# Patient Record
Sex: Female | Born: 1990 | Race: Black or African American | Hispanic: No | Marital: Single | Smoking: Current every day smoker
Health system: Southern US, Community
[De-identification: ages and names within clinical notes are randomized; demographics above are authoritative.]

## PROBLEM LIST (undated history)

## (undated) ENCOUNTER — Inpatient Hospital Stay (HOSPITAL_COMMUNITY): Payer: Self-pay

## (undated) DIAGNOSIS — Z8619 Personal history of other infectious and parasitic diseases: Secondary | ICD-10-CM

## (undated) DIAGNOSIS — A549 Gonococcal infection, unspecified: Secondary | ICD-10-CM

## (undated) DIAGNOSIS — D369 Benign neoplasm, unspecified site: Secondary | ICD-10-CM

## (undated) DIAGNOSIS — M51369 Other intervertebral disc degeneration, lumbar region without mention of lumbar back pain or lower extremity pain: Secondary | ICD-10-CM

## (undated) DIAGNOSIS — F329 Major depressive disorder, single episode, unspecified: Secondary | ICD-10-CM

## (undated) DIAGNOSIS — F419 Anxiety disorder, unspecified: Secondary | ICD-10-CM

## (undated) DIAGNOSIS — F3131 Bipolar disorder, current episode depressed, mild: Secondary | ICD-10-CM

## (undated) DIAGNOSIS — A749 Chlamydial infection, unspecified: Secondary | ICD-10-CM

## (undated) DIAGNOSIS — M542 Cervicalgia: Secondary | ICD-10-CM

## (undated) DIAGNOSIS — K429 Umbilical hernia without obstruction or gangrene: Secondary | ICD-10-CM

## (undated) DIAGNOSIS — E282 Polycystic ovarian syndrome: Secondary | ICD-10-CM

## (undated) DIAGNOSIS — M5136 Other intervertebral disc degeneration, lumbar region: Secondary | ICD-10-CM

## (undated) DIAGNOSIS — F39 Unspecified mood [affective] disorder: Secondary | ICD-10-CM

## (undated) DIAGNOSIS — F32A Depression, unspecified: Secondary | ICD-10-CM

## (undated) DIAGNOSIS — M5432 Sciatica, left side: Secondary | ICD-10-CM

## (undated) DIAGNOSIS — D279 Benign neoplasm of unspecified ovary: Secondary | ICD-10-CM

## (undated) HISTORY — DX: Polycystic ovarian syndrome: E28.2

---
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
## 2009-03-14 ENCOUNTER — Emergency Department (HOSPITAL_COMMUNITY): Admission: EM | Admit: 2009-03-14 | Discharge: 2009-03-14 | Payer: Self-pay | Admitting: Emergency Medicine

## 2009-05-22 ENCOUNTER — Emergency Department (HOSPITAL_BASED_OUTPATIENT_CLINIC_OR_DEPARTMENT_OTHER): Admission: EM | Admit: 2009-05-22 | Discharge: 2009-05-22 | Payer: Self-pay | Admitting: Emergency Medicine

## 2009-06-25 ENCOUNTER — Emergency Department (HOSPITAL_COMMUNITY): Admission: EM | Admit: 2009-06-25 | Discharge: 2009-06-25 | Payer: Self-pay | Admitting: Emergency Medicine

## 2009-08-25 ENCOUNTER — Emergency Department (HOSPITAL_COMMUNITY): Admission: EM | Admit: 2009-08-25 | Discharge: 2009-08-25 | Payer: Self-pay | Admitting: Emergency Medicine

## 2009-10-16 ENCOUNTER — Inpatient Hospital Stay (HOSPITAL_COMMUNITY): Admission: AD | Admit: 2009-10-16 | Discharge: 2009-10-16 | Payer: Self-pay | Admitting: Obstetrics and Gynecology

## 2010-01-03 ENCOUNTER — Emergency Department (HOSPITAL_COMMUNITY): Admission: EM | Admit: 2010-01-03 | Discharge: 2010-01-04 | Payer: Self-pay | Admitting: Emergency Medicine

## 2010-01-09 ENCOUNTER — Emergency Department (HOSPITAL_BASED_OUTPATIENT_CLINIC_OR_DEPARTMENT_OTHER): Admission: EM | Admit: 2010-01-09 | Discharge: 2010-01-10 | Payer: Self-pay | Admitting: Emergency Medicine

## 2010-01-12 ENCOUNTER — Emergency Department (HOSPITAL_COMMUNITY): Admission: EM | Admit: 2010-01-12 | Discharge: 2010-01-12 | Payer: Self-pay | Admitting: Emergency Medicine

## 2010-02-06 ENCOUNTER — Emergency Department (HOSPITAL_COMMUNITY): Admission: EM | Admit: 2010-02-06 | Discharge: 2010-02-06 | Payer: Self-pay | Admitting: Emergency Medicine

## 2010-03-26 ENCOUNTER — Emergency Department (HOSPITAL_COMMUNITY): Admission: EM | Admit: 2010-03-26 | Discharge: 2010-03-27 | Payer: Self-pay | Admitting: Emergency Medicine

## 2010-03-26 ENCOUNTER — Ambulatory Visit: Payer: Self-pay | Admitting: Psychiatry

## 2010-03-27 ENCOUNTER — Inpatient Hospital Stay (HOSPITAL_COMMUNITY): Admission: EM | Admit: 2010-03-27 | Discharge: 2010-04-03 | Payer: Self-pay | Admitting: Psychiatry

## 2010-07-25 ENCOUNTER — Emergency Department (HOSPITAL_BASED_OUTPATIENT_CLINIC_OR_DEPARTMENT_OTHER): Admission: EM | Admit: 2010-07-25 | Discharge: 2010-07-25 | Payer: Self-pay | Admitting: Emergency Medicine

## 2010-07-25 ENCOUNTER — Ambulatory Visit: Payer: Self-pay | Admitting: Interventional Radiology

## 2010-08-12 ENCOUNTER — Emergency Department (HOSPITAL_COMMUNITY): Admission: EM | Admit: 2010-08-12 | Discharge: 2010-08-13 | Payer: Self-pay | Admitting: Emergency Medicine

## 2010-10-23 ENCOUNTER — Emergency Department (HOSPITAL_COMMUNITY)
Admission: EM | Admit: 2010-10-23 | Discharge: 2010-10-23 | Payer: Self-pay | Source: Home / Self Care | Admitting: Emergency Medicine

## 2010-11-04 ENCOUNTER — Emergency Department (HOSPITAL_COMMUNITY)
Admission: EM | Admit: 2010-11-04 | Discharge: 2010-11-05 | Payer: Self-pay | Source: Home / Self Care | Admitting: Emergency Medicine

## 2011-01-19 LAB — WET PREP, GENITAL

## 2011-01-19 LAB — POCT URINALYSIS DIPSTICK
Bilirubin Urine: NEGATIVE
Urobilinogen, UA: 0.2 mg/dL (ref 0.0–1.0)
pH: 7 (ref 5.0–8.0)

## 2011-01-26 LAB — POCT PREGNANCY, URINE: Preg Test, Ur: NEGATIVE

## 2011-01-26 LAB — DIFFERENTIAL
Basophils Relative: 1 % (ref 0–1)
Monocytes Absolute: 0.7 10*3/uL (ref 0.1–1.0)
Neutro Abs: 8.2 10*3/uL — ABNORMAL HIGH (ref 1.7–7.7)
Neutrophils Relative %: 65 % (ref 43–77)

## 2011-01-26 LAB — BASIC METABOLIC PANEL
BUN: 6 mg/dL (ref 6–23)
Chloride: 106 mEq/L (ref 96–112)
GFR calc Af Amer: >60 mL/min (ref >60)
GFR calc non Af Amer: >60 mL/min (ref >60)
Potassium: 4.1 mEq/L (ref 3.5–5.1)
Sodium: 136 mEq/L (ref 135–145)

## 2011-01-26 LAB — RAPID URINE DRUG SCREEN, HOSP PERFORMED
Amphetamines: NOT DETECTED
Benzodiazepines: NOT DETECTED
Cocaine: NOT DETECTED
Tetrahydrocannabinol: NOT DETECTED

## 2011-01-26 LAB — URINALYSIS, ROUTINE W REFLEX MICROSCOPIC
Bilirubin Urine: NEGATIVE
Hgb urine dipstick: NEGATIVE
Specific Gravity, Urine: 1.02 (ref 1.005–1.030)
pH: 7 (ref 5.0–8.0)

## 2011-01-28 LAB — POCT PREGNANCY, URINE: Preg Test, Ur: NEGATIVE

## 2011-02-02 LAB — URINALYSIS, ROUTINE W REFLEX MICROSCOPIC
Bilirubin Urine: NEGATIVE
Nitrite: NEGATIVE
Specific Gravity, Urine: 1.007 (ref 1.005–1.030)
Urobilinogen, UA: 0.2 mg/dL (ref 0.0–1.0)
pH: 7 (ref 5.0–8.0)

## 2011-02-02 LAB — POCT I-STAT, CHEM 8
Creatinine, Ser: 0.6 mg/dL (ref 0.4–1.2)
HCT: 46 % (ref 36.0–46.0)
Hemoglobin: 15.6 g/dL — ABNORMAL HIGH (ref 12.0–15.0)
Potassium: 4.1 mEq/L (ref 3.5–5.1)
Sodium: 139 mEq/L (ref 135–145)
TCO2: 28 mmol/L (ref 0–100)

## 2011-02-10 LAB — WET PREP, GENITAL: Clue Cells Wet Prep HPF POC: NONE SEEN

## 2011-02-10 LAB — URINALYSIS, ROUTINE W REFLEX MICROSCOPIC
Bilirubin Urine: NEGATIVE
Hgb urine dipstick: NEGATIVE
Nitrite: NEGATIVE
Protein, ur: NEGATIVE mg/dL
Urobilinogen, UA: 0.2 mg/dL (ref 0.0–1.0)

## 2011-02-10 LAB — GC/CHLAMYDIA PROBE AMP, GENITAL: GC Probe Amp, Genital: NEGATIVE

## 2011-02-14 LAB — WET PREP, GENITAL
Trich, Wet Prep: NONE SEEN
Yeast Wet Prep HPF POC: NONE SEEN

## 2011-02-14 LAB — URINALYSIS, ROUTINE W REFLEX MICROSCOPIC
Ketones, ur: NEGATIVE mg/dL
Leukocytes, UA: NEGATIVE
Nitrite: NEGATIVE
Protein, ur: NEGATIVE mg/dL
Urobilinogen, UA: 0.2 mg/dL (ref 0.0–1.0)

## 2011-02-14 LAB — HERPES SIMPLEX VIRUS CULTURE: Culture: DETECTED

## 2011-02-14 LAB — PREGNANCY, URINE: Preg Test, Ur: NEGATIVE

## 2011-02-16 LAB — URINALYSIS, ROUTINE W REFLEX MICROSCOPIC
Bilirubin Urine: NEGATIVE
Glucose, UA: NEGATIVE mg/dL
Nitrite: NEGATIVE
Specific Gravity, Urine: 1.006 (ref 1.005–1.030)
pH: 7 (ref 5.0–8.0)

## 2011-02-16 LAB — PREGNANCY, URINE: Preg Test, Ur: NEGATIVE

## 2011-02-16 LAB — GLUCOSE, CAPILLARY: Glucose-Capillary: 88 mg/dL (ref 70–99)

## 2011-02-17 LAB — PREGNANCY, URINE: Preg Test, Ur: NEGATIVE

## 2011-02-17 LAB — WET PREP, GENITAL
Clue Cells Wet Prep HPF POC: NONE SEEN
Trich, Wet Prep: NONE SEEN

## 2011-02-17 LAB — URINALYSIS, ROUTINE W REFLEX MICROSCOPIC
Bilirubin Urine: NEGATIVE
Nitrite: NEGATIVE
Urobilinogen, UA: 0.2 mg/dL (ref 0.0–1.0)

## 2011-02-17 LAB — URINE MICROSCOPIC-ADD ON

## 2011-02-23 LAB — URINALYSIS, ROUTINE W REFLEX MICROSCOPIC
Glucose, UA: NEGATIVE mg/dL
Protein, ur: NEGATIVE mg/dL
Urobilinogen, UA: 0.2 mg/dL (ref 0.0–1.0)

## 2011-02-23 LAB — HERPES SIMPLEX VIRUS CULTURE

## 2011-02-23 LAB — GC/CHLAMYDIA PROBE AMP, GENITAL
Chlamydia, DNA Probe: NEGATIVE
GC Probe Amp, Genital: NEGATIVE

## 2011-02-23 LAB — URINE MICROSCOPIC-ADD ON

## 2011-02-23 LAB — WET PREP, GENITAL

## 2011-03-07 ENCOUNTER — Emergency Department (HOSPITAL_BASED_OUTPATIENT_CLINIC_OR_DEPARTMENT_OTHER)
Admission: EM | Admit: 2011-03-07 | Discharge: 2011-03-07 | Disposition: A | Discharge status: Home / Self Care | Payer: Self-pay | Attending: Emergency Medicine | Admitting: Emergency Medicine

## 2011-03-07 DIAGNOSIS — R109 Unspecified abdominal pain: Secondary | ICD-10-CM | POA: Insufficient documentation

## 2011-03-07 DIAGNOSIS — A499 Bacterial infection, unspecified: Secondary | ICD-10-CM | POA: Insufficient documentation

## 2011-03-07 DIAGNOSIS — B9689 Other specified bacterial agents as the cause of diseases classified elsewhere: Secondary | ICD-10-CM | POA: Insufficient documentation

## 2011-03-07 DIAGNOSIS — F17200 Nicotine dependence, unspecified, uncomplicated: Secondary | ICD-10-CM | POA: Insufficient documentation

## 2011-03-07 DIAGNOSIS — N760 Acute vaginitis: Secondary | ICD-10-CM | POA: Insufficient documentation

## 2011-03-07 DIAGNOSIS — N390 Urinary tract infection, site not specified: Secondary | ICD-10-CM | POA: Insufficient documentation

## 2011-03-07 DIAGNOSIS — F319 Bipolar disorder, unspecified: Secondary | ICD-10-CM | POA: Insufficient documentation

## 2011-03-07 DIAGNOSIS — N898 Other specified noninflammatory disorders of vagina: Secondary | ICD-10-CM | POA: Insufficient documentation

## 2011-03-07 LAB — URINALYSIS, ROUTINE W REFLEX MICROSCOPIC
Bilirubin Urine: NEGATIVE
Glucose, UA: NEGATIVE mg/dL
Ketones, ur: NEGATIVE mg/dL
pH: 6.5 (ref 5.0–8.0)

## 2011-03-07 LAB — URINE MICROSCOPIC-ADD ON

## 2011-03-07 LAB — WET PREP, GENITAL

## 2011-05-30 ENCOUNTER — Inpatient Hospital Stay (HOSPITAL_COMMUNITY): Payer: PRIVATE HEALTH INSURANCE

## 2011-05-30 ENCOUNTER — Encounter (HOSPITAL_COMMUNITY): Payer: Self-pay | Admitting: *Deleted

## 2011-05-30 ENCOUNTER — Inpatient Hospital Stay (HOSPITAL_COMMUNITY)
Admission: AD | Admit: 2011-05-30 | Discharge: 2011-06-02 | DRG: 761 | Disposition: A | Discharge status: Home / Self Care | Payer: PRIVATE HEALTH INSURANCE | Source: Ambulatory Visit | Attending: Obstetrics & Gynecology | Admitting: Obstetrics & Gynecology

## 2011-05-30 DIAGNOSIS — N7093 Salpingitis and oophoritis, unspecified: Secondary | ICD-10-CM

## 2011-05-30 DIAGNOSIS — N949 Unspecified condition associated with female genital organs and menstrual cycle: Secondary | ICD-10-CM

## 2011-05-30 DIAGNOSIS — N83209 Unspecified ovarian cyst, unspecified side: Secondary | ICD-10-CM

## 2011-05-30 HISTORY — DX: Anxiety disorder, unspecified: F41.9

## 2011-05-30 HISTORY — DX: Chlamydial infection, unspecified: A74.9

## 2011-05-30 HISTORY — DX: Depression, unspecified: F32.A

## 2011-05-30 HISTORY — DX: Bipolar disorder, current episode depressed, mild: F31.31

## 2011-05-30 HISTORY — DX: Major depressive disorder, single episode, unspecified: F32.9

## 2011-05-30 HISTORY — DX: Gonococcal infection, unspecified: A54.9

## 2011-05-30 LAB — DIFFERENTIAL
Basophils Absolute: 0.1 10*3/uL (ref 0.0–0.1)
Basophils Relative: 0 % (ref 0–1)
Eosinophils Absolute: 0.4 10*3/uL (ref 0.0–0.7)
Eosinophils Relative: 2 % (ref 0–5)
Monocytes Absolute: 0.8 10*3/uL (ref 0.1–1.0)

## 2011-05-30 LAB — URINALYSIS, ROUTINE W REFLEX MICROSCOPIC
Glucose, UA: NEGATIVE mg/dL
Ketones, ur: NEGATIVE mg/dL
pH: 7.5 (ref 5.0–8.0)

## 2011-05-30 LAB — CBC
HCT: 41.9 % (ref 36.0–46.0)
MCH: 28.8 pg (ref 26.0–34.0)
MCHC: 33.4 g/dL (ref 30.0–36.0)
MCV: 86.2 fL (ref 78.0–100.0)
RDW: 13.1 % (ref 11.5–15.5)

## 2011-05-30 LAB — WET PREP, GENITAL: Yeast Wet Prep HPF POC: NONE SEEN

## 2011-05-30 LAB — POCT PREGNANCY, URINE: Preg Test, Ur: NEGATIVE

## 2011-05-30 MED ORDER — ONDANSETRON HCL 4 MG/2ML IJ SOLN
4.0000 mg | Freq: Four times a day (QID) | INTRAMUSCULAR | Status: DC | PRN
  Administered 2011-05-31 – 2011-06-02 (×5): 4 mg via INTRAVENOUS
  Filled 2011-05-30 (×6): qty 2

## 2011-05-30 MED ORDER — LACTATED RINGERS IV SOLN
INTRAVENOUS | Status: DC | PRN
  Administered 2011-05-30: 20:00:00 via INTRAVENOUS

## 2011-05-30 MED ORDER — SODIUM CHLORIDE 0.9 % IJ SOLN
3.0000 mL | Freq: Two times a day (BID) | INTRAMUSCULAR | Status: DC
  Administered 2011-05-30 – 2011-06-02 (×5): 3 mL via INTRAVENOUS

## 2011-05-30 MED ORDER — DEXTROSE 5 % IV SOLN
1.0000 g | Freq: Two times a day (BID) | INTRAVENOUS | Status: DC
  Administered 2011-05-30 – 2011-06-01 (×5): 1 g via INTRAVENOUS
  Filled 2011-05-30 (×6): qty 1

## 2011-05-30 MED ORDER — ZOLPIDEM TARTRATE 5 MG PO TABS: 5.0000 mg | ORAL_TABLET | Freq: Every evening | ORAL | Status: DC | PRN

## 2011-05-30 MED ORDER — HYDROMORPHONE HCL 1 MG/ML IJ SOLN
1.0000 mg | INTRAMUSCULAR | Status: DC | PRN
  Administered 2011-05-30 – 2011-05-31 (×4): 1 mg via INTRAVENOUS
  Filled 2011-05-30 (×5): qty 1

## 2011-05-30 MED ORDER — ONDANSETRON 8 MG PO TBDP
8.0000 mg | ORAL_TABLET | Freq: Once | ORAL | Status: AC
  Administered 2011-05-30: 8 mg via ORAL
  Filled 2011-05-30: qty 1

## 2011-05-30 MED ORDER — DOXYCYCLINE HYCLATE 100 MG PO TABS
100.0000 mg | ORAL_TABLET | Freq: Two times a day (BID) | ORAL | Status: DC
  Administered 2011-05-30 – 2011-06-01 (×5): 100 mg via ORAL
  Filled 2011-05-30 (×6): qty 1

## 2011-05-30 MED ORDER — PRENATAL PLUS 27-1 MG PO TABS
1.0000 | ORAL_TABLET | Freq: Every day | ORAL | Status: DC
  Administered 2011-05-30 – 2011-06-02 (×4): 1 via ORAL
  Filled 2011-05-30 (×4): qty 1

## 2011-05-30 MED ORDER — SODIUM CHLORIDE 0.9 % IV SOLN
INTRAVENOUS | Status: DC
  Administered 2011-05-30 – 2011-05-31 (×2): via INTRAVENOUS

## 2011-05-30 MED ORDER — KETOROLAC TROMETHAMINE 60 MG/2ML IM SOLN
60.0000 mg | Freq: Once | INTRAMUSCULAR | Status: AC
  Administered 2011-05-30: 60 mg via INTRAMUSCULAR
  Filled 2011-05-30: qty 2

## 2011-05-30 MED ORDER — DIPHENHYDRAMINE HCL 25 MG PO CAPS
25.0000 mg | ORAL_CAPSULE | ORAL | Status: DC | PRN
  Administered 2011-05-30 – 2011-06-01 (×5): 25 mg via ORAL
  Filled 2011-05-30 (×5): qty 1

## 2011-05-30 NOTE — ED Notes (Signed)
Lab Tech at bedside and blood drawn for lab work.

## 2011-05-30 NOTE — Progress Notes (Signed)
PT REPORTS SHARP ABDOMINAL PAIN THIS AM TAKING MIDOL AND MOTRIN FOR PAIN RELIEF WITH MINIMAL RELIEF. Pt C/O VAGINAL BLEEDING.

## 2011-05-30 NOTE — Progress Notes (Signed)
Report called to Selena Batten, RN charge nurse [facility]

## 2011-05-30 NOTE — ED Notes (Signed)
Hope Neese RNP at bedside to discuss plan of care. Admission explained to pt.

## 2011-05-30 NOTE — Progress Notes (Signed)
Pt states she put a tampon and a peri pad on at 10 am this morning and states it was saturated in 2 hours with small blood clots. Pt placed a new peri pad at 1200,  and when RN assessed pad only spotting seen.

## 2011-05-30 NOTE — ED Provider Notes (Signed)
History   The patient states that approximately 4 am she had a severe pain in her lower abdomen followed by vaginal bleeding. She went to work later and the pain became increasingly worse. She sat down in the break room and felt her pants getting wet. When she went to the bathroom she had bled through a tampon, pad and her clothes. Her coworkers took her to the office and called 911. Pt. Arrived to [facility] via EMS and appears very uncomfortable. Current sex partner x 1 month. Hx of STI's in the past.  Chief Complaint  Patient presents with  . Vaginal Bleeding   Patient is a 20 y.o. female presenting with vaginal bleeding. The history is provided by the patient.  Vaginal Bleeding This is a new problem. The current episode started today. The problem has been gradually worsening. Associated symptoms include abdominal pain and nausea. Pertinent negatives include no chills or neck pain. The symptoms are aggravated by nothing. She has tried nothing for the symptoms.  Vaginal Bleeding This is a new problem. The current episode started today. The problem has been gradually worsening. Associated symptoms include abdominal pain, flank pain, frequency and nausea. Pertinent negatives include no chills or dysuria. The symptoms are aggravated by nothing. She has tried nothing for the symptoms.    Past Medical History  Diagnosis Date  . Anxiety   . Depression   . Chlamydia   . Gonorrhea     No past surgical history on file.  No family history on file.  History  Substance Use Topics  . Smoking status: Current Everyday Smoker -- 7.0 packs/day  . Smokeless tobacco: Not on file  . Alcohol Use: No    OB History    Grav Para Term Preterm Abortions TAB SAB Ect Mult Living   2    2  2    0      Review of Systems  Constitutional: Negative for chills and activity change.  HENT: Negative for facial swelling, neck pain and neck stiffness.   Eyes: Negative.   Gastrointestinal: Positive for nausea and  abdominal pain.  Genitourinary: Positive for frequency, flank pain and vaginal bleeding. Negative for dysuria.  Neurological: Negative.     Physical Exam  BP 107/61  Pulse 70  Resp 20  LMP 05/09/2011  Physical Exam  Nursing note and vitals reviewed. Constitutional: She is oriented to person, place, and time. She appears well-developed and well-nourished.  HENT:  Head: Normocephalic and atraumatic.  Eyes: EOM are normal.  Neck: Neck supple.  Pulmonary/Chest: Effort normal.  Abdominal: Soft. There is tenderness.  Genitourinary: Uterus is tender. Cervix exhibits motion tenderness. Right adnexum displays tenderness and fullness. Left adnexum displays tenderness and fullness.       Small amount of blood noted, vaginal vault.  Musculoskeletal: Normal range of motion.  Neurological: She is alert and oriented to person, place, and time. No cranial nerve deficit.  Skin: Skin is warm and dry.    ED Course  Procedures  MDM Patient ultrasound shows ? Dermoid 3.0 x 2.2 x 3.0cm right ovary and a complex mass 4.5 x 4.0 x 3.8 cm on the left and trace FF. I discussed lab, ultrasound and clinical findings with Dr. Marice Potter. The patient is at risk for TOA due to new sex partner of one month, history of STI's and elevated white blood cell count of 15.9. With these findings we will admit the patient for IV antibiotics and repeat her CBC in the am. Rocephin 1 gram  IV q 12 hours and doxycycline 100mg . Po bid.

## 2011-05-30 NOTE — ED Notes (Signed)
Report received from Joycelyn Man, RN with care accepted.

## 2011-05-31 LAB — COMPREHENSIVE METABOLIC PANEL
Albumin: 3.2 g/dL — ABNORMAL LOW (ref 3.5–5.2)
BUN: 10 mg/dL (ref 6–23)
Calcium: 8.9 mg/dL (ref 8.4–10.5)
Creatinine, Ser: 0.75 mg/dL (ref 0.50–1.10)
Total Protein: 7.3 g/dL (ref 6.0–8.3)

## 2011-05-31 LAB — CBC
HCT: 40.6 % (ref 36.0–46.0)
Hemoglobin: 13.2 g/dL (ref 12.0–15.0)
MCH: 28.1 pg (ref 26.0–34.0)
MCHC: 32.5 g/dL (ref 30.0–36.0)
RDW: 13.3 % (ref 11.5–15.5)

## 2011-05-31 LAB — HIV ANTIBODY (ROUTINE TESTING W REFLEX): HIV: NONREACTIVE

## 2011-05-31 LAB — DIFFERENTIAL
Basophils Relative: 0 % (ref 0–1)
Eosinophils Absolute: 0.4 10*3/uL (ref 0.0–0.7)
Monocytes Absolute: 1 10*3/uL (ref 0.1–1.0)
Monocytes Relative: 6 % (ref 3–12)

## 2011-05-31 LAB — RPR: RPR Ser Ql: NONREACTIVE

## 2011-05-31 LAB — HEPATITIS B SURFACE ANTIGEN: Hepatitis B Surface Ag: NEGATIVE

## 2011-05-31 LAB — HEPATITIS C ANTIBODY: HCV Ab: NEGATIVE

## 2011-05-31 MED ORDER — HYDROMORPHONE HCL 2 MG PO TABS
2.0000 mg | ORAL_TABLET | ORAL | Status: DC | PRN
  Administered 2011-05-31 – 2011-06-02 (×11): 2 mg via ORAL
  Filled 2011-05-31 (×12): qty 1

## 2011-05-31 NOTE — Progress Notes (Signed)
Subjective: Patient reports   Objective: I have reviewed patient's labs and notes and vitals.  She tells me she had GC last year and Chlamydia. She would like testing for "all" STIs.  Heart: rrr Lungs: CTAB Abd: NABS, ND, slightly tender with palpation.   Assessment/Plan: BL ovarian complex cysts and high WBC, suspicious for TOAs;  I will treat this nulliparous lady with IV ABX and will discharge home when pain and WBC are better.  LOS: 1 day    Amber Ray C. 05/31/2011, 5:57 AM

## 2011-06-01 LAB — GC/CHLAMYDIA PROBE AMP, GENITAL
Chlamydia, DNA Probe: NEGATIVE
GC Probe Amp, Genital: NEGATIVE

## 2011-06-01 LAB — HSV 2 ANTIBODY, IGG: HSV 2 Glycoprotein G Ab, IgG: 9.91 IV — ABNORMAL HIGH

## 2011-06-01 MED ORDER — KETOROLAC TROMETHAMINE 30 MG/ML IJ SOLN
30.0000 mg | Freq: Four times a day (QID) | INTRAMUSCULAR | Status: AC
  Administered 2011-06-01 – 2011-06-02 (×5): 30 mg via INTRAVENOUS
  Filled 2011-06-01 (×5): qty 1

## 2011-06-01 MED ORDER — NICOTINE 14 MG/24HR TD PT24
14.0000 mg | MEDICATED_PATCH | Freq: Every day | TRANSDERMAL | Status: DC
  Administered 2011-06-01 – 2011-06-02 (×2): 14 mg via TRANSDERMAL
  Filled 2011-06-01 (×3): qty 1

## 2011-06-01 MED ORDER — PROMETHAZINE HCL 25 MG/ML IJ SOLN
12.5000 mg | Freq: Once | INTRAMUSCULAR | Status: AC
  Administered 2011-06-01: 12.5 mg via INTRAVENOUS
  Filled 2011-06-01: qty 1

## 2011-06-01 MED ORDER — HYDROMORPHONE HCL 1 MG/ML IJ SOLN
1.0000 mg | Freq: Once | INTRAMUSCULAR | Status: AC
  Administered 2011-06-01: 1 mg via INTRAVENOUS
  Filled 2011-06-01: qty 1

## 2011-06-01 NOTE — Progress Notes (Signed)
Still treating pain and nausea, no fever.  VSS afebrile  NAD, pleasant Abd: mild lower abd tenderness, soft  Imp: PID TOA, mildly improved Plan: Cont ABX. Toradol, Nicotine patch

## 2011-06-01 NOTE — Progress Notes (Signed)
UR chart review completed.  

## 2011-06-02 LAB — CBC
Hemoglobin: 13.1 g/dL (ref 12.0–15.0)
MCHC: 32.8 g/dL (ref 30.0–36.0)
Platelets: 223 10*3/uL (ref 150–400)
RBC: 4.63 MIL/uL (ref 3.87–5.11)

## 2011-06-02 MED ORDER — CLINDAMYCIN PHOSPHATE 900 MG/50ML IV SOLN
900.0000 mg | Freq: Three times a day (TID) | INTRAVENOUS | Status: DC
  Administered 2011-06-02 (×2): 900 mg via INTRAVENOUS
  Filled 2011-06-02 (×3): qty 50

## 2011-06-02 MED ORDER — ACETAMINOPHEN 500 MG PO TABS: 1000.0000 mg | ORAL_TABLET | Freq: Four times a day (QID) | ORAL | Status: AC | PRN

## 2011-06-02 MED ORDER — PROMETHAZINE HCL 12.5 MG PO TABS: 12.5000 mg | ORAL_TABLET | Freq: Four times a day (QID) | ORAL | Status: AC | PRN

## 2011-06-02 MED ORDER — HYDROCODONE-IBUPROFEN 7.5-200 MG PO TABS: 1.0000 | ORAL_TABLET | Freq: Three times a day (TID) | ORAL | Status: AC | PRN

## 2011-06-02 MED ORDER — SODIUM CHLORIDE 0.9 % IV SOLN
2.0000 g | Freq: Four times a day (QID) | INTRAVENOUS | Status: DC
  Administered 2011-06-02 (×2): 2 g via INTRAVENOUS
  Filled 2011-06-02 (×4): qty 2000

## 2011-06-02 MED ORDER — NORETHINDRONE-ETH ESTRADIOL 1-35 MG-MCG PO TABS: 1.0000 | ORAL_TABLET | Freq: Every day | ORAL | Status: DC

## 2011-06-02 MED ORDER — GENTAMICIN SULFATE 40 MG/ML IJ SOLN
7.0000 mg/kg | INTRAVENOUS | Status: DC
  Administered 2011-06-02: 472 mg via INTRAVENOUS
  Filled 2011-06-02: qty 11.8

## 2011-06-02 NOTE — Progress Notes (Signed)
  Ultrasound reviewed with radiologist, findings consistent with bilateral ovarian cyst suspicious for hemorrhagic cyst. Patient report pain improved since admission but persistent. Findings explained to the patient and patient's mother and they are both agreeable with discharge home with plans to follow-up in clinic with repeat imaging. Discharge instructions provided.

## 2011-06-02 NOTE — Progress Notes (Signed)
Subjective: Patient reports pain is still severe.  The pain is 8/10 however she is asleep when the RN does her assessment and when she wakes up her pain is "severe"  Pt ambulates, voids, and is tolerating PO.    Objective: I have reviewed patient's vital signs, medications, labs and radiology results.  General: alert and cooperative GI: moderate pain in bilateral lower quadrants.  No rebound, no guarding Extremities: Homans sign is negative, no sign of DVT Vaginal Bleeding: minimal   Assessment/Plan:  20 yo female with bilateral ovarian masses on [place] and presumptive PID.  Given that pain is not improving, will change to Amp. Natasha Bence, [place].   There was a small study in up to date which found this regimen superior in some women.  Will also call rads and review films with them as they thought the lesions were more consistent with hemorrhagic cysts.  1.  Change abx as above 2.  Review films 3.  CBC 4.  SCDs   LOS: 3 days    Raevin Wierenga H. 06/02/2011, 6:15 AM

## 2011-06-02 NOTE — Discharge Summary (Signed)
Physician Discharge Summary  Patient ID: Amber Ray MRN: [id number] DOB/AGE: [birth year] 20 y.o.  Admit date: 05/30/2011 Discharge date: 06/02/2011  Admission Diagnoses: Presumed bilateral Tubo-ovarian abscess  Discharge Diagnoses: Bilateral ovarian cyst (hemorrhagic cyst suspected) Active Problems:  * No active hospital problems. *    Discharged Condition: stable  Hospital Course: Patient presented to [facility] for evaluation of pelvic pain and nausea. Radiologic imaging revealed findings suspicious for bilateral tubo-ovarian abscess. In the presence of an elevated WBC of 15 and these radiologic findings, patient was admitted and treated for presumed TOA with IV antibiotics. Throughout her hospitalization, the patient remained afebrile, WBC slowly normalized. Upon review of the ultrasound images with the radiologist, findings are most consistent with bilateral ovarian cyst consistent with hemorrhagic cysts. The patient was then found to be stable for discharge with plans to repeat the ultrasound in 6-8 weeks.  Consults: none  Significant Diagnostic Studies: as mentioned above   Treatments: antibiotics: gentamycin and ampicillin and clindamycin  Discharge Exam: Blood pressure 109/73, pulse 74, temperature 97.6 F (36.4 C), temperature source Oral, resp. rate 20, height 5\' 4"  (1.626 m), weight 86.807 kg (191 lb 6 oz), last menstrual period 05/07/2011, SpO2 97.00%. General appearance: alert and no distress GI: abnormal findings:  tendernes in lower abdomen, no rebound, no guarding  Disposition: Home or Self Care  Discharge Orders    Future Orders Please Complete By Expires   Diet Carb Modified      Discharge instructions      Comments:   You are expected to experience pain for as long as the cysts on your ovaries are present. The pain will gradually improve over time. Please alternate between the extra strength tylenol and vicoprofen to manage your pain. If the pain worsen with time,  please return to the maternity admissions unit. We will schedule a repeat ultrasound at your visit in our clinic in one month.   Increase activity slowly      Call MD for:  temperature >100.4      Call MD for:  persistant nausea and vomiting      Call MD for:  severe uncontrolled pain        Current Discharge Medication List    START taking these medications   Details  acetaminophen (TYLENOL) 500 MG tablet Take 2 tablets (1,000 mg total) by mouth every 6 (six) hours as needed for pain. Qty: 30 tablet, Refills: 2    HYDROcodone-ibuprofen (VICOPROFEN) 7.5-200 MG per tablet Take 1 tablet by mouth every 8 (eight) hours as needed for pain. Qty: 30 tablet, Refills: 0    norethindrone-ethinyl estradiol (ORTHO-NOVUM 1/35, 28,) 1-35 MG-MCG per tablet Take 1 tablet by mouth daily. Qty: 28 tablet, Refills: 4    promethazine (PHENERGAN) 12.5 MG tablet Take 1 tablet (12.5 mg total) by mouth every 6 (six) hours as needed for nausea. Qty: 30 tablet, Refills: 0      CONTINUE these medications which have NOT CHANGED   Details  APAP-Parabrom-Pyrilamine (MIDOL MAXIMUM STRENGTH PMS) 500-25-15 MG TABS Take by mouth.        STOP taking these medications     ibuprofen (ADVIL,MOTRIN) 200 MG tablet          Signed: Quinlan Vollmer 06/02/2011, 3:12 PM

## 2011-06-02 NOTE — Consult Note (Signed)
ANTIBIOTIC CONSULT NOTE - INITIAL  Pharmacy Consult for gentamcin Indication: TOA  Patient Measurements: Height: 5\' 4"  (162.6 cm) Weight: 191 lb 6 oz (86.807 kg) IBW/kg (Calculated) : 54.7  Adjusted Body Weight: 67 kg  Vital Signs: Temp: 97.8 F (36.6 C) (07/24 0552) Temp src: Oral (07/24 0552) BP: 92/60 mmHg (07/24 0552) Pulse Rate: 65  (07/24 0552) Intake/Output from previous day: 07/23 0701 - 07/24 0700 In: 960 [P.O.:960] Out: 1700 [Urine:1700] Intake/Output from this shift: I/O this shift: In: 120 [P.O.:120] Out: 100 [Urine:100]  Labs:  Basename 05/31/11 0534 05/30/11 1510  WBC 15.3* 15.9*  HGB 13.2 14.0  PLT 241 242  LABCREA -- --  CREATININE 0.75 --  CRCLEARANCE -- --   No results found for this basename: VANCOTROUGH:2,VANCOPEAK:2,VANCORANDOM:2,GENTTROUGH:2,GENTPEAK:2,GENTRANDOM:2,TOBRATROUGH:2,TOBRAPEAK:2,TOBRARND:2,AMIKACINPEAK:2,AMIKACINTROU:2,AMIKACIN:2, in the last 72 hours   Microbiology: Recent Results (from the past 720 hour(s))  WET PREP, GENITAL     Status: Abnormal   Collection Time   05/30/11  5:25 PM      Component Value Range Status Comment   Yeast, Wet Prep NONE SEEN  NONE SEEN  Final    Trich, Wet Prep NONE SEEN  NONE SEEN  Final    Clue Cells, Wet Prep NONE SEEN  NONE SEEN  Final    WBC, Wet Prep HPF POC FEW (*) NONE SEEN  Final FEW BACTERIA SEEN    Medical History: Past Medical History  Diagnosis Date  . Anxiety   . Chlamydia   . Gonorrhea   . Depression   . Bipolar affective disorder, depressed, mild     Medications:  Anti-infectives     Start     Dose/Rate Route Frequency Ordered Stop   05/30/11 2200   cefTRIAXone (ROCEPHIN) 1 g in dextrose 5 % 50 mL IVPB  Status:  Discontinued        1 g 100 mL/hr over 30 Minutes Intravenous Every 12 hours 05/30/11 1858 06/02/11 0626   05/30/11 2200   doxycycline (VIBRA-TABS) tablet 100 mg  Status:  Discontinued        100 mg Oral Every 12 hours 05/30/11 1858 06/02/11 0626          Assessment: Coverage for infection of pelvic source.  R/O TOA per RN  Goal of Therapy: Plan extended dose interval dosing q24h with 10hr post initial dose random gentamicin level to be less than or equal to 28mcg/ml.  Plan:  1.  Regimen of 480mg  gentamicin q24h 2.  Random gentamicin level to be checked 10hr after 1st dose 3.  Further gentamicin levels if therapy continued more than 4 days or as clinically indicated  Scarlett Presto 06/02/2011,6:43 AM

## 2011-06-15 ENCOUNTER — Emergency Department (HOSPITAL_COMMUNITY)
Admission: EM | Admit: 2011-06-15 | Discharge: 2011-06-15 | Disposition: A | Discharge status: Home / Self Care | Payer: Self-pay | Attending: Emergency Medicine | Admitting: Emergency Medicine

## 2011-06-15 ENCOUNTER — Encounter (HOSPITAL_COMMUNITY): Payer: Self-pay | Admitting: *Deleted

## 2011-06-15 DIAGNOSIS — N83209 Unspecified ovarian cyst, unspecified side: Secondary | ICD-10-CM | POA: Insufficient documentation

## 2011-06-15 DIAGNOSIS — F17200 Nicotine dependence, unspecified, uncomplicated: Secondary | ICD-10-CM | POA: Insufficient documentation

## 2011-06-15 DIAGNOSIS — R110 Nausea: Secondary | ICD-10-CM | POA: Insufficient documentation

## 2011-06-15 DIAGNOSIS — N949 Unspecified condition associated with female genital organs and menstrual cycle: Secondary | ICD-10-CM | POA: Insufficient documentation

## 2011-06-15 DIAGNOSIS — R1032 Left lower quadrant pain: Secondary | ICD-10-CM | POA: Insufficient documentation

## 2011-06-15 DIAGNOSIS — R1031 Right lower quadrant pain: Secondary | ICD-10-CM | POA: Insufficient documentation

## 2011-06-15 DIAGNOSIS — R109 Unspecified abdominal pain: Secondary | ICD-10-CM | POA: Insufficient documentation

## 2011-06-15 LAB — DIFFERENTIAL
Basophils Absolute: 0 10*3/uL (ref 0.0–0.1)
Basophils Relative: 0 % (ref 0–1)
Eosinophils Absolute: 0.4 10*3/uL (ref 0.0–0.7)
Monocytes Relative: 7 % (ref 3–12)
Neutro Abs: 5.4 10*3/uL (ref 1.7–7.7)
Neutrophils Relative %: 53 % (ref 43–77)

## 2011-06-15 LAB — CBC
Hemoglobin: 13.1 g/dL (ref 12.0–15.0)
MCH: 28.1 pg (ref 26.0–34.0)
MCHC: 32.8 g/dL (ref 30.0–36.0)
Platelets: 237 10*3/uL (ref 150–400)
RDW: 13.1 % (ref 11.5–15.5)

## 2011-06-15 LAB — PREGNANCY, URINE: Preg Test, Ur: NEGATIVE

## 2011-06-15 LAB — URINALYSIS, ROUTINE W REFLEX MICROSCOPIC
Glucose, UA: NEGATIVE mg/dL
Ketones, ur: NEGATIVE mg/dL
Leukocytes, UA: NEGATIVE
Nitrite: NEGATIVE
Protein, ur: NEGATIVE mg/dL

## 2011-06-15 MED ORDER — MORPHINE SULFATE 10 MG/ML IJ SOLN
8.0000 mg | Freq: Once | INTRAMUSCULAR | Status: AC
  Administered 2011-06-15: 8 mg via INTRAMUSCULAR
  Filled 2011-06-15: qty 1

## 2011-06-15 MED ORDER — OXYCODONE-ACETAMINOPHEN 5-325 MG PO TABS: 2.0000 | ORAL_TABLET | ORAL | Status: AC | PRN

## 2011-06-15 MED ORDER — ONDANSETRON 4 MG PO TBDP
4.0000 mg | ORAL_TABLET | Freq: Once | ORAL | Status: AC
  Administered 2011-06-15: 4 mg via ORAL
  Filled 2011-06-15: qty 1

## 2011-06-15 NOTE — ED Notes (Signed)
Bilateral lower abd pain, [facility] diagnosed with ovarian cyst, pain feels the same,

## 2011-06-15 NOTE — ED Notes (Signed)
Pt self ambulated out with a steady gait 

## 2011-06-15 NOTE — ED Provider Notes (Signed)
Scribed for Geoffery Lyons, MD, the patient was seen in room 8. This chart was scribed by Jannette Fogo. This patient's care was started at 18:00.    CSN: [id number] Arrival date & time: 06/15/2011  5:38 PM  Chief Complaint  Patient presents with  . Abdominal Pain   HPI Amber Ray is a 20 y.o. female who presents to the Emergency Department complaining of 2-3 weeks of lower abdominal pain. [facility] for severe pelvic pain and heavy vaginal bleeding. Radiologic imaging at that time revealed findings suspicious for bilateral tubo-ovarian abscess. While in the hospital she had an elevated WBC of 15 and received IV antibiotics. Upon review of the ultrasound images with the radiologist, findings were most consistent with bilateral ovarian cyst consistent with hemorrhagic cysts. Patient was then discharged 06/02/2011 and scheduled for a follow up ultrasound in 6-8 weeks. She thought the follow up US was scheduled for tomorrow 06/16/2011, but found out it was scheduled for 07/17/2011. Patient says as of last night the pain got worse was "out of control." She describes it as "shooting" pelvic pain with associated nausea which is exacerbated by walking and eating. Has been taking Vicodin but reports no relief. Patient denies any current vaginal bleeding, dysuria, problems urinating, or a chance of possible pregnancy. LNMP 05/07/2011. There are no other associated symptoms and no other alleviating or aggravating factors.     Past Medical History  Diagnosis Date  . Anxiety   . Chlamydia   . Gonorrhea   . Depression   . Bipolar affective disorder, depressed, mild     Past Surgical History  Procedure Date  . No past surgeries     MEDICATIONS:  Previous Medications   ACETAMINOPHEN (TYLENOL) 500 MG TABLET    Take 500 mg by mouth once as needed. For breakthrough pain  in between taking Vicodin 7.5/500    APAP-PARABROM-PYRILAMINE (MIDOL MAXIMUM STRENGTH PMS)  500-25-15 MG TABS    Take 1 tablet by mouth as needed. For cramping   HYDROCODONE-ACETAMINOPHEN (LORTAB) 7.5-500 MG PER TABLET    Take 1 tablet by mouth every 4 (four) hours as needed. For pain    HYDROCODONE-ACETAMINOPHEN (VICODIN PO)    Take 1 tablet by mouth every 4 (four) hours as needed. For pain   NORETHINDRONE-ETHINYL ESTRADIOL (ORTHO-NOVUM 1/35, 28,) 1-35 MG-MCG PER TABLET    Take 1 tablet by mouth daily.     ALLERGIES:  Allergies as of 06/15/2011  . (No Known Allergies)     Family History  Problem Relation Age of Onset  . Kidney disease Mother   . Diabetes Maternal Grandmother   . Arthritis Maternal Grandmother   . Arthritis Maternal Grandfather   . Diabetes Paternal Grandmother   . Hypertension Paternal Grandmother   . Arthritis Paternal Grandmother   . Kidney disease Paternal Grandmother   . Arthritis Paternal Grandfather     History  Substance Use Topics  . Smoking status: Current Everyday Smoker -- 7.0 packs/day  . Smokeless tobacco: Not on file  . Alcohol Use: No    OB History    Grav Para Term Preterm Abortions TAB SAB Ect Mult Living   2    2  2    0      Review of Systems  Constitutional: Negative.  Negative for fever and chills.  HENT: Negative.   Respiratory: Negative.   Cardiovascular: Negative.   Gastrointestinal: Positive for nausea and abdominal pain. Negative for diarrhea.  Genitourinary:  Positive for pelvic pain. Negative for dysuria, urgency, frequency, hematuria, vaginal bleeding and difficulty urinating.  Musculoskeletal: Negative.  Negative for back pain.  Skin: Negative.   Neurological: Negative.   Hematological: Negative.   Psychiatric/Behavioral: Negative.   All other systems reviewed and are negative.   Physical Exam  BP 116/96  Pulse 86  Temp(Src) 98.5 F (36.9 C) (Oral)  Resp 20  Ht 5\' 4"  (1.626 m)  Wt 192 lb (87.091 kg)  BMI 32.96 kg/m2  SpO2 100%  LMP 05/07/2011   Physical Exam  Constitutional: She is oriented to  person, place, and time. She appears well-developed and well-nourished. No distress.  HENT:  Head: Normocephalic and atraumatic.  Mouth/Throat: Oropharynx is clear and moist.  Eyes: Conjunctivae are normal. Pupils are equal, round, and reactive to light.  Neck: Normal range of motion. Neck supple.  Cardiovascular: Normal rate, regular rhythm, normal heart sounds and intact distal pulses.   Pulmonary/Chest: Effort normal and breath sounds normal.  Abdominal: Bowel sounds are normal. There is tenderness (LLQ, RLQ, and suprapublic. ). There is no rebound, no guarding and no CVA tenderness.  Musculoskeletal: Normal range of motion. She exhibits no edema and no tenderness.  Neurological: She is alert and oriented to person, place, and time.  Skin: Skin is warm and dry.  Psychiatric: She has a normal mood and affect.   OTHER DATA REVIEWED: Nursing notes, vital signs, and past medical records reviewed. 05/30/11  TRANSABDOMINAL AND TRANSVAGINAL ULTRASOUND OF PELVIS: IMPRESSION:   1.  Bilateral complex cystic ovarian lesions measuring 3 cm on the right and 4.5 cm on the left. Both have indeterminate, but probably benign characteristics, with differential diagnoses discussed above.  Recommend follow-up by ultrasound in 6-12 weeks.  2.  No evidence of fibroids.     DIAGNOSTIC STUDIES: Oxygen Saturation is 100% on room air, normal by my interpretation.     LABS / RADIOLOGY:  Results for orders placed during the hospital encounter of 06/15/11  URINALYSIS, ROUTINE W REFLEX MICROSCOPIC      Component Value Range   Color, Urine YELLOW  YELLOW    Appearance CLEAR  CLEAR    Specific Gravity, Urine >1.030 (*) 1.005 - 1.030    pH 6.0  5.0 - 8.0    Glucose, UA NEGATIVE  NEGATIVE (mg/dL)   Hgb urine dipstick NEGATIVE  NEGATIVE    Bilirubin Urine NEGATIVE  NEGATIVE    Ketones, ur NEGATIVE  NEGATIVE (mg/dL)   Protein, ur NEGATIVE  NEGATIVE (mg/dL)   Urobilinogen, UA 0.2  0.0 - 1.0 (mg/dL)   Nitrite  NEGATIVE  NEGATIVE    Leukocytes, UA NEGATIVE  NEGATIVE   PREGNANCY, URINE      Component Value Range   Preg Test, Ur NEGATIVE    CBC      Component Value Range   WBC 10.2  4.0 - 10.5 (K/uL)   RBC 4.67  3.87 - 5.11 (MIL/uL)   Hemoglobin 13.1  12.0 - 15.0 (g/dL)   HCT 62.9  52.8 - 41.3 (%)   MCV 85.4  78.0 - 100.0 (fL)   MCH 28.1  26.0 - 34.0 (pg)   MCHC 32.8  30.0 - 36.0 (g/dL)   RDW 24.4  01.0 - 27.2 (%)   Platelets 237  150 - 400 (K/uL)  DIFFERENTIAL      Component Value Range   Neutrophils Relative 53  43 - 77 (%)   Neutro Abs 5.4  1.7 - 7.7 (K/uL)   Lymphocytes Relative 36  12 - 46 (%)   Lymphs Abs 3.7  0.7 - 4.0 (K/uL)   Monocytes Relative 7  3 - 12 (%)   Monocytes Absolute 0.7  0.1 - 1.0 (K/uL)   Eosinophils Relative 4  0 - 5 (%)   Eosinophils Absolute 0.4  0.0 - 0.7 (K/uL)   Basophils Relative 0  0 - 1 (%)   Basophils Absolute 0.0  0.0 - 0.1 (K/uL)   US Pelvic: Unable to be obtained tonight, will schedule as outpatient.      MDM: Differential Diagnosis: TOA vs. Ovarian cyst.    IMPRESSION: Abdominal pain, unspecified site  Other and unspecified ovarian cyst    PLAN:  Home  The patient is to return the emergency department if there is any worsening of symptoms. I have reviewed the discharge instructions with the patient.    CONDITION ON DISCHARGE: Stable   MEDICATIONS GIVEN IN THE E.D.  Medications  Hydrocodone-Acetaminophen (VICODIN PO) (not administered)  HYDROcodone-acetaminophen (LORTAB) 7.5-500 MG per tablet (not administered)  acetaminophen (TYLENOL) 500 MG tablet (not administered)  oxyCODONE-acetaminophen (PERCOCET) 5-325 MG per tablet (not administered)  morphine injection 8 mg (8 mg Intramuscular Given 06/15/11 1837)  ondansetron (ZOFRAN-ODT) disintegrating tablet 4 mg (4 mg Oral Given 06/15/11 2015)     DISCHARGE MEDICATIONS: New Prescriptions   OXYCODONE-ACETAMINOPHEN (PERCOCET) 5-325 MG PER TABLET    Take 2 tablets by mouth every 4  (four) hours as needed for pain.   I personally performed the services described in this documentation, which was scribed in my presence. The recorded information has been reviewed and considered. No att. providers found   Procedures       Geoffery Lyons, MD 06/16/11 [phone]

## 2011-06-15 NOTE — ED Notes (Signed)
Pt resting in bed in room no noted distress pt drinking water stating mild nausea

## 2011-06-16 ENCOUNTER — Inpatient Hospital Stay (HOSPITAL_COMMUNITY)
Admission: AD | Admit: 2011-06-16 | Discharge: 2011-06-16 | Disposition: A | Discharge status: Home / Self Care | Payer: Medicaid Other | Source: Ambulatory Visit | Attending: Obstetrics & Gynecology | Admitting: Obstetrics & Gynecology

## 2011-06-16 ENCOUNTER — Inpatient Hospital Stay (HOSPITAL_COMMUNITY): Payer: Medicaid Other

## 2011-06-16 DIAGNOSIS — N83201 Unspecified ovarian cyst, right side: Secondary | ICD-10-CM

## 2011-06-16 DIAGNOSIS — R109 Unspecified abdominal pain: Secondary | ICD-10-CM | POA: Insufficient documentation

## 2011-06-16 DIAGNOSIS — N83209 Unspecified ovarian cyst, unspecified side: Secondary | ICD-10-CM

## 2011-06-16 MED ORDER — MORPHINE SULFATE 10 MG/ML IJ SOLN
8.0000 mg | Freq: Once | INTRAMUSCULAR | Status: AC
  Administered 2011-06-16: 17:00:00 via INTRAMUSCULAR
  Filled 2011-06-16: qty 1

## 2011-06-16 MED ORDER — NORGESTIMATE-ETH ESTRADIOL 0.25-35 MG-MCG PO TABS: 1.0000 | ORAL_TABLET | Freq: Every day | ORAL | Status: DC

## 2011-06-16 MED ORDER — OXYCODONE-ACETAMINOPHEN 5-325 MG PO TABS: 2.0000 | ORAL_TABLET | ORAL | Status: AC | PRN

## 2011-06-16 MED ORDER — OXYCODONE-ACETAMINOPHEN 5-325 MG PO TABS: 2.0000 | ORAL_TABLET | ORAL | Status: DC | PRN

## 2011-06-16 MED ORDER — DICLOFENAC SODIUM 75 MG PO TBEC: 75.0000 mg | DELAYED_RELEASE_TABLET | Freq: Two times a day (BID) | ORAL | Status: DC

## 2011-06-16 MED ORDER — DIPHENHYDRAMINE HCL 25 MG PO CAPS
25.0000 mg | ORAL_CAPSULE | Freq: Once | ORAL | Status: AC
  Administered 2011-06-16: 25 mg via ORAL
  Filled 2011-06-16: qty 1

## 2011-06-16 NOTE — ED Provider Notes (Signed)
Arrival date & time: 06/16/2011  4:15 PM  Chief Complaint  Patient presents with  . Abdominal Pain   Abdominal Pain Associated symptoms include nausea. Pertinent negatives include no diarrhea, dysuria, fever, frequency or hematuria.  Patient is a 20 y.o. female presenting with abdominal pain.  Abdominal Pain The primary symptoms of the illness include abdominal pain and nausea. The primary symptoms of the illness do not include fever, diarrhea, dysuria or vaginal bleeding.  Symptoms associated with the illness do not include chills, urgency, hematuria, frequency or back pain.   Amber Ray is a 20 y.o. female who [facility] lower [facility] pain. [facility] for severe pelvic pain and heavy vaginal bleeding. Radiologic imaging at that time revealed findings suspicious for bilateral tubo-ovarian abscess. While in the hospital she had an elevated WBC of 15 and received IV antibiotics. Upon review of the ultrasound images with the radiologist, findings were most consistent with bilateral ovarian cyst consistent with hemorrhagic cysts. Patient was then discharged 06/02/2011 and scheduled for a follow up ultrasound in 6-8 weeks. She thought the follow up US was scheduled for tomorrow 06/16/2011, but found out it was scheduled for 07/17/2011. States that pain has been ongoing since discharge, has never gotten any better. Patient says as of two nights ago the pain got worse was "out of control." She describes it as "shooting" pelvic pain with associated nausea which is exacerbated by walking and eating. Has been taking Vicodin but reports no relief. She was seen at [facility] ED last night for the same c/o - was not able to have an u/s at that time, but received Morphine and had some pain relief. Was given rx for Percocet, but did not get filled today. Patient denies any current vaginal bleeding, dysuria, problems urinating, or a chance  of possible pregnancy. LNMP 05/07/2011. There are no other associated symptoms and no other alleviating or aggravating factors.     Past Medical History  Diagnosis Date  . Anxiety   . Chlamydia   . Gonorrhea   . Depression   . Bipolar affective disorder, depressed, mild     Past Surgical History  Procedure Date  . No past surgeries     MEDICATIONS:  Previous Medications   ACETAMINOPHEN (TYLENOL) 500 MG TABLET    Take 500 mg by mouth once as needed. For breakthrough pain  in between taking Vicodin 7.5/500    APAP-PARABROM-PYRILAMINE (MIDOL MAXIMUM STRENGTH PMS) 500-25-15 MG TABS    Take 1 tablet by mouth as needed. For cramping   HYDROCODONE-ACETAMINOPHEN (LORTAB) 7.5-500 MG PER TABLET    Take 1 tablet by mouth every 4 (four) hours as needed. For pain    HYDROCODONE-ACETAMINOPHEN (VICODIN PO)    Take 1 tablet by mouth every 4 (four) hours as needed. For pain   NORETHINDRONE-ETHINYL ESTRADIOL (ORTHO-NOVUM 1/35, 28,) 1-35 MG-MCG PER TABLET    Take 1 tablet by mouth daily.   OXYCODONE-ACETAMINOPHEN (PERCOCET) 5-325 MG PER TABLET    Take 2 tablets by mouth every 4 (four) hours as needed for pain.     ALLERGIES:  Allergies as of 06/16/2011  . (No Known Allergies)     Family History  Problem Relation Age of Onset  . Kidney disease Mother   . Diabetes Maternal Grandmother   . Arthritis Maternal Grandmother   . Arthritis Maternal Grandfather   . Diabetes Paternal Grandmother   . Hypertension Paternal Grandmother   .  Arthritis Paternal Grandmother   . Kidney disease Paternal Grandmother   . Arthritis Paternal Grandfather     History  Substance Use Topics  . Smoking status: Current Everyday Smoker -- 7.0 packs/day  . Smokeless tobacco: Not on file  . Alcohol Use: No    OB History    Grav Para Term Preterm Abortions TAB SAB Ect Mult Living   2    2  2    0      Review of Systems  Constitutional: Negative.  Negative for fever and chills.  HENT: Negative.   Respiratory:  Negative.   Cardiovascular: Negative.   Gastrointestinal: Positive for nausea and abdominal pain. Negative for diarrhea.  Genitourinary: Positive for pelvic pain. Negative for dysuria, urgency, frequency, hematuria, vaginal bleeding and difficulty urinating.  Musculoskeletal: Negative.  Negative for back pain.  Skin: Negative.   Neurological: Negative.   Hematological: Negative.   Psychiatric/Behavioral: Negative.   All other systems reviewed and are negative.   Physical Exam  BP 116/96  Pulse 86  Temp(Src) 98.5 F (36.9 C) (Oral)  Resp 20  Ht 5\' 4"  (1.626 m)  Wt 192 lb (87.091 kg)  BMI 32.96 kg/m2  SpO2 100%  LMP 05/07/2011   Physical Exam  Nursing note and vitals reviewed. Constitutional: She is oriented to person, place, and time. She appears well-developed and well-nourished. No distress.  Cardiovascular: Normal rate.   Pulmonary/Chest: Effort normal.  Abdominal: There is tenderness (LLQ, RLQ, and suprapublic. ). There is no rebound, no guarding and no CVA tenderness.  Musculoskeletal: Normal range of motion.  Neurological: She is alert and oriented to person, place, and time.  Skin: Skin is warm and dry.  Psychiatric: She has a normal mood and affect.      LABS / RADIOLOGY:  Results for orders placed during the hospital encounter of 06/15/11  URINALYSIS, ROUTINE W REFLEX MICROSCOPIC      Component Value Range   Color, Urine YELLOW  YELLOW    Appearance CLEAR  CLEAR    Specific Gravity, Urine >1.030 (*) 1.005 - 1.030    pH 6.0  5.0 - 8.0    Glucose, UA NEGATIVE  NEGATIVE (mg/dL)   Hgb urine dipstick NEGATIVE  NEGATIVE    Bilirubin Urine NEGATIVE  NEGATIVE    Ketones, ur NEGATIVE  NEGATIVE (mg/dL)   Protein, ur NEGATIVE  NEGATIVE (mg/dL)   Urobilinogen, UA 0.2  0.0 - 1.0 (mg/dL)   Nitrite NEGATIVE  NEGATIVE    Leukocytes, UA NEGATIVE  NEGATIVE   PREGNANCY, URINE      Component Value Range   Preg Test, Ur NEGATIVE    CBC      Component Value Range   WBC  10.2  4.0 - 10.5 (K/uL)   RBC 4.67  3.87 - 5.11 (MIL/uL)   Hemoglobin 13.1  12.0 - 15.0 (g/dL)   HCT 16.1  09.6 - 04.5 (%)   MCV 85.4  78.0 - 100.0 (fL)   MCH 28.1  26.0 - 34.0 (pg)   MCHC 32.8  30.0 - 36.0 (g/dL)   RDW 40.9  81.1 - 91.4 (%)   Platelets 237  150 - 400 (K/uL)  DIFFERENTIAL      Component Value Range   Neutrophils Relative 53  43 - 77 (%)   Neutro Abs 5.4  1.7 - 7.7 (K/uL)   Lymphocytes Relative 36  12 - 46 (%)   Lymphs Abs 3.7  0.7 - 4.0 (K/uL)   Monocytes Relative 7  3 -  12 (%)   Monocytes Absolute 0.7  0.1 - 1.0 (K/uL)   Eosinophils Relative 4  0 - 5 (%)   Eosinophils Absolute 0.4  0.0 - 0.7 (K/uL)   Basophils Relative 0  0 - 1 (%)   Basophils Absolute 0.0  0.0 - 0.1 (K/uL)   US Transvaginal Non-ob  05/30/2011  *RADIOLOGY REPORT*  Clinical Data: Right-sided pelvic pain.  Vaginal bleeding.  LMP 05/07/2011.  TRANSABDOMINAL AND TRANSVAGINAL ULTRASOUND OF PELVIS  Technique:  Both transabdominal and transvaginal ultrasound examinations of the pelvis were performed.  Transabdominal technique was performed for global imaging of the pelvis including uterus, ovaries, adnexal regions, and pelvic cul-de-sac.  It was necessary to proceed with endovaginal exam following the transabdominal exam to visualize the endometrium and bilateral adnexal cystic masses.  Comparison:  CT on 01/17/2007  Findings: Uterus:  7.4 x 3.3 x 4.8 cm.  No fibroids or other uterine masses identified.  Endometrium:  Double layer thickness measures 7 mm transvaginally. Minimal fluid noted in the lower uterine segment, which is of uncertain etiology and significance.  Right ovary:  4.6 x 2.4 x 3.7 cm.  A complex lesion with cystic and solid hyperechoic elements areseen measuring 3.0 x 2.2 x 3.0 cm. No definite internal blood flow is seen.  This could represent an ovarian dermoid or hemorrhagic cyst.  Left ovary:  6.2 x 4.7 x 4.9 cm.  A complex cystic lesion is seen which has multiple thin internal septations and  reticular echoes. This measures 4.5 x 4.0 x 3.8 cm, and shows no evidence of internal blood flow.  Differential diagnosis includes hemorrhagic cyst, endometrioma, or less likely cystic neoplasm.  Other findings:  No free fluid.  IMPRESSION:  1.  Bilateral complex cystic ovarian lesions measuring 3 cm on the right and 4.5 cm on the left. Both have indeterminate, but probably benign characteristics, with differential diagnoses discussed above.  Recommend follow-up by ultrasound in 6-12 weeks. 2.  No evidence of fibroids.  This recommendation follows the consensus statement:  Management of Asymptomatic Ovarian and Other Adnexal Cysts Imaged at [place]:  Society of Radiologists in Ultrasound Consensus Conference Statement. Radiology 2010; [phone].  Available online at: [email or web address].[email or web address].info/.  Original Report Authenticated By: Danae Orleans, M.D.   Pt states that they gave her Morphine last night and it worked well.  However, she says she gets itching about 45 minutes after she has been given with Morphine or Dilaudid and they always give her Benadryl, which helps.  She says the PO works better than the injection.  Pt states that she can go on to ultrasound and will be OK to get her Benadryl when she returns.  Pt states she had an appointment at the GYN clinic on 8/7 for follow up and decision to be made about surgery- she was to have ultrasound to re- evaluate. Pt then was told that her appointment was not 8/7 but 9/7.   Pt has not started her BCP because she does not have insurance and cannot afford the pills, which were $30.       *RADIOLOGY REPORT*  Clinical Data: Ovarian cysts. Worsening pain.  TRANSVAGINAL ULTRASOUND OF PELVIS  Technique: Transvaginal ultrasound examination of the pelvis was  performed including evaluation of the uterus, ovaries, adnexal  regions, and pelvic cul-de-sac.  Comparison: 05/30/2011  Findings:  Uterus: Measures 7.6 x 3.1 x 4.1 cm and appears  unremarkable.  Endometrium: Measures 1.3 cm, potentially with some blood products  seen along the endometrium, query  late secretory phase. The  patient had a negative pregnancy test yesterday.  Right ovary: Measures 4.5 x 2.6 x 3.2 cm and contains a central  high echogenicity component with marginal low echogenicity  measuring 2.8 x 2.4 x 2.8 cm. This is a very similar morphology  and appearance to the prior exam, but does not appeared have  internal flow on power Doppler evaluation. There does appear to be  some power Doppler flow in the adjacent ovarian parenchyma.  Left ovary: Measures 4.9 x 2.5 x 3.0 cm, with resolution of the  previously seen cystic lesion.  Other Findings: There is a trace amount of free pelvic fluid.  IMPRESSION:  1. Stable appearance of central lesion in the right ovary. The  higher echogenicity component could conceivably represent a  dermoid, although no fatty component of the right ovary was visible  on the prior CT scan from 01/17/2007. A complex/hemorrhagic cystic  lesion could have this appearance, although the stable appearance  over the past 2 weeks would be slightly unusual. MRI of the pelvis  could provide more definitive characterization, if clinically  warranted.  2. Resolution of the previous left ovarian lesion, although the  left ovary remains mildly prominent.  3. Slight prominence of the endometrial tissues, suspicious for  late secretory phase.  Original Report Authenticated By: Dellia Cloud, M.D.  Discussed with Dr. Macon Large- ultrasound showed improvement- pt can go home with percocet and Diclofenac and f/u in GYN clinic as scheduled; pt to start BCP- will change to Sprintec  MEDICATIONS GIVEN IN THE E.D.  Medications  morphine injection 8 mg (not administered)     DISCHARGE MEDICATIONS: New Prescriptions   No medications on file    Procedures

## 2011-06-16 NOTE — Progress Notes (Signed)
Pt states she has 2 ovarian cysts one on each side.  that causes her extreme pain . Pt states she has an abcess on her left side on the cyst. Pt states the pain is worse on that side. Pt  Can't work with this pain.Pt was told to follow up.Pt states called yesterday to see if she could come in early and was told that she didn't  Have an appointment until September.Pt visitor states that there is no way patient can wait until Sept.

## 2011-06-16 NOTE — Progress Notes (Signed)
Pt states she was recently hospitalized for ovarian cysts for about one week. Was scheduled for a recheck at the [facility] and thought her appointment was 8-6 but when she called it is not until September. Pt states she could not wait until then due to the pain and went to [facility] ER last night. Was given IM Morphine, states the [facility] is no longer helping. Instructed at [facility] to come to [facility] today.

## 2011-07-17 ENCOUNTER — Ambulatory Visit (INDEPENDENT_AMBULATORY_CARE_PROVIDER_SITE_OTHER): Payer: Self-pay | Admitting: Obstetrics & Gynecology

## 2011-07-17 ENCOUNTER — Encounter: Payer: Self-pay | Admitting: Obstetrics & Gynecology

## 2011-07-17 VITALS — BP 118/78 | HR 93 | Temp 97.1°F | Wt 193.4 lb

## 2011-07-17 DIAGNOSIS — R1033 Periumbilical pain: Secondary | ICD-10-CM

## 2011-07-17 DIAGNOSIS — R109 Unspecified abdominal pain: Secondary | ICD-10-CM

## 2011-07-17 DIAGNOSIS — N83209 Unspecified ovarian cyst, unspecified side: Secondary | ICD-10-CM

## 2011-07-17 MED ORDER — NAPROXEN 500 MG PO TABS: 500.0000 mg | ORAL_TABLET | Freq: Two times a day (BID) | ORAL | Status: DC

## 2011-07-17 NOTE — Progress Notes (Signed)
Subjective: Since discharge pt has been having continued intermittent lower abdominal pain, more on the left than the right.  Currently 2-3 days per week.  Severity is variable.  Did visit ED on 8/7 and had US performed at that time.  This showed resolution of left sided cyst but continued presence of right sided one.  For the past two weeks she has been having some pain with intercourse.  Pain is lower abdominal, bilateral, and is intermittent.  LMP 8/20.  Has been having periods that are normal for her since discharge.  Pt is also worried about a hernia as her umbilicus has been bulging out some of the time for the past two weeks..  When it does this, it is very tender but no erythema, constipation or diarrhea.  Objective:  Filed Vitals:   07/17/11 1023  BP: 118/78  Pulse: 93  Temp: 97.1 F (36.2 C)   Gen: NAD Abd: Superior portion of the umbilicus has slight defect in the fascia.  This area is very tender but there is no erythema.  Bilateral lower abdominal tenderness but no masses. Pelvic: Normal external genitalia.  Normal vaginal mucosa.  No significant discharge.  Normal cervix.  No cervical motion tenderness.  Mild bilateral adnexal tenderness but no masses.  Normal uterus.  Fundus firm.  Assessment/Plan: 1) Will obtain repeat abdominal imaging as per #2 to evaluate for resolution of cysts.  Naproxen scheduled for the next six weeks.  RTC 6 weeks. 2) Umbilical findings are slightly concerning for hernia.  Pt does not have a regular PCP.  Will obtain CT abd/pelvis to evaluate for hernia.

## 2011-07-17 NOTE — Patient Instructions (Signed)
It was great to see you today! I want you to take Naproxen 500mg  two times daily every day for the next 6 weeks. We will be getting a CT scan of your abdomen and pelvis to look at your hernia and at your ovaries. Come back to see [place] in about 6 weeks so we can see how you are doing.

## 2011-07-27 ENCOUNTER — Telehealth: Payer: Self-pay | Admitting: *Deleted

## 2011-07-27 NOTE — Telephone Encounter (Signed)
Pt left message stating that she needs an order for a CT scan @ [facility] radiology to re-evaluate her cyst.

## 2011-07-28 NOTE — Telephone Encounter (Signed)
Called pt- informed of CT scan appt on 08/03/11 @ 1030 @ [facility]. She will need to pick up her contrast by Friday of this week. She will receive instructions for when to drink the contrast when she picks it up from radiology @ [facility]. Pt voiced understanding.

## 2011-08-03 ENCOUNTER — Other Ambulatory Visit (HOSPITAL_COMMUNITY): Payer: Medicaid Other

## 2011-08-03 LAB — BASIC METABOLIC PANEL
Calcium: 8.9
Glucose, Bld: 94
Sodium: 135

## 2011-08-03 LAB — URINALYSIS, ROUTINE W REFLEX MICROSCOPIC
Bilirubin Urine: NEGATIVE
Nitrite: NEGATIVE
Specific Gravity, Urine: <1.005 — ABNORMAL LOW
Urobilinogen, UA: 0.2
pH: 5.5

## 2011-08-03 LAB — PREGNANCY, URINE: Preg Test, Ur: NEGATIVE

## 2011-08-03 LAB — CBC
Hemoglobin: 13.5
Platelets: 233
RDW: 12.7
WBC: 10.6

## 2011-08-03 LAB — DIFFERENTIAL
Basophils Absolute: 0.1
Lymphocytes Relative: 19 — ABNORMAL LOW
Lymphs Abs: 2
Monocytes Absolute: 1
Neutro Abs: 7.4

## 2011-08-06 ENCOUNTER — Inpatient Hospital Stay (HOSPITAL_COMMUNITY): Admission: RE | Admit: 2011-08-06 | Payer: Medicaid Other | Source: Ambulatory Visit

## 2011-08-10 DIAGNOSIS — D279 Benign neoplasm of unspecified ovary: Secondary | ICD-10-CM

## 2011-08-10 HISTORY — DX: Benign neoplasm of unspecified ovary: D27.9

## 2011-08-13 ENCOUNTER — Inpatient Hospital Stay (INDEPENDENT_AMBULATORY_CARE_PROVIDER_SITE_OTHER)
Admission: RE | Admit: 2011-08-13 | Discharge: 2011-08-13 | Disposition: A | Discharge status: Home / Self Care | Payer: Medicaid Other | Source: Ambulatory Visit | Attending: Emergency Medicine | Admitting: Emergency Medicine

## 2011-08-13 ENCOUNTER — Other Ambulatory Visit: Payer: Self-pay | Admitting: Obstetrics & Gynecology

## 2011-08-13 DIAGNOSIS — R05 Cough: Secondary | ICD-10-CM

## 2011-08-13 DIAGNOSIS — R197 Diarrhea, unspecified: Secondary | ICD-10-CM

## 2011-08-13 DIAGNOSIS — R102 Pelvic and perineal pain: Secondary | ICD-10-CM

## 2011-08-14 LAB — URINALYSIS, ROUTINE W REFLEX MICROSCOPIC
Nitrite: NEGATIVE
Specific Gravity, Urine: 1.015
Urobilinogen, UA: 0.2

## 2011-08-14 LAB — URINE MICROSCOPIC-ADD ON

## 2011-08-14 LAB — PREGNANCY, URINE: Preg Test, Ur: NEGATIVE

## 2011-08-24 ENCOUNTER — Ambulatory Visit (HOSPITAL_COMMUNITY)
Admission: RE | Admit: 2011-08-24 | Discharge: 2011-08-24 | Disposition: A | Discharge status: Home / Self Care | Payer: Medicaid Other | Source: Ambulatory Visit | Attending: Obstetrics & Gynecology | Admitting: Obstetrics & Gynecology

## 2011-08-24 DIAGNOSIS — K429 Umbilical hernia without obstruction or gangrene: Secondary | ICD-10-CM | POA: Insufficient documentation

## 2011-08-24 DIAGNOSIS — R197 Diarrhea, unspecified: Secondary | ICD-10-CM | POA: Insufficient documentation

## 2011-08-24 DIAGNOSIS — R112 Nausea with vomiting, unspecified: Secondary | ICD-10-CM | POA: Insufficient documentation

## 2011-08-24 DIAGNOSIS — R102 Pelvic and perineal pain: Secondary | ICD-10-CM

## 2011-08-24 DIAGNOSIS — D279 Benign neoplasm of unspecified ovary: Secondary | ICD-10-CM | POA: Insufficient documentation

## 2011-08-24 MED ORDER — IOHEXOL 300 MG/ML  SOLN: 100.0000 mL | Freq: Once | INTRAMUSCULAR | Status: AC | PRN

## 2011-09-18 ENCOUNTER — Ambulatory Visit: Payer: Medicaid Other | Admitting: Obstetrics & Gynecology

## 2011-10-15 ENCOUNTER — Emergency Department (INDEPENDENT_AMBULATORY_CARE_PROVIDER_SITE_OTHER)
Admission: EM | Admit: 2011-10-15 | Discharge: 2011-10-15 | Disposition: A | Discharge status: Home / Self Care | Payer: Medicaid Other | Source: Home / Self Care | Attending: Family Medicine | Admitting: Family Medicine

## 2011-10-15 ENCOUNTER — Encounter (HOSPITAL_COMMUNITY): Payer: Self-pay | Admitting: *Deleted

## 2011-10-15 DIAGNOSIS — N949 Unspecified condition associated with female genital organs and menstrual cycle: Secondary | ICD-10-CM

## 2011-10-15 DIAGNOSIS — N83209 Unspecified ovarian cyst, unspecified side: Secondary | ICD-10-CM

## 2011-10-15 DIAGNOSIS — R102 Pelvic and perineal pain: Secondary | ICD-10-CM

## 2011-10-15 DIAGNOSIS — R05 Cough: Secondary | ICD-10-CM

## 2011-10-15 MED ORDER — HYDROCODONE-ACETAMINOPHEN 5-500 MG PO TABS: 1.0000 | ORAL_TABLET | Freq: Four times a day (QID) | ORAL | Status: AC | PRN

## 2011-10-15 MED ORDER — PREDNISONE 20 MG PO TABS: ORAL_TABLET | ORAL | Status: AC

## 2011-10-15 MED ORDER — FEXOFENADINE-PSEUDOEPHED ER 60-120 MG PO TB12: 1.0000 | ORAL_TABLET | Freq: Two times a day (BID) | ORAL | Status: DC

## 2011-10-15 MED ORDER — NAPROXEN 500 MG PO TABS: 500.0000 mg | ORAL_TABLET | Freq: Two times a day (BID) | ORAL | Status: DC

## 2011-10-15 NOTE — ED Provider Notes (Signed)
History     CSN: [id number] Arrival date & time: 10/15/2011  6:30 PM   First MD Initiated Contact with Patient 10/15/11 1723      Chief Complaint  Patient presents with  . Sore Throat    (Consider location/radiation/quality/duration/timing/severity/associated sxs/prior treatment) HPI Comments: H/o chronic pelvic pain had ovarian cyst comes c/o : 1) coughing for over two weeks had cold symptoms initially but now resolved just non productive cough persistent, causing throat irritation.  No chest pain, no fever, good appetite, no pain with swallowing.  2) Ran out of her meds for chronic pelvic pain, [facility], has rt. dermoid cyst in recent CT scan. Reports constant moderate bilateral pelvic pain for several days finished her period 3 days ago. On birth control pills. Denies vaginal discharge, nausea, vomiting or diarrhea last BM today in AM. No dysuria, or hematuria.   The history is provided by the patient.    Past Medical History  Diagnosis Date  . Anxiety   . Chlamydia   . Gonorrhea   . Depression   . Bipolar affective disorder, depressed, mild     Past Surgical History  Procedure Date  . No past surgeries     Family History  Problem Relation Age of Onset  . Kidney disease Mother   . Diabetes Maternal Grandmother   . Arthritis Maternal Grandmother   . Arthritis Maternal Grandfather   . Diabetes Paternal Grandmother   . Hypertension Paternal Grandmother   . Arthritis Paternal Grandmother   . Kidney disease Paternal Grandmother   . Arthritis Paternal Grandfather     History  Substance Use Topics  . Smoking status: Current Everyday Smoker -- 7.0 packs/day  . Smokeless tobacco: Not on file  . Alcohol Use: Yes     occasionally    OB History    Grav Para Term Preterm Abortions TAB SAB Ect Mult Living   0    0     0      Review of Systems  Constitutional: Negative for fever, chills and appetite change.  HENT: Positive for  sore throat. Negative for congestion, rhinorrhea, trouble swallowing, neck pain, neck stiffness, voice change and sinus pressure.   Respiratory: Positive for cough. Negative for chest tightness, shortness of breath and wheezing.   Gastrointestinal: Negative for nausea, vomiting and diarrhea.  Genitourinary: Positive for pelvic pain. Negative for dysuria, frequency, hematuria, flank pain, vaginal bleeding, vaginal discharge and menstrual problem.  Musculoskeletal: Negative for myalgias and arthralgias.  Skin: Negative for rash.  Neurological: Negative for headaches.    Allergies  Review of patient's allergies indicates no known allergies.  Home Medications   Current Outpatient Rx  Name Route Sig Dispense Refill  . ACETAMINOPHEN 500 MG PO TABS Oral Take 500 mg by mouth once as needed. For breakthrough pain  in between taking Vicodin 7.5/500    . NORETHINDRONE-ETH ESTRADIOL 0.5-35 MG-MCG PO TABS Oral Take 1 tablet by mouth daily.      Marland Kitchen DICLOFENAC SODIUM 75 MG PO TBEC Oral Take 1 tablet (75 mg total) by mouth 2 (two) times daily. 60 tablet 1  . FEXOFENADINE-PSEUDOEPHED ER 60-120 MG PO TB12 Oral Take 1 tablet by mouth every 12 (twelve) hours. 30 tablet 0  . HYDROCODONE-ACETAMINOPHEN 5-500 MG PO TABS Oral Take 1-2 tablets by mouth every 6 (six) hours as needed for pain. 15 tablet 0  . NAPROXEN 500 MG PO TABS Oral Take 1 tablet (500 mg total) by mouth  2 (two) times daily with a meal. 60 tablet 2  . NORGESTIMATE-ETH ESTRADIOL 0.25-35 MG-MCG PO TABS Oral Take 1 tablet by mouth daily. 1 Package 11  . PREDNISONE 20 MG PO TABS  2 tabs po daily for 10 days 10 tablet 0    BP 111/74  Pulse 88  Temp(Src) 98.1 F (36.7 C) (Oral)  Resp 16  SpO2 100%  LMP 10/13/2011  Physical Exam  Nursing note and vitals reviewed. Constitutional: She is oriented to person, place, and time. She appears well-developed and well-nourished. No distress.  HENT:  Head: Normocephalic and atraumatic.  Nose: Nose normal.   Mouth/Throat: Oropharynx is clear and moist. No oropharyngeal exudate.  Eyes: Conjunctivae and EOM are normal. Pupils are equal, round, and reactive to light. No scleral icterus.  Neck: Normal range of motion. Neck supple.  Cardiovascular: Normal rate, regular rhythm and normal heart sounds.   Pulmonary/Chest: Effort normal and breath sounds normal. No respiratory distress. She has no wheezes. She has no rales. She exhibits no tenderness.  Abdominal: Soft. Bowel sounds are normal. She exhibits no distension and no mass. There is no rebound and no guarding.       Diffused bilateral lower quadrant pain reported with deep palpation.   Lymphadenopathy:    She has no cervical adenopathy.  Neurological: She is alert and oriented to person, place, and time.  Skin: No rash noted.    ED Course  Procedures (including critical care time)  Labs Reviewed - No data to display No results found.   1. Cough, persistent   2. Chronic female pelvic pain   3. Hemorrhagic cyst of ovary       MDM  Pt. Will reschedule appt. With gyn for follow up.        Sharin Grave, MD 10/16/11 1146

## 2011-10-15 NOTE — ED Notes (Signed)
C/o sore throat with congestion. States it hurts to swallow sometimes. Pt coughing productively and spitting in trashcan.  Pt. Also c/o pain from ovarian cysts - states pain is sharp stabbing pain on both sides.  Pt. States she is taking her birth control pills but pain is getting worse.  Pt. States she has not noticed a change in her periods.

## 2011-10-28 ENCOUNTER — Inpatient Hospital Stay (HOSPITAL_COMMUNITY)
Admission: AD | Admit: 2011-10-28 | Discharge: 2011-10-29 | Disposition: A | Discharge status: Home / Self Care | Payer: Medicaid Other | Source: Ambulatory Visit | Attending: Obstetrics and Gynecology | Admitting: Obstetrics and Gynecology

## 2011-10-28 ENCOUNTER — Encounter (HOSPITAL_COMMUNITY): Payer: Self-pay | Admitting: *Deleted

## 2011-10-28 ENCOUNTER — Inpatient Hospital Stay (HOSPITAL_COMMUNITY): Payer: Medicaid Other

## 2011-10-28 DIAGNOSIS — N949 Unspecified condition associated with female genital organs and menstrual cycle: Secondary | ICD-10-CM | POA: Insufficient documentation

## 2011-10-28 DIAGNOSIS — N946 Dysmenorrhea, unspecified: Secondary | ICD-10-CM | POA: Insufficient documentation

## 2011-10-28 DIAGNOSIS — N938 Other specified abnormal uterine and vaginal bleeding: Secondary | ICD-10-CM | POA: Insufficient documentation

## 2011-10-28 DIAGNOSIS — R102 Pelvic and perineal pain: Secondary | ICD-10-CM

## 2011-10-28 DIAGNOSIS — Z8742 Personal history of other diseases of the female genital tract: Secondary | ICD-10-CM

## 2011-10-28 DIAGNOSIS — D279 Benign neoplasm of unspecified ovary: Secondary | ICD-10-CM

## 2011-10-28 HISTORY — DX: Benign neoplasm of unspecified ovary: D27.9

## 2011-10-28 MED ORDER — HYDROMORPHONE HCL 2 MG PO TABS
2.0000 mg | ORAL_TABLET | Freq: Once | ORAL | Status: AC
  Administered 2011-10-28: 2 mg via ORAL
  Filled 2011-10-28: qty 1

## 2011-10-28 MED ORDER — DIPHENHYDRAMINE HCL 25 MG PO CAPS
25.0000 mg | ORAL_CAPSULE | Freq: Once | ORAL | Status: AC
  Administered 2011-10-28: 25 mg via ORAL
  Filled 2011-10-28: qty 1

## 2011-10-28 MED ORDER — HYDROMORPHONE HCL PF 1 MG/ML IJ SOLN
1.0000 mg | Freq: Once | INTRAMUSCULAR | Status: DC
  Filled 2011-10-28: qty 1

## 2011-10-28 NOTE — ED Provider Notes (Signed)
History    this 20 year old female gravida 1 para 0010 on oral contraceptives week 3 presents tonight after being sent home from work to 2 pelvic discomfort of 24 hours duration which began about the time of her developing breakthrough bleeding with dark blood noted last night she has chronic lower abdominal pain which is been treated on several occasions over the past year, most recently early December at the urgent care Center. She frequently requires Vicodin for her pelvic discomfort as well as ibuprofen. She has had several evaluations since June of this year when ultrasound showed a suspected right ovarian dermoid and a functional cyst on the left ovary. Functional cyst subsequently resolved on followup evaluation. She is seen Dr. Debroah Loop at the GYN clinic and has a followup appointment in January. She is tired of the pain and wants assistance with resolution of pain. She's currently sexually active single relationship, long-standing with negative GC and Chlamydia cultures earlier this fall while in the same relationship. She desires future childbearing   Chief Complaint  Patient presents with  . Abdominal Pain    chronic low grade x 2weeks, became significantly worse with onset yesterday of breakthru bleeding on OCP's   HPI as above, with chronic lower abdominal pain x2 weeks acutely worse with onset of menses. She denies fever or chills or abnormal vaginal discharge.  Pertinent Gynecological History: Menses: flow is light and Described as dark old blood Bleeding: dysfunctional uterine bleeding while on week 3 of current OCPs Contraception: OCP (estrogen/progesterone) DES exposure: unknown Blood transfusions: none Sexually transmitted diseases: past history: Distant history GC and Chlamydia, with recent negative tests this fall Previous GYN Procedures: None  Last mammogram:  Date:  Last pap:  Date:    Past Medical History  Diagnosis Date  . Anxiety   . Chlamydia   . Gonorrhea   .  Depression   . Bipolar affective disorder, depressed, mild   . 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 No past surgeries     Family History  Problem Relation Age of Onset  . Kidney disease Mother   . Diabetes Maternal Grandmother   . Arthritis Maternal Grandmother   . Arthritis Maternal Grandfather   . Diabetes Paternal Grandmother   . Hypertension Paternal Grandmother   . Arthritis Paternal Grandmother   . Kidney disease Paternal Grandmother   . Arthritis Paternal Grandfather     History  Substance Use Topics  . Smoking status: Current Everyday Smoker -- 0.5 packs/day  . Smokeless tobacco: Not on file  . Alcohol Use: 0.5 oz/week    1 drink(s) per week     occasionally    Allergies: No Known Allergies  Prescriptions prior to admission  Medication Sig Dispense Refill  . norethindrone-ethinyl estradiol (NECON,BREVICON,MODICON) 0.5-35 MG-MCG tablet Take 1 tablet by mouth daily.          ROS Physical Exam  Physical Examination: General appearance - alert, well appearing, and in no distress, overweight, in mild to moderate distress and crying Abdomen - soft, nontender, nondistended, no masses or organomegaly tenderness noted in lower abdomen with no focal point bowel sounds normal no bladder distension noted no CVA tenderness no inguinal adenopathy Pelvic - VULVA: normal appearing vulva with no masses, tenderness or lesions, VAGINA: normal appearing vagina with normal color and discharge, no lesions, dark menstrual blood present with no purulence, CERVIX: normal appearing cervix without discharge or  lesions, cervical motion tenderness absent, light pelvic discomfort was all aspects of exam, UTERUS: uterus is normal size, shape, consistency and nontender, anteverted, ADNEXA: Mild discomfort bilaterally equal to bimanual exam of these uterus, not considered to represent PID  Blood pressure 120/58, pulse 85,  temperature 98.1 F (36.7 C), resp. rate 20, height 5\' 4"  (1.626 m), weight 85.73 kg (189 lb), last menstrual period 10/13/2011, SpO2 98.00%.  Physical Exam  MAU Course  Procedures review of a prior ultrasound raises the question of an endometrial polyp as well will proceed with TO rule out an endometrial polyp Transvaginal ultrasound is ordered. Results and transvaginal ultrasound showed a normal endometrium 7 mm in thickness without evidence of endometrial polyp. The right adnexal dermoid cyst remains stable at 2.5 cm within the right ovary there was no suspicion of torsion. Patient tolerates exam well  MDM Review of prior ultrasound and prior CT scan, pelvic exam and review of current ultrasound  Assessment and Plan  bleeding on oral contraceptives, breakthrough Dysmenorrhea Plan: Will give prescription for Vicodin x30 tablets refills 0 and Motrin 600 mg tablets x30 tabs, refill x1           Followup as scheduled GYN clinic for consideration of laparoscopic removal of dermoid cyst           Stop OCPs x5 days to allow completion of menses then resume new OCP pills pack.  Shooter Tangen V 10/28/2011, 11:42 PM

## 2011-10-28 NOTE — Progress Notes (Signed)
Pt sttes that has a history of ovarian cysts-sttes she has had constant pain for last week-has gotten much worse in the last 2 days

## 2011-10-28 NOTE — Progress Notes (Signed)
Pt states she has been having really pain since last night. Pt states she did not go to sleep. Pt states she went to work but was sent home.

## 2011-10-29 MED ORDER — IBUPROFEN 200 MG PO TABS: 600.0000 mg | ORAL_TABLET | Freq: Four times a day (QID) | ORAL | Status: AC | PRN

## 2011-10-29 MED ORDER — HYDROCODONE-ACETAMINOPHEN 5-325 MG PO TABS: 1.0000 | ORAL_TABLET | Freq: Four times a day (QID) | ORAL | Status: AC | PRN

## 2011-11-05 ENCOUNTER — Telehealth (HOSPITAL_COMMUNITY): Payer: Self-pay | Admitting: *Deleted

## 2011-12-02 ENCOUNTER — Ambulatory Visit: Payer: PRIVATE HEALTH INSURANCE | Admitting: Obstetrics & Gynecology

## 2011-12-25 ENCOUNTER — Ambulatory Visit: Payer: Medicaid Other | Admitting: Family

## 2012-01-27 ENCOUNTER — Ambulatory Visit: Payer: Medicaid Other | Admitting: Obstetrics and Gynecology

## 2012-04-28 ENCOUNTER — Telehealth: Payer: Self-pay | Admitting: General Practice

## 2012-04-28 NOTE — Telephone Encounter (Signed)
Patient called stating she has an appt on July 1st and that the pain from her cyst is getting unbearable. Patient states she wants to know if theres anything she can do to help with the pain or could she possibly be seen sooner and to give her a call back.

## 2012-05-05 ENCOUNTER — Ambulatory Visit: Payer: Self-pay | Admitting: Obstetrics & Gynecology

## 2012-05-05 NOTE — Telephone Encounter (Signed)
Called patient and offered 2:00 appointment today- patient states can't come then, can't miss any more work and her pain isn't that bad. States will keep Monday appt. Reverified time of appt with her. Also instructed her may take ibuprofen 600mg  every 6 hours with lots of water- states already taking that.

## 2012-05-09 ENCOUNTER — Ambulatory Visit: Payer: Self-pay | Admitting: Obstetrics & Gynecology

## 2012-05-25 ENCOUNTER — Inpatient Hospital Stay (HOSPITAL_COMMUNITY): Payer: Self-pay

## 2012-05-25 ENCOUNTER — Inpatient Hospital Stay (HOSPITAL_COMMUNITY)
Admission: AD | Admit: 2012-05-25 | Discharge: 2012-05-25 | Disposition: A | Discharge status: Home / Self Care | Payer: Self-pay | Source: Ambulatory Visit | Attending: Obstetrics & Gynecology | Admitting: Obstetrics & Gynecology

## 2012-05-25 DIAGNOSIS — D72829 Elevated white blood cell count, unspecified: Secondary | ICD-10-CM | POA: Insufficient documentation

## 2012-05-25 DIAGNOSIS — R109 Unspecified abdominal pain: Secondary | ICD-10-CM | POA: Insufficient documentation

## 2012-05-25 DIAGNOSIS — D279 Benign neoplasm of unspecified ovary: Secondary | ICD-10-CM | POA: Insufficient documentation

## 2012-05-25 LAB — CBC
MCV: 86 fL (ref 78.0–100.0)
Platelets: 207 10*3/uL (ref 150–400)
RBC: 5.06 MIL/uL (ref 3.87–5.11)
RDW: 14 % (ref 11.5–15.5)
WBC: 13.8 10*3/uL — ABNORMAL HIGH (ref 4.0–10.5)

## 2012-05-25 LAB — URINALYSIS, ROUTINE W REFLEX MICROSCOPIC
Glucose, UA: NEGATIVE mg/dL
Hgb urine dipstick: NEGATIVE
Ketones, ur: NEGATIVE mg/dL
Protein, ur: NEGATIVE mg/dL
Urobilinogen, UA: 0.2 mg/dL (ref 0.0–1.0)

## 2012-05-25 LAB — WET PREP, GENITAL
Trich, Wet Prep: NONE SEEN
Yeast Wet Prep HPF POC: NONE SEEN

## 2012-05-25 LAB — POCT PREGNANCY, URINE: Preg Test, Ur: NEGATIVE

## 2012-05-25 MED ORDER — HYDROCODONE-ACETAMINOPHEN 5-500 MG PO TABS: 1.0000 | ORAL_TABLET | Freq: Four times a day (QID) | ORAL | Status: AC | PRN

## 2012-05-25 NOTE — MAU Note (Signed)
Patient states she has a know dermoid cyst and missed her appointment with Dr. Debroah Loop on 7-1. Has increasing abdominal pain. Started having symptoms of a head cold about 3 days ago. Sore throat and mild coughing, no fever. No bleeding or vaginal discharge.

## 2012-05-25 NOTE — MAU Provider Note (Signed)
History     CSN: [id number]  Arrival date and time: 05/25/12 1711   None     Chief Complaint  Patient presents with  . Abdominal Pain  . URI   HPI This is a 21 y.o. female who presents with c/o severe lower abdominal pain for several days. States has a known dermoid cyst diagnosed in 2012. Was scheduled for 5 visits in clinic since then and missed all appointments Has not been seen since December.  States has some nausea too. Has been able to have sex "sometimes it hurts".  Unprotected and no contraception.   Also c/o URI symptoms.  RN NOTE:    Patient states she has a know dermoid cyst and missed her appointment with Dr. Debroah Loop on 7-1. Has increasing abdominal pain. Started having symptoms of a head cold about 3 days ago. Sore throat and mild coughing, no fever. No bleeding or vaginal discharge.       OB History    Grav Para Term Preterm Abortions TAB SAB Ect Mult Living   1    1  1    0      Past Medical History  Diagnosis Date  . Anxiety   . Chlamydia   . Gonorrhea   . Depression   . Bipolar affective disorder, depressed, mild   . 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 No past surgeries     Family History  Problem Relation Age of Onset  . Kidney disease Mother   . Diabetes Maternal Grandmother   . Arthritis Maternal Grandmother   . Arthritis Maternal Grandfather   . Diabetes Paternal Grandmother   . Hypertension Paternal Grandmother   . Arthritis Paternal Grandmother   . Kidney disease Paternal Grandmother   . Arthritis Paternal Grandfather     History  Substance Use Topics  . Smoking status: Current Everyday Smoker -- 0.5 packs/day  . Smokeless tobacco: Not on file  . Alcohol Use: 0.5 oz/week    1 drink(s) per week     occasionally    Allergies: No Known Allergies  Prescriptions prior to admission  Medication Sig Dispense Refill  . norethindrone-ethinyl estradiol  (NECON,BREVICON,MODICON) 0.5-35 MG-MCG tablet Take 1 tablet by mouth daily.          ROS As above.  Physical Exam   Blood pressure 118/77, pulse 103, temperature 99.1 F (37.3 C), temperature source Oral, resp. rate 16, height 5' 3.5" (1.613 m), weight 180 lb 12.8 oz (82.01 kg), last menstrual period 05/20/2012, SpO2 99.00%.  Physical Exam  Constitutional: She is oriented to person, place, and time. She appears well-developed and well-nourished. No distress.  HENT:  Head: Normocephalic.  Cardiovascular: Normal rate.   Respiratory: Effort normal.  GI: Soft. She exhibits no distension and no mass. There is tenderness (slight over RLQ). There is no rebound and no guarding.  Genitourinary: Vagina normal and uterus normal. No vaginal discharge found.  Musculoskeletal: Normal range of motion.  Neurological: She is alert and oriented to person, place, and time.  Skin: Skin is warm and dry.  Psychiatric: She has a normal mood and affect.  No CMT No significant adnexal tenderness  MAU Course  Procedures  MDM Will check CBC to help r/o appy. >>  Results for orders placed during the hospital encounter of 05/25/12 (from the past 24 hour(s))  URINALYSIS, ROUTINE W REFLEX MICROSCOPIC     Status: Normal  Collection Time   05/25/12  5:40 PM      Component Value Range   Color, Urine YELLOW  YELLOW   APPearance CLEAR  CLEAR   Specific Gravity, Urine 1.015  1.005 - 1.030   pH 6.0  5.0 - 8.0   Glucose, UA NEGATIVE  NEGATIVE mg/dL   Hgb urine dipstick NEGATIVE  NEGATIVE   Bilirubin Urine NEGATIVE  NEGATIVE   Ketones, ur NEGATIVE  NEGATIVE mg/dL   Protein, ur NEGATIVE  NEGATIVE mg/dL   Urobilinogen, UA 0.2  0.0 - 1.0 mg/dL   Nitrite NEGATIVE  NEGATIVE   Leukocytes, UA NEGATIVE  NEGATIVE  POCT PREGNANCY, URINE     Status: Normal   Collection Time   05/25/12  5:49 PM      Component Value Range   Preg Test, Ur NEGATIVE  NEGATIVE  WET PREP, GENITAL     Status: Abnormal   Collection Time    05/25/12  7:17 PM      Component Value Range   Yeast Wet Prep HPF POC NONE SEEN  NONE SEEN   Trich, Wet Prep NONE SEEN  NONE SEEN   Clue Cells Wet Prep HPF POC MODERATE (*) NONE SEEN   WBC, Wet Prep HPF POC FEW (*) NONE SEEN  CBC     Status: Abnormal   Collection Time   05/25/12  7:29 PM      Component Value Range   WBC 13.8 (*) 4.0 - 10.5 K/uL   RBC 5.06  3.87 - 5.11 MIL/uL   Hemoglobin 14.5  12.0 - 15.0 g/dL   HCT 16.1  09.6 - 04.5 %   MCV 86.0  78.0 - 100.0 fL   MCH 28.7  26.0 - 34.0 pg   MCHC 33.3  30.0 - 36.0 g/dL   RDW 40.9  81.1 - 91.4 %   Platelets 207  150 - 400 K/uL  US Transvaginal Non-ob  05/25/2012  *RADIOLOGY REPORT*  Clinical Data: Follow-up right ovarian dermoid  TRANSABDOMINAL AND TRANSVAGINAL ULTRASOUND OF PELVIS  Technique:  Both transabdominal and transvaginal ultrasound examinations of the pelvis were performed.  Transabdominal technique was performed for global imaging of the pelvis including uterus, ovaries, adnexal regions, and pelvic cul-de-sac.  It was necessary to proceed with endovaginal exam following the transabdominal exam to visualize the ovaries, not seen well transabdominally.  Comparison:  CT 01/17/2007, CT 08/24/2011, pelvic ultrasound 10/29/2011  Findings: Uterus:  Anteverted, anteflexed.  6.8 x 4.8 x 3.4 cm.  No focal abnormality.  Endometrium: 1 cm.  Uniformly thin and echogenic.  Right ovary: 5.0 x 3.7 x 2.7 cm.  Echogenic shadowing mass with mixed internal echo texture measuring 3.0 x 2.5 x 2.5 cm noted, compatible with previously seen dermoid, previously measuring 2.8 x 2.7 x 2.5 cm.  Left ovary: 5.0 x 2.8 x 2.5 cm.  Normal.  Other Findings:  No free fluid  IMPRESSION: Allowing for differences in technique, no significant change in size or appearance of right ovarian dermoid.  No new abnormality.  Original Report Authenticated By: Harrel Lemon, M.D.    Discussed with Dr Despina Hidden >> probably not appendicitis.   WIll refer back to clinic to discuss  treatment of dermoid.  Assessment and Plan  A:  Abdominal pain      Right Dermoid cyst, stable in size since Dec. 2012      Mild leukocytosis       P:  Discharge       Followup with clinic  If symptoms worsen, go to ED for further assessment       Rx vicodin #30  [facility] 05/25/2012, 6:21 PM

## 2012-05-27 ENCOUNTER — Emergency Department (HOSPITAL_COMMUNITY)
Admission: EM | Admit: 2012-05-27 | Discharge: 2012-05-27 | Disposition: A | Discharge status: Home / Self Care | Payer: Self-pay | Attending: Emergency Medicine | Admitting: Emergency Medicine

## 2012-05-27 ENCOUNTER — Encounter (HOSPITAL_COMMUNITY): Payer: Self-pay | Admitting: *Deleted

## 2012-05-27 ENCOUNTER — Emergency Department (HOSPITAL_COMMUNITY): Payer: Self-pay

## 2012-05-27 DIAGNOSIS — F319 Bipolar disorder, unspecified: Secondary | ICD-10-CM | POA: Insufficient documentation

## 2012-05-27 DIAGNOSIS — J40 Bronchitis, not specified as acute or chronic: Secondary | ICD-10-CM | POA: Insufficient documentation

## 2012-05-27 DIAGNOSIS — J069 Acute upper respiratory infection, unspecified: Secondary | ICD-10-CM | POA: Insufficient documentation

## 2012-05-27 DIAGNOSIS — F411 Generalized anxiety disorder: Secondary | ICD-10-CM | POA: Insufficient documentation

## 2012-05-27 DIAGNOSIS — F17200 Nicotine dependence, unspecified, uncomplicated: Secondary | ICD-10-CM | POA: Insufficient documentation

## 2012-05-27 MED ORDER — IBUPROFEN 400 MG PO TABS
800.0000 mg | ORAL_TABLET | Freq: Once | ORAL | Status: AC
  Administered 2012-05-27: 800 mg via ORAL
  Filled 2012-05-27: qty 2

## 2012-05-27 MED ORDER — BENZONATATE 100 MG PO CAPS: 100.0000 mg | ORAL_CAPSULE | Freq: Three times a day (TID) | ORAL | Status: AC

## 2012-05-27 MED ORDER — OXYMETAZOLINE HCL 0.05 % NA SOLN: NASAL | Status: DC

## 2012-05-27 MED ORDER — ALBUTEROL SULFATE HFA 108 (90 BASE) MCG/ACT IN AERS
2.0000 | INHALATION_SPRAY | Freq: Once | RESPIRATORY_TRACT | Status: AC
  Administered 2012-05-27: 2 via RESPIRATORY_TRACT
  Filled 2012-05-27: qty 6.7

## 2012-05-27 NOTE — ED Provider Notes (Signed)
Medical screening examination/treatment/procedure(s) were performed by non-physician practitioner and as supervising physician I was immediately available for consultation/collaboration.   Jaymond Waage, MD 05/27/12 2347 

## 2012-05-27 NOTE — ED Notes (Signed)
Cold with nasal and chest congestion since Monday with a headache and sorethroat from coughing.  Her roommate has bronchitis

## 2012-05-27 NOTE — ED Provider Notes (Signed)
History     CSN: [id number]  Arrival date & time 05/27/12  Amber Ray   First MD Initiated Contact with Patient 05/27/12 2055      Chief Complaint  Patient presents with  . Chills    (Consider location/radiation/quality/duration/timing/severity/associated sxs/prior treatment) Patient is a 21 y.o. female presenting with URI. The history is provided by the patient.  URI The primary symptoms include fatigue, headaches, sore throat, cough and myalgias. Primary symptoms do not include fever, ear pain, wheezing, abdominal pain, nausea, vomiting or rash.  The headache is not associated with eye pain, neck stiffness or weakness.  The myalgias are not associated with weakness.  Symptoms associated with the illness include chills, sinus pressure and congestion.  Pt states symptoms started out with nasal congestion, watery eyes, facial pressure 5 days ago, turned into dry cough, states usually unable to stop coughing if starts. Roommate has bronchitis. States today at work, felt like she could not breath, states "chest felt tight." states taking dayquil with no relief. Denies fever, no nausea, vomiting, chest pain..   Past Medical History  Diagnosis Date  . Anxiety   . Chlamydia   . Gonorrhea   . Depression   . Bipolar affective disorder, depressed, mild   . 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 No past surgeries     Family History  Problem Relation Age of Onset  . Kidney disease Mother   . Diabetes Maternal Grandmother   . Arthritis Maternal Grandmother   . Arthritis Maternal Grandfather   . Diabetes Paternal Grandmother   . Hypertension Paternal Grandmother   . Arthritis Paternal Grandmother   . Kidney disease Paternal Grandmother   . Arthritis Paternal Grandfather     History  Substance Use Topics  . Smoking status: Current Everyday Smoker -- 0.5 packs/day  . Smokeless tobacco: Not on file  . Alcohol Use: 0.5  oz/week    1 drink(s) per week     occasionally    OB History    Grav Para Term Preterm Abortions TAB SAB Ect Mult Living   1    1  1    0      Review of Systems  Constitutional: Positive for chills and fatigue. Negative for fever.  HENT: Positive for congestion, sore throat, postnasal drip and sinus pressure. Negative for ear pain, neck pain and neck stiffness.   Eyes: Positive for itching. Negative for pain.  Respiratory: Positive for cough, chest tightness and shortness of breath. Negative for wheezing.   Cardiovascular: Negative for chest pain, palpitations and leg swelling.  Gastrointestinal: Negative for nausea, vomiting and abdominal pain.  Genitourinary: Negative for flank pain.  Musculoskeletal: Positive for myalgias.  Skin: Negative for rash.  Neurological: Positive for headaches. Negative for dizziness, weakness and light-headedness.    Allergies  Review of patient's allergies indicates no known allergies.  Home Medications   Current Outpatient Rx  Name Route Sig Dispense Refill  . HYDROCODONE-ACETAMINOPHEN 5-500 MG PO TABS Oral Take 1 tablet by mouth every 6 (six) hours as needed for pain. 30 tablet 0    BP 93/70  Pulse 107  Temp 98.4 F (36.9 C) (Oral)  Resp 16  SpO2 95%  LMP 05/20/2012  Physical Exam  Nursing note and vitals reviewed. Constitutional: She is oriented to person, place, and time. She appears well-developed and well-nourished. No distress.  HENT:  Head: Normocephalic and atraumatic.  Right Ear: External ear normal.  Left Ear: External ear normal.  Nose: Nose normal.  Mouth/Throat: Oropharynx is clear and moist. No oropharyngeal exudate.       TMs normal bilaterally, there is post nasal drainage, tonsils normal.   Eyes: Conjunctivae are normal.  Neck: Neck supple.  Cardiovascular: Normal rate, regular rhythm and normal heart sounds.   Pulmonary/Chest: Effort normal and breath sounds normal. No respiratory distress. She has no wheezes.  She has no rales. She exhibits no tenderness.  Musculoskeletal: She exhibits no edema.  Lymphadenopathy:    She has no cervical adenopathy.  Neurological: She is alert and oriented to person, place, and time.  Skin: Skin is warm and dry.  Psychiatric: She has a normal mood and affect.    ED Course  Procedures (including critical care time)  Labs Reviewed - No data to display Dg Chest 2 View  05/27/2012  *RADIOLOGY REPORT*  Clinical Data: Chest tightness.  Cough.  CHEST - 2 VIEW  Comparison: Chest x-ray 10/20/2007.  Findings: Lung volumes are normal.  No consolidative airspace disease.  No pleural effusions.  No pneumothorax.  No pulmonary nodule or mass noted.  Pulmonary vasculature and the cardiomediastinal silhouette are within normal limits.  IMPRESSION: 1. No radiographic evidence of acute cardiopulmonary disease.  Original Report Authenticated By: Florencia Reasons, M.D.   Pt with URI symptoms, nasal congestion, cough, sore throat, chest tightness today. CXR negative. i do not think pt has PE, low risk for ACS. Will treat her congestion, cough, inhaler given.  1. URI (upper respiratory infection)   2. Bronchitis       MDM         Lottie Mussel, PA 05/27/12 [phone]

## 2012-05-27 NOTE — ED Notes (Signed)
Pt ambulated with a steady gait;VSS; A&Ox3; no signs of distress; respirations even and unlabored; skin warm and dry; no questions at this time.  

## 2012-06-11 ENCOUNTER — Encounter (HOSPITAL_COMMUNITY): Payer: Self-pay | Admitting: Emergency Medicine

## 2012-06-11 ENCOUNTER — Emergency Department (HOSPITAL_COMMUNITY)
Admission: EM | Admit: 2012-06-11 | Discharge: 2012-06-11 | Disposition: A | Discharge status: Home / Self Care | Payer: Self-pay | Attending: Emergency Medicine | Admitting: Emergency Medicine

## 2012-06-11 DIAGNOSIS — K029 Dental caries, unspecified: Secondary | ICD-10-CM

## 2012-06-11 DIAGNOSIS — K089 Disorder of teeth and supporting structures, unspecified: Secondary | ICD-10-CM | POA: Insufficient documentation

## 2012-06-11 DIAGNOSIS — R062 Wheezing: Secondary | ICD-10-CM | POA: Insufficient documentation

## 2012-06-11 DIAGNOSIS — J9801 Acute bronchospasm: Secondary | ICD-10-CM

## 2012-06-11 DIAGNOSIS — K0889 Other specified disorders of teeth and supporting structures: Secondary | ICD-10-CM

## 2012-06-11 MED ORDER — HYDROCODONE-ACETAMINOPHEN 5-325 MG PO TABS: 2.0000 | ORAL_TABLET | Freq: Three times a day (TID) | ORAL | Status: AC | PRN

## 2012-06-11 MED ORDER — ALBUTEROL SULFATE HFA 108 (90 BASE) MCG/ACT IN AERS: 2.0000 | INHALATION_SPRAY | RESPIRATORY_TRACT | Status: DC | PRN

## 2012-06-11 MED ORDER — PENICILLIN V POTASSIUM 500 MG PO TABS: 1000.0000 mg | ORAL_TABLET | Freq: Two times a day (BID) | ORAL | Status: AC

## 2012-06-11 MED ORDER — PREDNISONE 20 MG PO TABS: ORAL_TABLET | ORAL | Status: AC

## 2012-06-11 NOTE — ED Provider Notes (Signed)
History   This chart was scribed for Hurman Horn, MD by Melba Coon. The patient was seen in room TR05C/TR05C and the patient's care was started at 3:00PM.    CSN: [id number]  Arrival date & time 06/11/12  1333   None     Chief Complaint  Patient presents with  . Sore Throat  . Cough    (Consider location/radiation/quality/duration/timing/severity/associated sxs/prior treatment) HPI Amber Ray is a 21 y.o. female who presents to the Emergency Department complaining of persistent, moderate sore throat and cough with an onset 2 weeks ago. Pt states she has been treated for bronchitis since onset that has not improved. Hemoptysis present yesterday, but not today. Pt also c/o right upper and lower toothache that radiates towards the right ear. Her inhaler alleviated the cough but she ran out about a week ago; cough pills did not alleviated the cough. Coughing is worse in the morning and worse at work where she works with a lot of particles int he air; pt works in a Environmental [employer]. No fever, neck pain, rash, back pain, CP, SOB, abd pain, n/v/d, dysuria, or extremity pain, edema, weakness, numbness, or tingling. No known allergies. No other pertinent medical symptoms.  Past Medical History  Diagnosis Date  . Anxiety   . Chlamydia   . Gonorrhea   . Depression   . Bipolar affective disorder, depressed, mild   . 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 No past surgeries     Family History  Problem Relation Age of Onset  . Kidney disease Mother   . Diabetes Maternal Grandmother   . Arthritis Maternal Grandmother   . Arthritis Maternal Grandfather   . Diabetes Paternal Grandmother   . Hypertension Paternal Grandmother   . Arthritis Paternal Grandmother   . Kidney disease Paternal Grandmother   . Arthritis Paternal Grandfather     History  Substance Use Topics  . Smoking status: Current Everyday Smoker -- 0.5  packs/day  . Smokeless tobacco: Not on file  . Alcohol Use: 0.5 oz/week    1 drink(s) per week     occasionally    OB History    Grav Para Term Preterm Abortions TAB SAB Ect Mult Living   1    1  1    0      Review of Systems 10 Systems reviewed and all are negative for acute change except as noted in the HPI.   Allergies  Review of patient's allergies indicates no known allergies.  Home Medications   Current Outpatient Rx  Name Route Sig Dispense Refill  . IBUPROFEN 200 MG PO TABS Oral Take 600 mg by mouth every 8 (eight) hours as needed. For pain    . ALBUTEROL SULFATE HFA 108 (90 BASE) MCG/ACT IN AERS Inhalation Inhale 2 puffs into the lungs every 2 (two) hours as needed for wheezing or shortness of breath (cough). 1 Inhaler 0  . HYDROCODONE-ACETAMINOPHEN 5-325 MG PO TABS Oral Take 2 tablets by mouth every 8 (eight) hours as needed for pain. 10 tablet 0  . PENICILLIN V POTASSIUM 500 MG PO TABS Oral Take 2 tablets (1,000 mg total) by mouth 2 (two) times daily. X 7 days 28 tablet 0  . PREDNISONE 20 MG PO TABS  3 tabs po day one, then 2 po daily x 4 days 11 tablet 0    BP 97/78  Pulse 82  Temp 98.4 F (36.9 C) (Oral)  Resp 20  SpO2 100%  LMP 05/29/2012  Physical Exam  Nursing note and vitals reviewed. Constitutional:       Awake, alert, nontoxic appearance.  HENT:  Head: Atraumatic.  Mouth/Throat: Oropharynx is clear and moist.       Tooth 30 - large cavity TTP with localized gingival tenderness; tooth 3 TTP and used to have partial filling; tooth 1 with cavity. No trismus, stridor or drooling.  Eyes: Right eye exhibits no discharge. Left eye exhibits no discharge.  Neck: Neck supple.  Pulmonary/Chest: Effort normal. She has wheezes (diffuse expiratory wheezing without retractions or accesory muscle use). She exhibits no tenderness.       Able to speak full sentences.  Abdominal: Soft. There is no tenderness. There is no rebound.  Musculoskeletal: She exhibits no  tenderness.       Baseline ROM, no obvious new focal weakness.  Neurological:       Mental status and motor strength appears baseline for patient and situation.  Skin: No rash noted.  Psychiatric: She has a normal mood and affect.    ED Course  Procedures (including critical care time)  DIAGNOSTIC STUDIES: Oxygen Saturation is 100% on roomair, normal by my interpretation.    COORDINATION OF CARE:  3:04PM - penicillin will be Rx for the pt; pt advised to f/u with dentist; pt ready for d/c.   Labs Reviewed - No data to display No results found.   1. Bronchospasm, acute   2. Pain, dental   3. Dental cavity       MDM  I personally performed the services described in this documentation, which was scribed in my presence. The recorded information has been reviewed and considered.  Patient / Family / Caregiver informed of clinical course, understand medical decision-making process, and agree with plan.        Hurman Horn, MD 06/18/12 [phone]

## 2012-06-11 NOTE — ED Notes (Signed)
Pt reports treated for bronchitis 2 weeks ago but not getting any better. Pt reports yesterday coughed up a little bit of blood, but none today. Pt also c/o right ear pain and right upper and lower toothache.

## 2012-06-15 ENCOUNTER — Encounter: Payer: Medicaid Other | Admitting: Obstetrics & Gynecology

## 2012-07-06 ENCOUNTER — Ambulatory Visit (INDEPENDENT_AMBULATORY_CARE_PROVIDER_SITE_OTHER): Payer: Medicaid Other | Admitting: Obstetrics and Gynecology

## 2012-07-06 VITALS — BP 111/75 | HR 88 | Temp 97.6°F | Ht 64.0 in | Wt 177.0 lb

## 2012-07-06 DIAGNOSIS — N926 Irregular menstruation, unspecified: Secondary | ICD-10-CM

## 2012-07-06 DIAGNOSIS — N949 Unspecified condition associated with female genital organs and menstrual cycle: Secondary | ICD-10-CM

## 2012-07-06 DIAGNOSIS — D369 Benign neoplasm, unspecified site: Secondary | ICD-10-CM | POA: Insufficient documentation

## 2012-07-06 LAB — POCT PREGNANCY, URINE: Preg Test, Ur: NEGATIVE

## 2012-07-06 NOTE — Progress Notes (Signed)
Patient ID: Amber Ray, female   DOB: 1991-04-26, 21 y.o.   MRN: [id number] 21 yo G1P0010 with LMP 05/29/2012 presenting today as an MAU follow-up from 7/17 for ovarian cyst. Patient was diagnosed with a stable 3 cm dermoid cyst on right ovary at that time. Since her MAU visit, patient reports that she experiences daily pain, not as bad as her MAU visit but enough that she is requesting surgical intervention. Patient is otherwise without complaints.    Past Medical History  Diagnosis Date  . Anxiety   . Chlamydia   . Gonorrhea   . Depression   . Bipolar affective disorder, depressed, mild   . 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 No past surgeries    Family History  Problem Relation Age of Onset  . Kidney disease Mother   . Diabetes Maternal Grandmother   . Arthritis Maternal Grandmother   . Arthritis Maternal Grandfather   . Diabetes Paternal Grandmother   . Hypertension Paternal Grandmother   . Arthritis Paternal Grandmother   . Kidney disease Paternal Grandmother   . Arthritis Paternal Grandfather    History  Substance Use Topics  . Smoking status: Current Everyday Smoker -- 0.5 packs/day  . Smokeless tobacco: Not on file  . Alcohol Use: 0.5 oz/week    1 drink(s) per week     occasionally   Abd: S/NT/ND Bimanual: small uterus, no palpable adnexal mass or tenderness  A/P 21 yo G1P0010 with 3 cm right ovarian dermoid cyst - Patient elected for laparoscopic cystectomy. Risks, benefits and alternatives were discussed including but not limited to risk of bleeding, infection and damage to adjacent organs. Patient verbalized understanding and all questions were answered. - patient will be contacted by surgical scheduler with date and time of surgery.

## 2012-07-06 NOTE — Addendum Note (Signed)
Addended by: Catalina Antigua on: 07/06/2012 04:27 PM   Modules accepted: Orders

## 2012-07-07 NOTE — ED Notes (Signed)
Pt called, did not get Rx x4 filled from 8/3 visit and is trying to get Rx for Norco filled today and RPh would not allow her to just get pain Rx (not abx, steroid or inhaler).  Pt informed we are acute facility and treat acute problems and that we would not honor the Rx for Norco as it was older than 2 weeks and we will not authorize the pharmacy to fill it.  Pt asked to speak to Supervisor was informed she could call back tomorrow to speak with them pt at this time made threat to come up here and kick current writers ass.  Current Clinical research associate ended call at this time.

## 2012-07-13 ENCOUNTER — Encounter (HOSPITAL_COMMUNITY): Payer: Self-pay | Admitting: Pharmacist

## 2012-07-20 NOTE — Patient Instructions (Addendum)
   Your procedure is scheduled on: Wednesday September 18th  Enter through the [employer] of [facility] at:8:15am Pick up the phone at the desk and dial [phone] and inform us of your arrival.  Please call this number if you have any problems the morning of surgery: [phone]  Remember: Do not eat or drink anything after midnight on Tuesday   Do not wear jewelry, make-up, or FINGER nail polish No metal in your hair or on your body. Do not wear lotions, powders, perfumes or deodorant. Do not shave 48 hours prior to surgery. Do not bring valuables to the hospital. Contacts, dentures or bridgework may not be worn into surgery.  Leave suitcase in the car. After Surgery it may be brought to your room. For patients being admitted to the hospital, checkout time is 11:00am the day of discharge.  Patients discharged on the day of surgery will not be allowed to drive home.     Remember to use your hibiclens as instructed.Please shower with 1/2 bottle the evening before your surgery and the other 1/2 bottle the morning of surgery. Neck down avoiding private area.

## 2012-07-21 ENCOUNTER — Encounter (HOSPITAL_COMMUNITY): Payer: Self-pay

## 2012-07-21 ENCOUNTER — Encounter: Payer: Self-pay | Admitting: Obstetrics and Gynecology

## 2012-07-21 ENCOUNTER — Encounter (HOSPITAL_COMMUNITY)
Admission: RE | Admit: 2012-07-21 | Discharge: 2012-07-21 | Disposition: A | Discharge status: Home / Self Care | Payer: Self-pay | Source: Ambulatory Visit | Attending: Obstetrics and Gynecology | Admitting: Obstetrics and Gynecology

## 2012-07-21 HISTORY — DX: Benign neoplasm, unspecified site: D36.9

## 2012-07-21 LAB — CBC
Hemoglobin: 14.4 g/dL (ref 12.0–15.0)
MCH: 28.2 pg (ref 26.0–34.0)
MCHC: 33 g/dL (ref 30.0–36.0)
Platelets: 210 10*3/uL (ref 150–400)

## 2012-07-21 LAB — SURGICAL PCR SCREEN
MRSA, PCR: NEGATIVE
Staphylococcus aureus: NEGATIVE

## 2012-07-25 NOTE — H&P (Signed)
Amber Ray is an 21 y.o. female G1P0010 with pelvic pain and persistent 3 cm right dermoid cyst presenting today for scheduled right ovarian cystectomy  Pertinent Gynecological History: Menses: flow is moderate Bleeding: normal Contraception: condoms DES exposure: denies Blood transfusions: none Sexually transmitted diseases: past history: gonorrhea and chalmydia Previous GYN Procedures: DNC  Last mammogram: n/a  OB History: G1, P0010   Menstrual History: No LMP recorded.    Past Medical History  Diagnosis Date  . Anxiety   . Chlamydia   . Gonorrhea   . Depression   . Bipolar affective disorder, depressed, mild   . 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 Dermoid cyst     Past Surgical History  Procedure Date  . No past surgeries     Family History  Problem Relation Age of Onset  . Kidney disease Mother   . Diabetes Maternal Grandmother   . Arthritis Maternal Grandmother   . Arthritis Maternal Grandfather   . Diabetes Paternal Grandmother   . Hypertension Paternal Grandmother   . Arthritis Paternal Grandmother   . Kidney disease Paternal Grandmother   . Arthritis Paternal Grandfather     Social History:  reports that she has been smoking.  She does not have any smokeless tobacco history on file. She reports that she drinks about .5 ounces of alcohol per week. She reports that she does not use illicit drugs.  Allergies: No Known Allergies  Prescriptions prior to admission  Medication Sig Dispense Refill  . ibuprofen (ADVIL,MOTRIN) 200 MG tablet Take 600 mg by mouth every 8 (eight) hours as needed. For pain        Review of Systems  All other systems reviewed and are negative.    Blood pressure 110/70, pulse 75, temperature 97.9 F (36.6 C), temperature source Oral, resp. rate 20, SpO2 100.00%. Physical Exam GENERAL: Well-developed, well-nourished female in no acute distress.  HEENT: Normocephalic, atraumatic. Sclerae anicteric.    NECK: Supple. Normal thyroid.  LUNGS: Clear to auscultation bilaterally.  HEART: Regular rate and rhythm. BREASTS: Symmetric in size. No palpable masses or lymphadenopathy, skin changes, or nipple drainage. ABDOMEN: Soft, nontender, nondistended. No organomegaly. PELVIC: deferred to OR EXTREMITIES: No cyanosis, clubbing, or edema, 2+ distal pulses.  Results for orders placed during the hospital encounter of 07/27/12 (from the past 24 hour(s))  PREGNANCY, URINE     Status: Normal   Collection Time   07/27/12  8:04 AM      Component Value Range   Preg Test, Ur NEGATIVE  NEGATIVE    No results found.  Assessment/Plan: 21 yo with persistent 3 cm right ovarian cyst consistent with dermoid cyst on ultrasound here for laparoscopic cystectomy and pelvic pain -Risks, benefits and alternatives were explained, including but not limited to risk of bleeding, infection and damage to adjacent organs. Patient verbalized understanding and all questions were answered.  Browning Southwood 07/27/2012, 8:26 AM

## 2012-07-27 ENCOUNTER — Encounter (HOSPITAL_COMMUNITY)
Admission: RE | Disposition: A | Discharge status: Home / Self Care | Payer: Self-pay | Source: Ambulatory Visit | Attending: Obstetrics and Gynecology

## 2012-07-27 ENCOUNTER — Encounter (HOSPITAL_COMMUNITY): Payer: Self-pay | Admitting: Anesthesiology

## 2012-07-27 ENCOUNTER — Ambulatory Visit (HOSPITAL_COMMUNITY): Payer: Self-pay | Admitting: Anesthesiology

## 2012-07-27 ENCOUNTER — Ambulatory Visit (HOSPITAL_COMMUNITY)
Admission: RE | Admit: 2012-07-27 | Discharge: 2012-07-27 | Disposition: A | Discharge status: Home / Self Care | Payer: Self-pay | Source: Ambulatory Visit | Attending: Obstetrics and Gynecology | Admitting: Obstetrics and Gynecology

## 2012-07-27 ENCOUNTER — Encounter (HOSPITAL_COMMUNITY): Payer: Self-pay | Admitting: *Deleted

## 2012-07-27 DIAGNOSIS — D279 Benign neoplasm of unspecified ovary: Secondary | ICD-10-CM | POA: Insufficient documentation

## 2012-07-27 DIAGNOSIS — D369 Benign neoplasm, unspecified site: Secondary | ICD-10-CM

## 2012-07-27 DIAGNOSIS — N949 Unspecified condition associated with female genital organs and menstrual cycle: Secondary | ICD-10-CM | POA: Insufficient documentation

## 2012-07-27 HISTORY — PX: LAPAROSCOPY: SHX197

## 2012-07-27 HISTORY — PX: OVARIAN CYST REMOVAL: SHX89

## 2012-07-27 SURGERY — LAPAROSCOPY OPERATIVE
Anesthesia: General | Site: Abdomen | Laterality: Right | Wound class: Clean Contaminated

## 2012-07-27 MED ORDER — MIDAZOLAM HCL 5 MG/5ML IJ SOLN
INTRAMUSCULAR | Status: DC | PRN
  Administered 2012-07-27: 2 mg via INTRAVENOUS

## 2012-07-27 MED ORDER — MIDAZOLAM HCL 2 MG/2ML IJ SOLN
INTRAMUSCULAR | Status: AC
  Filled 2012-07-27: qty 2

## 2012-07-27 MED ORDER — MEPERIDINE HCL 25 MG/ML IJ SOLN
12.5000 mg | Freq: Once | INTRAMUSCULAR | Status: AC
  Administered 2012-07-27: 12.5 mg via INTRAVENOUS

## 2012-07-27 MED ORDER — IBUPROFEN 200 MG PO TABS: 600.0000 mg | ORAL_TABLET | Freq: Four times a day (QID) | ORAL | Status: DC | PRN

## 2012-07-27 MED ORDER — FENTANYL CITRATE 0.05 MG/ML IJ SOLN
INTRAMUSCULAR | Status: AC
  Administered 2012-07-27: 50 ug via INTRAVENOUS
  Filled 2012-07-27: qty 2

## 2012-07-27 MED ORDER — KETOROLAC TROMETHAMINE 30 MG/ML IJ SOLN
INTRAMUSCULAR | Status: DC | PRN
  Administered 2012-07-27: 30 mg via INTRAVENOUS

## 2012-07-27 MED ORDER — DEXAMETHASONE SODIUM PHOSPHATE 10 MG/ML IJ SOLN
INTRAMUSCULAR | Status: AC
  Filled 2012-07-27: qty 1

## 2012-07-27 MED ORDER — LACTATED RINGERS IR SOLN
Status: DC | PRN
  Administered 2012-07-27: 3000 mL

## 2012-07-27 MED ORDER — DEXAMETHASONE SODIUM PHOSPHATE 4 MG/ML IJ SOLN
INTRAMUSCULAR | Status: DC | PRN
  Administered 2012-07-27: 8 mg via INTRAVENOUS

## 2012-07-27 MED ORDER — LIDOCAINE HCL (CARDIAC) 20 MG/ML IV SOLN
INTRAVENOUS | Status: AC
  Filled 2012-07-27: qty 5

## 2012-07-27 MED ORDER — ROCURONIUM BROMIDE 50 MG/5ML IV SOLN
INTRAVENOUS | Status: AC
  Filled 2012-07-27: qty 1

## 2012-07-27 MED ORDER — LIDOCAINE HCL (CARDIAC) 20 MG/ML IV SOLN
INTRAVENOUS | Status: DC | PRN
  Administered 2012-07-27: 50 mg via INTRAVENOUS

## 2012-07-27 MED ORDER — OXYCODONE-ACETAMINOPHEN 5-325 MG PO TABS
1.0000 | ORAL_TABLET | ORAL | Status: DC | PRN
Start: 2012-07-27 — End: 2012-08-04

## 2012-07-27 MED ORDER — FENTANYL CITRATE 0.05 MG/ML IJ SOLN
INTRAMUSCULAR | Status: DC | PRN
  Administered 2012-07-27: 100 ug via INTRAVENOUS
  Administered 2012-07-27 (×3): 50 ug via INTRAVENOUS

## 2012-07-27 MED ORDER — OXYCODONE-ACETAMINOPHEN 5-325 MG PO TABS
1.0000 | ORAL_TABLET | ORAL | Status: DC | PRN
  Administered 2012-07-27: 1 via ORAL

## 2012-07-27 MED ORDER — ONDANSETRON HCL 4 MG/2ML IJ SOLN
INTRAMUSCULAR | Status: DC | PRN
  Administered 2012-07-27: 4 mg via INTRAVENOUS

## 2012-07-27 MED ORDER — GLYCOPYRROLATE 0.2 MG/ML IJ SOLN
INTRAMUSCULAR | Status: AC
  Filled 2012-07-27: qty 3

## 2012-07-27 MED ORDER — BUPIVACAINE HCL (PF) 0.25 % IJ SOLN
INTRAMUSCULAR | Status: DC | PRN
  Administered 2012-07-27: 10 mL

## 2012-07-27 MED ORDER — LACTATED RINGERS IV SOLN
INTRAVENOUS | Status: DC
  Administered 2012-07-27: 08:00:00 via INTRAVENOUS

## 2012-07-27 MED ORDER — FENTANYL CITRATE 0.05 MG/ML IJ SOLN
INTRAMUSCULAR | Status: AC
  Filled 2012-07-27: qty 5

## 2012-07-27 MED ORDER — NEOSTIGMINE METHYLSULFATE 1 MG/ML IJ SOLN
INTRAMUSCULAR | Status: AC
  Filled 2012-07-27: qty 10

## 2012-07-27 MED ORDER — PROPOFOL 10 MG/ML IV EMUL
INTRAVENOUS | Status: AC
  Filled 2012-07-27: qty 20

## 2012-07-27 MED ORDER — ROCURONIUM BROMIDE 100 MG/10ML IV SOLN
INTRAVENOUS | Status: DC | PRN
  Administered 2012-07-27: 10 mg via INTRAVENOUS
  Administered 2012-07-27: 40 mg via INTRAVENOUS

## 2012-07-27 MED ORDER — MEPERIDINE HCL 25 MG/ML IJ SOLN
INTRAMUSCULAR | Status: AC
  Administered 2012-07-27: 12.5 mg via INTRAVENOUS
  Filled 2012-07-27: qty 1

## 2012-07-27 MED ORDER — DOCUSATE SODIUM 100 MG PO CAPS: 100.0000 mg | ORAL_CAPSULE | Freq: Two times a day (BID) | ORAL | Status: DC | PRN

## 2012-07-27 MED ORDER — BUPIVACAINE HCL (PF) 0.25 % IJ SOLN
INTRAMUSCULAR | Status: AC
  Filled 2012-07-27: qty 30

## 2012-07-27 MED ORDER — GLYCOPYRROLATE 0.2 MG/ML IJ SOLN
INTRAMUSCULAR | Status: DC | PRN
  Administered 2012-07-27: 0.6 mg via INTRAVENOUS

## 2012-07-27 MED ORDER — FENTANYL CITRATE 0.05 MG/ML IJ SOLN
25.0000 ug | INTRAMUSCULAR | Status: DC | PRN
  Administered 2012-07-27 (×2): 50 ug via INTRAVENOUS

## 2012-07-27 MED ORDER — OXYCODONE-ACETAMINOPHEN 5-325 MG PO TABS
ORAL_TABLET | ORAL | Status: AC
  Administered 2012-07-27: 1 via ORAL
  Filled 2012-07-27: qty 1

## 2012-07-27 MED ORDER — PROPOFOL 10 MG/ML IV EMUL
INTRAVENOUS | Status: DC | PRN
  Administered 2012-07-27: 200 mg via INTRAVENOUS

## 2012-07-27 MED ORDER — ONDANSETRON HCL 4 MG/2ML IJ SOLN
INTRAMUSCULAR | Status: AC
  Filled 2012-07-27: qty 2

## 2012-07-27 MED ORDER — NEOSTIGMINE METHYLSULFATE 1 MG/ML IJ SOLN
INTRAMUSCULAR | Status: DC | PRN
  Administered 2012-07-27: 3 mg via INTRAVENOUS

## 2012-07-27 MED ORDER — KETOROLAC TROMETHAMINE 30 MG/ML IJ SOLN
INTRAMUSCULAR | Status: AC
  Filled 2012-07-27: qty 1

## 2012-07-27 SURGICAL SUPPLY — 29 items
ADH SKN CLS APL DERMABOND .7 (GAUZE/BANDAGES/DRESSINGS) ×2
BAG SPEC RTRVL LRG 6X4 10 (ENDOMECHANICALS) ×2
CABLE HIGH FREQUENCY MONO STRZ (ELECTRODE) ×1 IMPLANT
CHLORAPREP W/TINT 26ML (MISCELLANEOUS) ×3 IMPLANT
DERMABOND ADVANCED (GAUZE/BANDAGES/DRESSINGS) ×1
DERMABOND ADVANCED .7 DNX12 (GAUZE/BANDAGES/DRESSINGS) ×2 IMPLANT
ELECT REM PT RETURN 9FT ADLT (ELECTROSURGICAL) ×3
ELECTRODE REM PT RTRN 9FT ADLT (ELECTROSURGICAL) IMPLANT
GLOVE BIO SURGEON STRL SZ7 (GLOVE) ×1 IMPLANT
GLOVE BIOGEL PI IND STRL 6.5 (GLOVE) ×4 IMPLANT
GLOVE BIOGEL PI IND STRL 7.0 (GLOVE) IMPLANT
GLOVE BIOGEL PI INDICATOR 6.5 (GLOVE) ×3
GLOVE BIOGEL PI INDICATOR 7.0 (GLOVE) ×1
GLOVE ECLIPSE 6.0 STRL STRAW (GLOVE) ×1 IMPLANT
GLOVE SURG SS PI 6.0 STRL IVOR (GLOVE) ×3 IMPLANT
GOWN PREVENTION PLUS LG XLONG (DISPOSABLE) ×6 IMPLANT
GOWN STRL REIN XL XLG (GOWN DISPOSABLE) ×1 IMPLANT
NS IRRIG 1000ML POUR BTL (IV SOLUTION) ×3 IMPLANT
PACK LAPAROSCOPY BASIN (CUSTOM PROCEDURE TRAY) ×3 IMPLANT
POUCH SPECIMEN RETRIEVAL 10MM (ENDOMECHANICALS) ×1 IMPLANT
PROTECTOR NERVE ULNAR (MISCELLANEOUS) ×4 IMPLANT
SET IRRIG TUBING LAPAROSCOPIC (IRRIGATION / IRRIGATOR) ×1 IMPLANT
SUT MNCRL AB 4-0 PS2 18 (SUTURE) ×3 IMPLANT
SUT VICRYL 0 UR6 27IN ABS (SUTURE) ×5 IMPLANT
TOWEL OR 17X24 6PK STRL BLUE (TOWEL DISPOSABLE) ×6 IMPLANT
TROCAR BALLN 12MMX100 BLUNT (TROCAR) ×1 IMPLANT
TROCAR XCEL NON-BLD 11X100MML (ENDOMECHANICALS) ×1 IMPLANT
TROCAR XCEL NON-BLD 5MMX100MML (ENDOMECHANICALS) ×1 IMPLANT
WATER STERILE IRR 1000ML POUR (IV SOLUTION) ×3 IMPLANT

## 2012-07-27 NOTE — Anesthesia Postprocedure Evaluation (Signed)
  Anesthesia Post-op Note  Patient: Amber Ray  Procedure(s) Performed: Procedure(s) (LRB) with comments: LAPAROSCOPY OPERATIVE (N/A) OVARIAN CYSTECTOMY (Right) - Dermoid Cyst Patient is awake and responsive. Pain and nausea are reasonably well controlled. Vital signs are stable and clinically acceptable. Oxygen saturation is clinically acceptable. There are no apparent anesthetic complications at this time. Patient is ready for discharge.

## 2012-07-27 NOTE — Anesthesia Preprocedure Evaluation (Signed)
Anesthesia Evaluation Anesthesia Physical Anesthesia Plan  ASA: II  Anesthesia Plan: General   Post-op Pain Management:    Induction: Intravenous  Airway Management Planned: Oral ETT  Additional Equipment:   Intra-op Plan:   Post-operative Plan:   Informed Consent: I have reviewed the patients History and Physical, chart, labs and discussed the procedure including the risks, benefits and alternatives for the proposed anesthesia with the patient or authorized representative who has indicated his/her understanding and acceptance.   Dental Advisory Given and Dental advisory given  Plan Discussed with: CRNA and Surgeon  Anesthesia Plan Comments: (  Discussed  general anesthesia, including possible nausea, instrumentation of airway, sore throat,pulmonary aspiration, etc. I asked if the were any outstanding questions, or  concerns before we proceeded. )        Anesthesia Quick Evaluation  

## 2012-07-27 NOTE — Transfer of Care (Signed)
Immediate Anesthesia Transfer of Care Note  Patient: Amber Ray  Procedure(s) Performed: Procedure(s) (LRB) with comments: LAPAROSCOPY OPERATIVE (N/A) OVARIAN CYSTECTOMY (Right) - Dermoid Cyst  Patient Location: PACU  Anesthesia Type: General  Level of Consciousness: awake, alert  and patient cooperative  Airway & Oxygen Therapy: Patient Spontanous Breathing and Patient connected to nasal cannula oxygen  Post-op Assessment: Report given to PACU RN and Post -op Vital signs reviewed and stable  Post vital signs: Reviewed and stable  Complications: No apparent anesthesia complications

## 2012-07-27 NOTE — Op Note (Addendum)
Amber Ray PROCEDURE DATE: 07/27/2012  PREOPERATIVE DIAGNOSIS: Pelvic pain and right 3 cm dermoid ovarian cyst POSTOPERATIVE DIAGNOSIS: right ovarian dermoid cyst PROCEDURE: Laparoscopic right ovarian cystectomy SURGEON:  Dr. Catalina Antigua ASSISTANT: Dr. Erin Fulling ANESTHESIOLOGIST: Jiles Garter, MD   INDICATIONS: 21 y.o. G1P0010 with pelvic pain and a 3 cm right ovarian dermoid cyst. Patient was informed of definitive treatment with cystectomy and wished to proceed. Risks of surgery including bleeding which may require transfusion or reoperation, infection, injury to bowel or other surrounding organs, need for additional procedures including laparotomy and other postoperative/anesthesia complications were explained to patient.  Written informed consent was obtained.  FINDINGS:  Enlarged right ovary with dermoid cyst. Small normal appearing uterus, normal right and left fallopian tube with small bilateral paratubal cyst, left ovary. Omental adhesion noted at the umbilicus.  ANESTHESIA: General INTRAVENOUS FLUIDS: .1900 ml ESTIMATED BLOOD LOSS:  50 ml URINE OUTPUT: 200 ml SPECIMENS: right ovarian cyst COMPLICATIONS: None immediate  PROCEDURE IN DETAIL:  The patient received intravenous antibiotics and had sequential compression devices applied to her lower extremities while in the preoperative area.  She was then taken to the operating room where general anesthesia was administered and was found to be adequate.  She was placed in the dorsal lithotomy position, and was prepped and draped in a sterile manner.  A Foley catheter was inserted into her bladder and attached to [name] drainage and a uterine manipulator was then advanced into the uterus .  After an adequate timeout was performed, attention was then turned to the patient's abdomen where a 10-mm skin incision was made on the umbilical fold.  The underlying fascia was grasped with kocher clamps and incised. The fascia was  tagged with 0-Vicryl. The peritoneum was grasped with kelly clamps and entered sharply with Metzenbaum scissors.  The 10-mm trocar and sleeve were then advanced without difficulty into the abdomen where intraabdominal placement was confirmed by the laparoscope. A survey of the patient's pelvis and abdomen revealed the findings as above.  Bilateral 5-mm lower quadrant ports  were then placed under direct visualization.   The right ovary was grasped with an atraumatic grasper and using the endoshears, the capsule was incised.  Using a combination of sharp and blunt dissections, the 3 cm ovarian cyst was separated from the ovary. It was placed in an Endocatch bag and removed from the patient's abdomen. Copious irrigation of the abdomen was then performed. Hemostasis was visualized in the dissected surface of the right ovary. No intraoperative injury to other surrounding organs was noted.  The abdomen was desufflated and all instruments were then removed from the patient's abdomen. The fascial incision of the umbilicus was closed with a 0 Vicryl figure of eight stitch.  All skin incisions were closed with 3-0 Vicryl subcuticular stitches/Dermabond.   The patient will be discharged to home as per PACU criteria.  Routine postoperative instructions given.  She was prescribed Percocet, Ibuprofen and Colace.  She will follow up in GYN clinic for postoperative evaluation .

## 2012-07-28 ENCOUNTER — Encounter (HOSPITAL_COMMUNITY): Payer: Self-pay | Admitting: Obstetrics and Gynecology

## 2012-07-29 ENCOUNTER — Emergency Department (HOSPITAL_COMMUNITY)
Admission: EM | Admit: 2012-07-29 | Discharge: 2012-07-30 | Disposition: A | Discharge status: Home / Self Care | Payer: Self-pay | Attending: Emergency Medicine | Admitting: Emergency Medicine

## 2012-07-29 ENCOUNTER — Encounter (HOSPITAL_COMMUNITY): Payer: Self-pay | Admitting: *Deleted

## 2012-07-29 DIAGNOSIS — K5900 Constipation, unspecified: Secondary | ICD-10-CM | POA: Insufficient documentation

## 2012-07-29 DIAGNOSIS — F319 Bipolar disorder, unspecified: Secondary | ICD-10-CM | POA: Insufficient documentation

## 2012-07-29 DIAGNOSIS — F411 Generalized anxiety disorder: Secondary | ICD-10-CM | POA: Insufficient documentation

## 2012-07-29 DIAGNOSIS — R142 Eructation: Secondary | ICD-10-CM | POA: Insufficient documentation

## 2012-07-29 DIAGNOSIS — R1031 Right lower quadrant pain: Secondary | ICD-10-CM | POA: Insufficient documentation

## 2012-07-29 DIAGNOSIS — R141 Gas pain: Secondary | ICD-10-CM | POA: Insufficient documentation

## 2012-07-29 DIAGNOSIS — G8918 Other acute postprocedural pain: Secondary | ICD-10-CM | POA: Insufficient documentation

## 2012-07-29 DIAGNOSIS — Y838 Other surgical procedures as the cause of abnormal reaction of the patient, or of later complication, without mention of misadventure at the time of the procedure: Secondary | ICD-10-CM | POA: Insufficient documentation

## 2012-07-29 DIAGNOSIS — IMO0002 Reserved for concepts with insufficient information to code with codable children: Secondary | ICD-10-CM | POA: Insufficient documentation

## 2012-07-29 DIAGNOSIS — F17200 Nicotine dependence, unspecified, uncomplicated: Secondary | ICD-10-CM | POA: Insufficient documentation

## 2012-07-29 NOTE — ED Notes (Signed)
Pt reports having laproscopic cystectomy done on Wednesday.  Reports that she is having continued drainage from lap site.  Small amount serosanguinous drainage noted at triage.  Pt also reports that she hasn't had BM since being home and continues to feel bloated and constipated despite taking stool softeners, laxatives and Gas-X.   Pt reporting that Percocet and Ibuprofen not effective in pain control.

## 2012-07-30 ENCOUNTER — Emergency Department (HOSPITAL_COMMUNITY): Payer: Self-pay

## 2012-07-30 LAB — CBC WITH DIFFERENTIAL/PLATELET
Basophils Absolute: 0.1 10*3/uL (ref 0.0–0.1)
Basophils Relative: 1 % (ref 0–1)
Eosinophils Absolute: 0.3 10*3/uL (ref 0.0–0.7)
HCT: 39.9 % (ref 36.0–46.0)
MCH: 29.2 pg (ref 26.0–34.0)
MCHC: 33.8 g/dL (ref 30.0–36.0)
Monocytes Absolute: 0.7 10*3/uL (ref 0.1–1.0)
Neutro Abs: 4.7 10*3/uL (ref 1.7–7.7)
RDW: 13.3 % (ref 11.5–15.5)

## 2012-07-30 LAB — COMPREHENSIVE METABOLIC PANEL
AST: 26 U/L (ref 0–37)
Albumin: 3.4 g/dL — ABNORMAL LOW (ref 3.5–5.2)
BUN: 8 mg/dL (ref 6–23)
Calcium: 9 mg/dL (ref 8.4–10.5)
Chloride: 98 mEq/L (ref 96–112)
Creatinine, Ser: 0.74 mg/dL (ref 0.50–1.10)
Total Bilirubin: 0.1 mg/dL — ABNORMAL LOW (ref 0.3–1.2)
Total Protein: 6.7 g/dL (ref 6.0–8.3)

## 2012-07-30 LAB — URINE MICROSCOPIC-ADD ON

## 2012-07-30 LAB — URINALYSIS, ROUTINE W REFLEX MICROSCOPIC
Glucose, UA: NEGATIVE mg/dL
Leukocytes, UA: NEGATIVE
Specific Gravity, Urine: <1.005 — ABNORMAL LOW (ref 1.005–1.030)
pH: 5.5 (ref 5.0–8.0)

## 2012-07-30 LAB — PREGNANCY, URINE: Preg Test, Ur: NEGATIVE

## 2012-07-30 MED ORDER — SODIUM CHLORIDE 0.9 % IV BOLUS (SEPSIS)
1000.0000 mL | Freq: Once | INTRAVENOUS | Status: AC
  Administered 2012-07-30: 1000 mL via INTRAVENOUS

## 2012-07-30 MED ORDER — IOHEXOL 300 MG/ML  SOLN
100.0000 mL | Freq: Once | INTRAMUSCULAR | Status: AC | PRN
  Administered 2012-07-30: 100 mL via INTRAVENOUS

## 2012-07-30 MED ORDER — OXYCODONE-ACETAMINOPHEN 5-325 MG PO TABS
2.0000 | ORAL_TABLET | Freq: Once | ORAL | Status: AC
  Administered 2012-07-30: 2 via ORAL
  Filled 2012-07-30: qty 2

## 2012-07-30 MED ORDER — MORPHINE SULFATE 4 MG/ML IJ SOLN
4.0000 mg | Freq: Once | INTRAMUSCULAR | Status: AC
  Administered 2012-07-30: 4 mg via INTRAVENOUS
  Filled 2012-07-30: qty 1

## 2012-07-30 MED ORDER — SODIUM CHLORIDE 0.9 % IV SOLN: Freq: Once | INTRAVENOUS | Status: DC

## 2012-07-30 NOTE — ED Provider Notes (Signed)
21yo F, c/o right lower lap surgical site "opened" and "bleeding" that began PTA.  Pt s/p laparoscopic right ovarian cystectomy 2 days ago.  States after she showered she noticed the bleeding.  Also c/o post-op pain and constipation since the surgery; has been taking laxative and stool softener without results.  VSS, NAD, abd soft/tender around surgical sites, RLQ approx 1cm linear wound open and intermittently oozing serosanguinous fluid, no purulence, no erythema, no edema, no fluctuance.   0245:   WBC normal, no abscess (either intra-abd or SQ) on CT scan.  Will cover with DSD at this time.  No signs of infection at this time, will hold abx.  Will have pt continue stool softener, and increase her percocet to 2 tablets every 4 to 6 hours prn pain (she has only been taking 1 tab). T/C to OB/GYN Dr. Emelda Fear, case discussed, including:  HPI, pertinent PM/SHx, VS/PE, dx testing, ED course and treatment:  Agreeable with ED plan, requests to have pt f/u in clinic this week; if she has any other concerns sooner than that she can be seen at [facility] tomorrow (he is working then).  Dx testing, as well as d/w OB/GYN, d/w pt and family.  Questions answered.  Verb understanding, agreeable to d/c home with outpt f/u.    Results for orders placed during the hospital encounter of 07/29/12  CBC WITH DIFFERENTIAL      Component Value Range   WBC 8.9  4.0 - 10.5 K/uL   RBC 4.62  3.87 - 5.11 MIL/uL   Hemoglobin 13.5  12.0 - 15.0 g/dL   HCT 96.0  45.4 - 09.8 %   MCV 86.4  78.0 - 100.0 fL   MCH 29.2  26.0 - 34.0 pg   MCHC 33.8  30.0 - 36.0 g/dL   RDW 11.9  14.7 - 82.9 %   Platelets 197  150 - 400 K/uL   Neutrophils Relative 53  43 - 77 %   Neutro Abs 4.7  1.7 - 7.7 K/uL   Lymphocytes Relative 35  12 - 46 %   Lymphs Abs 3.1  0.7 - 4.0 K/uL   Monocytes Relative 7  3 - 12 %   Monocytes Absolute 0.7  0.1 - 1.0 K/uL   Eosinophils Relative 3  0 - 5 %   Eosinophils Absolute 0.3  0.0 - 0.7 K/uL   Basophils  Relative 1  0 - 1 %   Basophils Absolute 0.1  0.0 - 0.1 K/uL  COMPREHENSIVE METABOLIC PANEL      Component Value Range   Sodium 134 (*) 135 - 145 mEq/L   Potassium 3.8  3.5 - 5.1 mEq/L   Chloride 98  96 - 112 mEq/L   CO2 28  19 - 32 mEq/L   Glucose, Bld 89  70 - 99 mg/dL   BUN 8  6 - 23 mg/dL   Creatinine, Ser 5.62  0.50 - 1.10 mg/dL   Calcium 9.0  8.4 - 13.0 mg/dL   Total Protein 6.7  6.0 - 8.3 g/dL   Albumin 3.4 (*) 3.5 - 5.2 g/dL   AST 26  0 - 37 U/L   ALT 20  0 - 35 U/L   Alkaline Phosphatase 78  39 - 117 U/L   Total Bilirubin 0.1 (*) 0.3 - 1.2 mg/dL   GFR calc non Af Amer >90  >90 mL/min   GFR calc Af Amer >90  >90 mL/min  URINALYSIS, ROUTINE W REFLEX MICROSCOPIC  Component Value Range   Color, Urine YELLOW  YELLOW   APPearance CLEAR  CLEAR   Specific Gravity, Urine <1.005 (*) 1.005 - 1.030   pH 5.5  5.0 - 8.0   Glucose, UA NEGATIVE  NEGATIVE mg/dL   Hgb urine dipstick LARGE (*) NEGATIVE   Bilirubin Urine NEGATIVE  NEGATIVE   Ketones, ur NEGATIVE  NEGATIVE mg/dL   Protein, ur TRACE (*) NEGATIVE mg/dL   Urobilinogen, UA 0.2  0.0 - 1.0 mg/dL   Nitrite NEGATIVE  NEGATIVE   Leukocytes, UA NEGATIVE  NEGATIVE  PREGNANCY, URINE      Component Value Range   Preg Test, Ur NEGATIVE  NEGATIVE  URINE MICROSCOPIC-ADD ON      Component Value Range   Squamous Epithelial / LPF FEW (*) RARE   WBC, UA 0-2  <3 WBC/hpf   RBC / HPF TOO NUMEROUS TO COUNT  <3 RBC/hpf   Bacteria, UA FEW (*) RARE   Ct Abdomen Pelvis W Contrast 07/30/2012  *RADIOLOGY REPORT*  Clinical Data: Drainage from surgical incision  CT ABDOMEN AND PELVIS WITH CONTRAST  Technique:  Multidetector CT imaging of the abdomen and pelvis was performed following the standard protocol during bolus administration of intravenous contrast.  Contrast: OMNIPAQUE IOHEXOL 300 MG/ML  SOLN  Comparison: 08/24/2011  Findings: Lung bases are clear.  No pericardial or pleural effusion.  There is no focal liver abnormality.  The  gallbladder appears normal.  No biliary dilatation.  The pancreas appears normal. Normal appearance of the spleen.  Both adrenal glands are normal.  The right kidney is normal.  The left kidney is normal.  The urinary bladder is unremarkable. The uterus and the adnexal structures have a normal physiologic appearance for patient's age.  No enlarged upper abdominal lymph nodes.  There are no enlarged pelvic or inguinal lymph nodes.  There is no free fluid or fluid collections within the upper abdomen or the pelvis.  The stomach appears normal.  The small bowel loops are unremarkable.  The appendix is visualized and appears normal. Normal appearance of the colon.  Postoperative changes in the region of the umbilicus noted.  There is a small amount of gas within the subcutaneous soft tissues, image 62 of the coronal series.  There is subcutaneous fat stranding and mild skin thickening but no focal fluid collections identified.  Review of the visualized osseous structures is unremarkable.  IMPRESSION:  1.  No evidence for abscess. 2.  Postoperative changes within the periumbilical ventral abdominal wall.  There is subcutaneous fat stranding, skin thickening and gas.  No fluid collection noted.   Original Report Authenticated By: Rosealee Albee, M.D.       Laray Anger, DO 07/30/12 [place]

## 2012-07-30 NOTE — ED Provider Notes (Signed)
Medical screening examination/treatment/procedure(s) were conducted as a shared visit with non-physician practitioner(s) and myself.  I personally evaluated the patient during the encounter.  See my separate note.   Laray Anger, DO 07/30/12 1846

## 2012-07-30 NOTE — ED Provider Notes (Signed)
History     CSN: [id number]  Arrival date & time 07/29/12  2333   First MD Initiated Contact with Patient 07/29/12 2350      Chief Complaint  Patient presents with  . Wound Check    (Consider location/radiation/quality/duration/timing/severity/associated sxs/prior treatment) HPI Comments: Patient is 2 days post-op from a laparoscopic right ovarian cystectomy and comes to the ED c/o bloody drainage from one of the incision sites.  States she noticed the drainage around 8:00 pm tonight.  She states that she contacted the nurse line at [facility] hospital and was advised to come to the ED for further evaluation.  She also c/o persistent right lower abdominal pain, bloating and constipation.  States abdominal pain has been present since the surgery.  She states that she is having flatus but has not had a BM for 2 days.  Has taken Doculax yesterday and today without relief along with stool softners.  Patient states that she was prescribed Percocet for her abdominal pain, but it has not been effective in controlling the pain.  She denies fever, chills, urinary sx's, vomiting or decreased appetite.     The history is provided by the patient.    Past Medical History  Diagnosis Date  . Anxiety   . Chlamydia   . Gonorrhea   . Depression   . Bipolar affective disorder, depressed, mild   . 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 Dermoid cyst     Past Surgical History  Procedure Date  . No past surgeries   . Laparoscopy 07/27/2012    Procedure: LAPAROSCOPY OPERATIVE;  Surgeon: Catalina Antigua, MD;  Location: [facility];  Service: Gynecology;  Laterality: N/A;  . Ovarian cyst removal 07/27/2012    Procedure: OVARIAN CYSTECTOMY;  Surgeon: Catalina Antigua, MD;  Location: [facility];  Service: Gynecology;  Laterality: Right;  Dermoid Cyst    Family History  Problem Relation Age of Onset  . Kidney disease Mother   . Diabetes Maternal Grandmother   . Arthritis Maternal Grandmother   .  Arthritis Maternal Grandfather   . Diabetes Paternal Grandmother   . Hypertension Paternal Grandmother   . Arthritis Paternal Grandmother   . Kidney disease Paternal Grandmother   . Arthritis Paternal Grandfather     History  Substance Use Topics  . Smoking status: Current Every Day Smoker -- 0.5 packs/day for 2 years  . Smokeless tobacco: Not on file  . Alcohol Use: 0.5 oz/week    1 drink(s) per week     occasionally    OB History    Grav Para Term Preterm Abortions TAB SAB Ect Mult Living   1    1  1    0      Review of Systems  Constitutional: Negative for fever, chills and appetite change.  Respiratory: Negative for shortness of breath.   Cardiovascular: Negative for chest pain.  Gastrointestinal: Positive for nausea, vomiting, abdominal pain and constipation. Negative for blood in stool.  Genitourinary: Negative for dysuria, frequency, flank pain, decreased urine volume, vaginal bleeding, vaginal discharge and difficulty urinating.  Musculoskeletal: Negative for back pain.  Skin: Positive for wound. Negative for color change and rash.  Neurological: Negative for dizziness, weakness and numbness.  Hematological: Negative for adenopathy.  All other systems reviewed and are negative.    Allergies  Review of patient's allergies indicates no known allergies.  Home Medications   Current Outpatient Rx  Name Route Sig Dispense Refill  .  DOCUSATE SODIUM 100 MG PO CAPS Oral Take 1 capsule (100 mg total) by mouth 2 (two) times daily as needed for constipation. 30 capsule 2  . IBUPROFEN 200 MG PO TABS Oral Take 3 tablets (600 mg total) by mouth every 6 (six) hours as needed. For pain 30 tablet 2  . OXYCODONE-ACETAMINOPHEN 5-325 MG PO TABS Oral Take 1 tablet by mouth every 4 (four) hours as needed for pain. 20 tablet 0    BP 125/79  Pulse 67  Temp 98.8 F (37.1 C) (Oral)  Resp 18  Ht 5\' 4"  (1.626 m)  Wt 177 lb (80.287 kg)  BMI 30.38 kg/m2  SpO2 98%  LMP  06/06/2012  Physical Exam  Nursing note and vitals reviewed. Constitutional: She is oriented to person, place, and time. She appears well-developed and well-nourished. No distress.  HENT:  Head: Normocephalic and atraumatic.  Mouth/Throat: Oropharynx is clear and moist.  Cardiovascular: Normal rate, regular rhythm, normal heart sounds and intact distal pulses.   No murmur heard. Pulmonary/Chest: Effort normal and breath sounds normal. No respiratory distress.  Abdominal: Soft. Bowel sounds are normal. She exhibits no distension. There is tenderness. There is no rebound and no guarding.         1 cm incision to the RLQ with small amt of serosanguinous fluid draining from the incision when bandage was removed.   Abdomen is otherwise soft, BS are present x 4, no pus, erythema or excessive warmth.  Remaining incisions appear to be healing well  Musculoskeletal: Normal range of motion.  Neurological: She is oriented to person, place, and time. Coordination normal.  Skin: Skin is warm and dry.    ED Course  Procedures (including critical care time)  Results for orders placed during the hospital encounter of 07/29/12  CBC WITH DIFFERENTIAL      Component Value Range   WBC 8.9  4.0 - 10.5 K/uL   RBC 4.62  3.87 - 5.11 MIL/uL   Hemoglobin 13.5  12.0 - 15.0 g/dL   HCT 16.1  09.6 - 04.5 %   MCV 86.4  78.0 - 100.0 fL   MCH 29.2  26.0 - 34.0 pg   MCHC 33.8  30.0 - 36.0 g/dL   RDW 40.9  81.1 - 91.4 %   Platelets 197  150 - 400 K/uL   Neutrophils Relative 53  43 - 77 %   Neutro Abs 4.7  1.7 - 7.7 K/uL   Lymphocytes Relative 35  12 - 46 %   Lymphs Abs 3.1  0.7 - 4.0 K/uL   Monocytes Relative 7  3 - 12 %   Monocytes Absolute 0.7  0.1 - 1.0 K/uL   Eosinophils Relative 3  0 - 5 %   Eosinophils Absolute 0.3  0.0 - 0.7 K/uL   Basophils Relative 1  0 - 1 %   Basophils Absolute 0.1  0.0 - 0.1 K/uL  COMPREHENSIVE METABOLIC PANEL      Component Value Range   Sodium 134 (*) 135 - 145 mEq/L    Potassium 3.8  3.5 - 5.1 mEq/L   Chloride 98  96 - 112 mEq/L   CO2 28  19 - 32 mEq/L   Glucose, Bld 89  70 - 99 mg/dL   BUN 8  6 - 23 mg/dL   Creatinine, Ser 7.82  0.50 - 1.10 mg/dL   Calcium 9.0  8.4 - 95.6 mg/dL   Total Protein 6.7  6.0 - 8.3 g/dL   Albumin 3.4 (*)  3.5 - 5.2 g/dL   AST 26  0 - 37 U/L   ALT 20  0 - 35 U/L   Alkaline Phosphatase 78  39 - 117 U/L   Total Bilirubin 0.1 (*) 0.3 - 1.2 mg/dL   GFR calc non Af Amer >90  >90 mL/min   GFR calc Af Amer >90  >90 mL/min  URINALYSIS, ROUTINE W REFLEX MICROSCOPIC      Component Value Range   Color, Urine YELLOW  YELLOW   APPearance CLEAR  CLEAR   Specific Gravity, Urine <1.005 (*) 1.005 - 1.030   pH 5.5  5.0 - 8.0   Glucose, UA NEGATIVE  NEGATIVE mg/dL   Hgb urine dipstick LARGE (*) NEGATIVE   Bilirubin Urine NEGATIVE  NEGATIVE   Ketones, ur NEGATIVE  NEGATIVE mg/dL   Protein, ur TRACE (*) NEGATIVE mg/dL   Urobilinogen, UA 0.2  0.0 - 1.0 mg/dL   Nitrite NEGATIVE  NEGATIVE   Leukocytes, UA NEGATIVE  NEGATIVE  PREGNANCY, URINE      Component Value Range   Preg Test, Ur NEGATIVE  NEGATIVE  URINE MICROSCOPIC-ADD ON      Component Value Range   Squamous Epithelial / LPF FEW (*) RARE   WBC, UA 0-2  <3 WBC/hpf   RBC / HPF TOO NUMEROUS TO COUNT  <3 RBC/hpf   Bacteria, UA FEW (*) RARE         MDM    1:31 AM End of shift.  patient is resting comfortably at this time.  Waiting to go to CT.  I have discussed pt hx with the EDP who will provide further care for this patient.      Annalei Friesz L. [place], [place] 07/30/12 [phone]

## 2012-07-30 NOTE — ED Notes (Signed)
Pt drinking contrast at this time. Pt also given a pair of socks.

## 2012-08-04 ENCOUNTER — Inpatient Hospital Stay (HOSPITAL_COMMUNITY)
Admission: AD | Admit: 2012-08-04 | Discharge: 2012-08-04 | Disposition: A | Discharge status: Home / Self Care | Payer: Self-pay | Source: Ambulatory Visit | Attending: Obstetrics & Gynecology | Admitting: Obstetrics & Gynecology

## 2012-08-04 ENCOUNTER — Encounter (HOSPITAL_COMMUNITY): Payer: Self-pay | Admitting: *Deleted

## 2012-08-04 DIAGNOSIS — G8918 Other acute postprocedural pain: Secondary | ICD-10-CM

## 2012-08-04 DIAGNOSIS — R109 Unspecified abdominal pain: Secondary | ICD-10-CM | POA: Insufficient documentation

## 2012-08-04 DIAGNOSIS — M549 Dorsalgia, unspecified: Secondary | ICD-10-CM | POA: Insufficient documentation

## 2012-08-04 LAB — URINALYSIS, ROUTINE W REFLEX MICROSCOPIC
Glucose, UA: NEGATIVE mg/dL
Ketones, ur: NEGATIVE mg/dL
Leukocytes, UA: NEGATIVE
Protein, ur: NEGATIVE mg/dL
Urobilinogen, UA: 0.2 mg/dL (ref 0.0–1.0)

## 2012-08-04 LAB — CBC
Hemoglobin: 14.3 g/dL (ref 12.0–15.0)
MCH: 28 pg (ref 26.0–34.0)
MCHC: 33.3 g/dL (ref 30.0–36.0)
RDW: 13.1 % (ref 11.5–15.5)

## 2012-08-04 MED ORDER — TRAMADOL HCL 50 MG PO TABS: 50.0000 mg | ORAL_TABLET | Freq: Four times a day (QID) | ORAL | Status: DC | PRN

## 2012-08-04 MED ORDER — OXYCODONE-ACETAMINOPHEN 5-325 MG PO TABS
2.0000 | ORAL_TABLET | Freq: Once | ORAL | Status: AC
  Administered 2012-08-04: 2 via ORAL
  Filled 2012-08-04: qty 2

## 2012-08-04 MED ORDER — KETOROLAC TROMETHAMINE 60 MG/2ML IM SOLN
60.0000 mg | Freq: Once | INTRAMUSCULAR | Status: AC
  Administered 2012-08-04: 60 mg via INTRAMUSCULAR
  Filled 2012-08-04: qty 2

## 2012-08-04 MED ORDER — SIMETHICONE 80 MG PO CHEW
160.0000 mg | CHEWABLE_TABLET | Freq: Once | ORAL | Status: AC
  Administered 2012-08-04: 160 mg via ORAL
  Filled 2012-08-04: qty 2

## 2012-08-04 NOTE — MAU Note (Signed)
Pt had laparoscopic cystectomy on 9/18. Pt c/o lower abd and back pain for past 2 days. Pain in ribs as well. Ran out of percocet taking ibuprofen without much relief.

## 2012-08-04 NOTE — MAU Provider Note (Signed)
History     CSN: [id number]  Arrival date and time: 08/04/12 1649   First Provider Initiated Contact with Patient 08/04/12 1732      Chief Complaint  Patient presents with  . Post-op Problem   HPI 21 y.o. G1P0010 1 week s/p laparoscopic removal of right dermoid ovarian cyst. C/O abdominal pain, shooting through vaginal area, low back pain and pain under ribs.+ BM and flatus at home, denies fever, n/v, dysuria, bleeding.   Past Medical History  Diagnosis Date  . Anxiety   . Chlamydia   . Gonorrhea   . Depression   . Bipolar affective disorder, depressed, mild   . 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 Dermoid cyst     Past Surgical History  Procedure Date  . No past surgeries   . Laparoscopy 07/27/2012    Procedure: LAPAROSCOPY OPERATIVE;  Surgeon: Catalina Antigua, MD;  Location: [facility];  Service: Gynecology;  Laterality: N/A;  . Ovarian cyst removal 07/27/2012    Procedure: OVARIAN CYSTECTOMY;  Surgeon: Catalina Antigua, MD;  Location: [facility];  Service: Gynecology;  Laterality: Right;  Dermoid Cyst    Family History  Problem Relation Age of Onset  . Kidney disease Mother   . Diabetes Maternal Grandmother   . Arthritis Maternal Grandmother   . Arthritis Maternal Grandfather   . Diabetes Paternal Grandmother   . Hypertension Paternal Grandmother   . Arthritis Paternal Grandmother   . Kidney disease Paternal Grandmother   . Arthritis Paternal Grandfather     History  Substance Use Topics  . Smoking status: Current Every Day Smoker -- 0.5 packs/day for 2 years  . Smokeless tobacco: Not on file  . Alcohol Use: 0.5 oz/week    1 drink(s) per week     occasionally    Allergies: No Known Allergies  Prescriptions prior to admission  Medication Sig Dispense Refill  . docusate sodium (COLACE) 100 MG capsule Take 1 capsule (100 mg total) by mouth 2 (two) times daily as needed for constipation.  30 capsule  2  . ibuprofen (ADVIL,MOTRIN) 200 MG tablet  Take 3 tablets (600 mg total) by mouth every 6 (six) hours as needed. For pain  30 tablet  2    Review of Systems  Constitutional: Negative.  Negative for fever and chills.  Respiratory: Negative.   Cardiovascular: Negative.   Gastrointestinal: Positive for abdominal pain. Negative for nausea, vomiting, diarrhea and constipation.  Genitourinary: Negative for dysuria, urgency, frequency, hematuria and flank pain.       Negative for vaginal bleeding, vaginal discharge  Musculoskeletal: Negative.   Neurological: Negative.   Psychiatric/Behavioral: Negative.    Physical Exam   Blood pressure 107/64, pulse 104, temperature 98.7 F (37.1 C), temperature source Oral, resp. rate 18, height 5\' 4"  (1.626 m), weight 179 lb 12.8 oz (81.557 kg), last menstrual period 06/06/2012.  Physical Exam  Nursing note and vitals reviewed. Constitutional: She is oriented to person, place, and time. She appears well-developed and well-nourished. No distress.  Cardiovascular: Normal rate and regular rhythm.   Respiratory: Effort normal.  GI: Soft. Bowel sounds are normal. She exhibits no distension and no mass. There is tenderness (diffuse, mild). There is no rebound and no guarding.  Musculoskeletal: Normal range of motion.  Neurological: She is alert and oriented to person, place, and time.  Skin: Skin is warm and dry.  Psychiatric: She has a normal mood and affect.    MAU Course  Procedures Results for orders placed during the hospital encounter of 08/04/12 (from the past 24 hour(s))  URINALYSIS, ROUTINE W REFLEX MICROSCOPIC     Status: Normal   Collection Time   08/04/12  5:00 PM      Component Value Range   Color, Urine YELLOW  YELLOW   APPearance CLEAR  CLEAR   Specific Gravity, Urine 1.015  1.005 - 1.030   pH 7.0  5.0 - 8.0   Glucose, UA NEGATIVE  NEGATIVE mg/dL   Hgb urine dipstick NEGATIVE  NEGATIVE   Bilirubin Urine NEGATIVE  NEGATIVE   Ketones, ur NEGATIVE  NEGATIVE mg/dL   Protein, ur  NEGATIVE  NEGATIVE mg/dL   Urobilinogen, UA 0.2  0.0 - 1.0 mg/dL   Nitrite NEGATIVE  NEGATIVE   Leukocytes, UA NEGATIVE  NEGATIVE  CBC     Status: Abnormal   Collection Time   08/04/12  5:37 PM      Component Value Range   WBC 10.8 (*) 4.0 - 10.5 K/uL   RBC 5.11  3.87 - 5.11 MIL/uL   Hemoglobin 14.3  12.0 - 15.0 g/dL   HCT 16.1  09.6 - 04.5 %   MCV 84.0  78.0 - 100.0 fL   MCH 28.0  26.0 - 34.0 pg   MCHC 33.3  30.0 - 36.0 g/dL   RDW 40.9  81.1 - 91.4 %   Platelets 236  150 - 400 K/uL   Some relief with Toradol 60 mg IM, Percocet 5/325 #2 given in MAU prior to d/c  Assessment and Plan  21 y.o. G1P0010 with pain s/p laparoscopic cystectomy    Medication List     As of 08/07/2012  1:44 AM    START taking these medications         traMADol 50 MG tablet   Commonly known as: ULTRAM   Take 1 tablet (50 mg total) by mouth every 6 (six) hours as needed for pain.      CONTINUE taking these medications         docusate sodium 100 MG capsule   Commonly known as: COLACE   Take 1 capsule (100 mg total) by mouth 2 (two) times daily as needed for constipation.      ibuprofen 200 MG tablet   Commonly known as: ADVIL,MOTRIN   Take 3 tablets (600 mg total) by mouth every 6 (six) hours as needed. For pain          Where to get your medications    These are the prescriptions that you need to pick up.   You may get these medications from any pharmacy.         traMADol 50 MG tablet           Follow up this week in clinic for post op appt   Annabell Oconnor 08/04/2012, 6:34 PM

## 2012-08-07 NOTE — MAU Provider Note (Signed)
Attestation of Attending Supervision of Advanced Practitioner (CNM/NP): Evaluation and management procedures were performed by the Advanced Practitioner under my supervision and collaboration.  I have reviewed the Advanced Practitioner's note and chart, and I agree with the management and plan.  HARRAWAY-SMITH, Seanne Chirico 7:43 PM     

## 2012-08-09 ENCOUNTER — Encounter: Payer: Self-pay | Admitting: Obstetrics and Gynecology

## 2012-08-15 ENCOUNTER — Encounter: Payer: Self-pay | Admitting: Obstetrics and Gynecology

## 2012-08-15 ENCOUNTER — Ambulatory Visit (INDEPENDENT_AMBULATORY_CARE_PROVIDER_SITE_OTHER): Payer: Medicaid Other | Admitting: Obstetrics and Gynecology

## 2012-08-15 ENCOUNTER — Encounter: Payer: Self-pay | Admitting: *Deleted

## 2012-08-15 VITALS — BP 112/69 | HR 83 | Temp 97.3°F | Resp 12 | Ht 64.0 in | Wt 177.8 lb

## 2012-08-15 DIAGNOSIS — Z09 Encounter for follow-up examination after completed treatment for conditions other than malignant neoplasm: Secondary | ICD-10-CM

## 2012-08-15 NOTE — Progress Notes (Signed)
Patient ID: Amber Ray, female   DOB: 01/28/1991, 21 y.o.   MRN: [id number] 21 yo G1P0010 s/p laparoscopic cystectomy with removal of a dermoid ovarian cyst on 07/27/2012 presenting today for postoperative check. Patient reports feeling much better today but states a visit to [facility] ED secondary to drainage from RLQ incision and a second visit to MAU for generalized abdominal pain. These issues have since resolved and the patient is currently without any complaints.  GENERAL: Well-developed, well-nourished female in no acute distress.  LUNGS: Clear to auscultation bilaterally.  HEART: Regular rate and rhythm. ABDOMEN: Soft, nontender, nondistended. No organomegaly. Incision x 3 healed completely and without any signs of infection. PELVIC: Normal external female genitalia. Vagina is pink and rugated.  Normal discharge. Normal appearing cervix. Uterus is normal in size.  No adnexal mass or tenderness. EXTREMITIES: No cyanosis, clubbing, or edema, 2+ distal pulses.   A/P 21 yo s/p laparoscopic removal of a dermoid ovarian cyst - Results of pathology reviewed with patient - Patient medically cleared to resume all activities of daily living - RTC prn or for annual exam

## 2012-11-28 ENCOUNTER — Encounter (HOSPITAL_COMMUNITY): Payer: Self-pay | Admitting: Emergency Medicine

## 2012-11-28 ENCOUNTER — Emergency Department (HOSPITAL_COMMUNITY): Payer: Self-pay

## 2012-11-28 ENCOUNTER — Emergency Department (HOSPITAL_COMMUNITY)
Admission: EM | Admit: 2012-11-28 | Discharge: 2012-11-28 | Disposition: A | Discharge status: Home / Self Care | Payer: Self-pay | Attending: Emergency Medicine | Admitting: Emergency Medicine

## 2012-11-28 DIAGNOSIS — B9789 Other viral agents as the cause of diseases classified elsewhere: Secondary | ICD-10-CM | POA: Insufficient documentation

## 2012-11-28 DIAGNOSIS — Z8619 Personal history of other infectious and parasitic diseases: Secondary | ICD-10-CM | POA: Insufficient documentation

## 2012-11-28 DIAGNOSIS — R509 Fever, unspecified: Secondary | ICD-10-CM | POA: Insufficient documentation

## 2012-11-28 DIAGNOSIS — Z8659 Personal history of other mental and behavioral disorders: Secondary | ICD-10-CM | POA: Insufficient documentation

## 2012-11-28 DIAGNOSIS — B349 Viral infection, unspecified: Secondary | ICD-10-CM

## 2012-11-28 DIAGNOSIS — IMO0001 Reserved for inherently not codable concepts without codable children: Secondary | ICD-10-CM | POA: Insufficient documentation

## 2012-11-28 DIAGNOSIS — J3489 Other specified disorders of nose and nasal sinuses: Secondary | ICD-10-CM | POA: Insufficient documentation

## 2012-11-28 DIAGNOSIS — R05 Cough: Secondary | ICD-10-CM | POA: Insufficient documentation

## 2012-11-28 DIAGNOSIS — F17200 Nicotine dependence, unspecified, uncomplicated: Secondary | ICD-10-CM | POA: Insufficient documentation

## 2012-11-28 DIAGNOSIS — R059 Cough, unspecified: Secondary | ICD-10-CM | POA: Insufficient documentation

## 2012-11-28 DIAGNOSIS — F319 Bipolar disorder, unspecified: Secondary | ICD-10-CM | POA: Insufficient documentation

## 2012-11-28 DIAGNOSIS — Z8742 Personal history of other diseases of the female genital tract: Secondary | ICD-10-CM | POA: Insufficient documentation

## 2012-11-28 MED ORDER — GUAIFENESIN-CODEINE 100-10 MG/5ML PO SYRP: 10.0000 mL | ORAL_SOLUTION | Freq: Three times a day (TID) | ORAL | Status: AC | PRN

## 2012-11-28 MED ORDER — IBUPROFEN 800 MG PO TABS: 800.0000 mg | ORAL_TABLET | Freq: Three times a day (TID) | ORAL | Status: DC

## 2012-11-28 MED ORDER — ACETAMINOPHEN 325 MG PO TABS
650.0000 mg | ORAL_TABLET | Freq: Once | ORAL | Status: AC
  Administered 2012-11-28: 650 mg via ORAL
  Filled 2012-11-28: qty 2

## 2012-11-28 NOTE — ED Notes (Signed)
Cough/congestion,fever and body aches since Saturday.

## 2012-11-28 NOTE — ED Notes (Signed)
Flu like symptoms x 2 days per pt

## 2012-11-28 NOTE — ED Notes (Signed)
Pt states she took motrin this am, but hasn't taken tylenol.

## 2012-12-01 NOTE — ED Provider Notes (Signed)
History     CSN: [id number]  Arrival date & time 11/28/12  1033   First MD Initiated Contact with Patient 11/28/12 1122      Chief Complaint  Patient presents with  . Influenza    (Consider location/radiation/quality/duration/timing/severity/associated sxs/prior treatment) HPI Comments: Patient reports sudden onset of cough, fever, congestion , body aches, and runny nose for 2-3 days.  States she has not tried any OTC medications.  She states the cough is productive of white colored sputum.  She denies dyspnea, chest pain, abd pain, dysuria or vomiting.   Patient is a 22 y.o. female presenting with flu symptoms. The history is provided by the patient.  Influenza This is a new problem. The current episode started in the past 7 days. The problem occurs constantly. The problem has been unchanged. Associated symptoms include chills, congestion, coughing, a fever and myalgias. Pertinent negatives include no abdominal pain, arthralgias, change in bowel habit, chest pain, diaphoresis, headaches, joint swelling, nausea, neck pain, numbness, rash, sore throat, urinary symptoms, visual change, vomiting or weakness. Nothing aggravates the symptoms. She has tried nothing for the symptoms. The treatment provided no relief.    Past Medical History  Diagnosis Date  . Anxiety   . Chlamydia   . Gonorrhea   . Depression   . Bipolar affective disorder, depressed, mild   . 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 Dermoid cyst     Past Surgical History  Procedure Date  . No past surgeries   . Laparoscopy 07/27/2012    Procedure: LAPAROSCOPY OPERATIVE;  Surgeon: Catalina Antigua, MD;  Location: [facility];  Service: Gynecology;  Laterality: N/A;  . Ovarian cyst removal 07/27/2012    Procedure: OVARIAN CYSTECTOMY;  Surgeon: Catalina Antigua, MD;  Location: [facility];  Service: Gynecology;  Laterality: Right;  Dermoid Cyst    Family History  Problem Relation Age of Onset  . Diabetes Maternal  Grandmother   . Arthritis Maternal Grandmother   . Arthritis Maternal Grandfather   . Diabetes Paternal Grandmother   . Hypertension Paternal Grandmother   . Arthritis Paternal Grandmother   . Kidney disease Paternal Grandmother   . Arthritis Paternal Grandfather     History  Substance Use Topics  . Smoking status: Current Every Day Smoker -- 0.5 packs/day for 2 years  . Smokeless tobacco: Never Used  . Alcohol Use: 0.5 oz/week    1 drink(s) per week     Comment: occasionally    OB History    Grav Para Term Preterm Abortions TAB SAB Ect Mult Living   1    1  1    0      Review of Systems  Constitutional: Positive for fever and chills. Negative for diaphoresis, activity change and appetite change.  HENT: Positive for congestion, rhinorrhea and sneezing. Negative for sore throat, facial swelling, trouble swallowing, neck pain and neck stiffness.   Eyes: Negative for visual disturbance.  Respiratory: Positive for cough. Negative for chest tightness, shortness of breath, wheezing and stridor.   Cardiovascular: Negative for chest pain.  Gastrointestinal: Negative for nausea, vomiting, abdominal pain and change in bowel habit.  Genitourinary: Negative for dysuria and enuresis.  Musculoskeletal: Positive for myalgias. Negative for joint swelling and arthralgias.  Skin: Negative.  Negative for rash.  Neurological: Negative for dizziness, weakness, numbness and headaches.  Hematological: Negative for adenopathy.  Psychiatric/Behavioral: Negative for confusion.  All other systems reviewed and are negative.    Allergies  Review of patient's allergies indicates no known allergies.  Home Medications   Current Outpatient Rx  Name  Route  Sig  Dispense  Refill  . GUAIFENESIN-CODEINE 100-10 MG/5ML PO SYRP   Oral   Take 10 mLs by mouth 3 (three) times daily as needed for cough.   120 mL   0   . IBUPROFEN 800 MG PO TABS   Oral   Take 1 tablet (800 mg total) by mouth 3 (three)  times daily.   21 tablet   0     BP 123/64  Pulse 114  Temp 99 F (37.2 C) (Axillary)  Resp 18  Ht 5\' 5"  (1.651 m)  Wt 185 lb (83.915 kg)  BMI 30.79 kg/m2  SpO2 95%  LMP 11/15/2012  Physical Exam  Nursing note and vitals reviewed. Constitutional: She is oriented to person, place, and time. She appears well-developed and well-nourished. No distress.  HENT:  Head: Normocephalic and atraumatic. No trismus in the jaw.  Right Ear: Tympanic membrane and ear canal normal.  Left Ear: Tympanic membrane and ear canal normal.  Nose: Mucosal edema and rhinorrhea present.  Mouth/Throat: Uvula is midline and mucous membranes are normal. No uvula swelling. No oropharyngeal exudate, posterior oropharyngeal edema, posterior oropharyngeal erythema or tonsillar abscesses.  Neck: Normal range of motion and phonation normal. Neck supple. No Brudzinski's sign and no Kernig's sign noted.  Cardiovascular: Normal rate, regular rhythm, normal heart sounds and intact distal pulses.   No murmur heard. Pulmonary/Chest: Effort normal and breath sounds normal. She has no wheezes. She has no rales.  Abdominal: Soft. She exhibits no distension. There is no tenderness.  Musculoskeletal: She exhibits no edema and no tenderness.  Lymphadenopathy:    She has no cervical adenopathy.  Neurological: She is alert and oriented to person, place, and time. She exhibits normal muscle tone. Coordination normal.  Skin: Skin is warm and dry.    ED Course  Procedures (including critical care time)  Labs Reviewed - No data to display No results found.   1. Viral illness       MDM    Patient with sudden onset of flu like sx's.  She is otherwise well appearing.  Vitals stable.  Pt offered tamiflu but declines due to cost.  Will provide symptomatic treatment.  She agrees to return here if needed.  prescribed Robitussin AC Ibuprofen 800 mg  Iran Rowe L. Elnor[place], [place] 12/01/12 1335

## 2012-12-05 NOTE — ED Provider Notes (Signed)
Medical screening examination/treatment/procedure(s) were performed by non-physician practitioner and as supervising physician I was immediately available for consultation/collaboration.   Layaan Mott W. Caroline Matters, MD 12/05/12 1109 

## 2012-12-19 ENCOUNTER — Emergency Department (HOSPITAL_COMMUNITY)
Admission: EM | Admit: 2012-12-19 | Discharge: 2012-12-19 | Disposition: A | Discharge status: Home / Self Care | Payer: Self-pay | Attending: Emergency Medicine | Admitting: Emergency Medicine

## 2012-12-19 ENCOUNTER — Encounter (HOSPITAL_COMMUNITY): Payer: Self-pay | Admitting: *Deleted

## 2012-12-19 DIAGNOSIS — N939 Abnormal uterine and vaginal bleeding, unspecified: Secondary | ICD-10-CM | POA: Insufficient documentation

## 2012-12-19 DIAGNOSIS — N760 Acute vaginitis: Secondary | ICD-10-CM | POA: Insufficient documentation

## 2012-12-19 DIAGNOSIS — N949 Unspecified condition associated with female genital organs and menstrual cycle: Secondary | ICD-10-CM | POA: Insufficient documentation

## 2012-12-19 DIAGNOSIS — Z3202 Encounter for pregnancy test, result negative: Secondary | ICD-10-CM | POA: Insufficient documentation

## 2012-12-19 DIAGNOSIS — M549 Dorsalgia, unspecified: Secondary | ICD-10-CM

## 2012-12-19 DIAGNOSIS — Z8659 Personal history of other mental and behavioral disorders: Secondary | ICD-10-CM | POA: Insufficient documentation

## 2012-12-19 DIAGNOSIS — Z8742 Personal history of other diseases of the female genital tract: Secondary | ICD-10-CM | POA: Insufficient documentation

## 2012-12-19 DIAGNOSIS — N926 Irregular menstruation, unspecified: Secondary | ICD-10-CM | POA: Insufficient documentation

## 2012-12-19 DIAGNOSIS — F17200 Nicotine dependence, unspecified, uncomplicated: Secondary | ICD-10-CM | POA: Insufficient documentation

## 2012-12-19 DIAGNOSIS — R109 Unspecified abdominal pain: Secondary | ICD-10-CM | POA: Insufficient documentation

## 2012-12-19 DIAGNOSIS — Z8619 Personal history of other infectious and parasitic diseases: Secondary | ICD-10-CM | POA: Insufficient documentation

## 2012-12-19 DIAGNOSIS — M545 Low back pain, unspecified: Secondary | ICD-10-CM | POA: Insufficient documentation

## 2012-12-19 LAB — WET PREP, GENITAL: Yeast Wet Prep HPF POC: NONE SEEN

## 2012-12-19 LAB — URINALYSIS, ROUTINE W REFLEX MICROSCOPIC
Bilirubin Urine: NEGATIVE
Glucose, UA: NEGATIVE mg/dL
Ketones, ur: NEGATIVE mg/dL
Nitrite: NEGATIVE
Protein, ur: NEGATIVE mg/dL
pH: 8 (ref 5.0–8.0)

## 2012-12-19 MED ORDER — METRONIDAZOLE 500 MG PO TABS: 500.0000 mg | ORAL_TABLET | Freq: Two times a day (BID) | ORAL | Status: DC

## 2012-12-19 MED ORDER — HYDROCODONE-ACETAMINOPHEN 5-325 MG PO TABS: 2.0000 | ORAL_TABLET | ORAL | Status: DC | PRN

## 2012-12-19 NOTE — ED Provider Notes (Signed)
History     CSN: [id number]  Arrival date & time 12/19/12  1626   First MD Initiated Contact with Patient 12/19/12 1647      Chief Complaint  Patient presents with  . Back Pain    (Consider location/radiation/quality/duration/timing/severity/associated sxs/prior treatment) HPI Comments: Patient comes to the ER for evaluation of low back pain and lower abdominal cramping. Patient reports that she is 8 days overdue for her menstrual period. She says that she had slight spotting on the day she was expecting her period, but it stopped.  Patient reports that her back has been hurting for approximately 2 weeks. She does not recall any injury. Pain is cramping in nature. She says that appropriately she is experiencing and her back is similar to when she is about to have menstrual cramps. There is no radiation of the pain. She has not had any urinary symptoms.  Patient is a 22 y.o. female presenting with back pain.  Back Pain Associated symptoms: pelvic pain     Past Medical History  Diagnosis Date  . Anxiety   . Chlamydia   . Gonorrhea   . Depression   . Bipolar affective disorder, depressed, mild   . 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 Dermoid cyst     Past Surgical History  Procedure Laterality Date  . No past surgeries    . Laparoscopy  07/27/2012    Procedure: LAPAROSCOPY OPERATIVE;  Surgeon: Catalina Antigua, MD;  Location: [facility];  Service: Gynecology;  Laterality: N/A;  . Ovarian cyst removal  07/27/2012    Procedure: OVARIAN CYSTECTOMY;  Surgeon: Catalina Antigua, MD;  Location: [facility];  Service: Gynecology;  Laterality: Right;  Dermoid Cyst    Family History  Problem Relation Age of Onset  . Diabetes Maternal Grandmother   . Arthritis Maternal Grandmother   . Arthritis Maternal Grandfather   . Diabetes Paternal Grandmother   . Hypertension Paternal Grandmother   . Arthritis Paternal Grandmother   . Kidney disease Paternal Grandmother   .  Arthritis Paternal Grandfather     History  Substance Use Topics  . Smoking status: Current Every Day Smoker -- 0.50 packs/day for 2 years  . Smokeless tobacco: Never Used  . Alcohol Use: 0.5 oz/week    1 drink(s) per week     Comment: occasionally    OB History   Grav Para Term Preterm Abortions TAB SAB Ect Mult Living   1    1  1    0      Review of Systems  Genitourinary: Positive for pelvic pain.  Musculoskeletal: Positive for back pain.  All other systems reviewed and are negative.    Allergies  Review of patient's allergies indicates no known allergies.  Home Medications   Current Outpatient Rx  Name  Route  Sig  Dispense  Refill  . ibuprofen (ADVIL,MOTRIN) 800 MG tablet   Oral   Take 1 tablet (800 mg total) by mouth 3 (three) times daily.   21 tablet   0     BP 122/85  Pulse 108  Temp(Src) 98.5 F (36.9 C) (Oral)  Ht 5\' 4"  (1.626 m)  Wt 180 lb (81.647 kg)  BMI 30.88 kg/m2  SpO2 100%  LMP 11/11/2012  Physical Exam  Constitutional: She is oriented to person, place, and time. She appears well-developed and well-nourished. No distress.  HENT:  Head: Normocephalic and atraumatic.  Right Ear: Hearing normal.  Nose: Nose normal.  Mouth/Throat: Oropharynx is clear and moist and mucous membranes are normal.  Eyes: Conjunctivae and EOM are normal. Pupils are equal, round, and reactive to light.  Neck: Normal range of motion. Neck supple.  Cardiovascular: Normal rate, regular rhythm, S1 normal and S2 normal.  Exam reveals no gallop and no friction rub.   No murmur heard. Pulmonary/Chest: Effort normal and breath sounds normal. No respiratory distress. She exhibits no tenderness.  Abdominal: Soft. Normal appearance and bowel sounds are normal. There is no hepatosplenomegaly. There is no tenderness. There is no rebound, no guarding, no tenderness at McBurney's point and negative Murphy's sign. No hernia.  Musculoskeletal: Normal range of motion.  Neurological:  She is alert and oriented to person, place, and time. She has normal strength. No cranial nerve deficit or sensory deficit. Coordination normal. GCS eye subscore is 4. GCS verbal subscore is 5. GCS motor subscore is 6.  Skin: Skin is warm, dry and intact. No rash noted. No cyanosis.  Psychiatric: She has a normal mood and affect. Her speech is normal and behavior is normal. Thought content normal.    ED Course  Procedures (including critical care time)  Labs Reviewed  GC/CHLAMYDIA PROBE AMP  WET PREP, GENITAL  URINALYSIS, ROUTINE W REFLEX MICROSCOPIC  PREGNANCY, URINE   No results found.   Diagnosis: 1. Back pain 2. Menstrual irregularity 3. Vaginitis    MDM  Patient comes to the ER for evaluation of back pain. She has been having low back pain for 2 weeks. There was no injury. Pain is not radiating she has a normal neurologic exam. Patient now complaining of having missed her period. She had slight spotting when she expected the period, but never developed for menstrual bleeding. She has had some mild discomfort in the low pelvic area intermittently, examination, however was unremarkable. Patient will be referred to OB/GYN for followup. Urinalysis was unremarkable. Urine pregnancy was negative. Treat for musculoskeletal back pain.        Gilda Crease, MD 12/20/12 2238

## 2012-12-19 NOTE — ED Notes (Signed)
Low back pain,  Low abd cramping, period is 8 days late. Neg pregnancy test at home.  Nausea, vomiting.

## 2012-12-20 LAB — GC/CHLAMYDIA PROBE AMP: GC Probe RNA: NEGATIVE

## 2012-12-24 ENCOUNTER — Other Ambulatory Visit: Payer: Self-pay

## 2013-01-23 ENCOUNTER — Ambulatory Visit: Payer: Self-pay | Admitting: Obstetrics and Gynecology

## 2013-02-08 ENCOUNTER — Encounter: Payer: Self-pay | Admitting: Medical

## 2013-02-08 ENCOUNTER — Ambulatory Visit (INDEPENDENT_AMBULATORY_CARE_PROVIDER_SITE_OTHER): Payer: Self-pay | Admitting: Medical

## 2013-02-08 VITALS — BP 108/74 | HR 91 | Temp 98.2°F | Ht 64.0 in | Wt 183.7 lb

## 2013-02-08 DIAGNOSIS — N926 Irregular menstruation, unspecified: Secondary | ICD-10-CM | POA: Insufficient documentation

## 2013-02-08 DIAGNOSIS — N949 Unspecified condition associated with female genital organs and menstrual cycle: Secondary | ICD-10-CM

## 2013-02-08 DIAGNOSIS — R102 Pelvic and perineal pain: Secondary | ICD-10-CM

## 2013-02-08 DIAGNOSIS — N930 Postcoital and contact bleeding: Secondary | ICD-10-CM

## 2013-02-08 DIAGNOSIS — Z8742 Personal history of other diseases of the female genital tract: Secondary | ICD-10-CM

## 2013-02-08 DIAGNOSIS — Z87898 Personal history of other specified conditions: Secondary | ICD-10-CM

## 2013-02-08 LAB — HEMOGLOBIN A1C
Hgb A1c MFr Bld: 5.9 % — ABNORMAL HIGH (ref <5.7)
Mean Plasma Glucose: 123 mg/dL — ABNORMAL HIGH (ref <117)

## 2013-02-08 LAB — POCT PREGNANCY, URINE: Preg Test, Ur: NEGATIVE

## 2013-02-08 NOTE — Progress Notes (Signed)
Patient ID: Amber Ray, female   DOB: Mar 20, 1991, 22 y.o.   MRN: [id number]  History:  Ms. Amber Ray is a 22 y.o. G1P0010 who presents to clinic today for irregular menses and pelvic pain. The patient states that she had a laparoscopic cystectomy in 07/2012 and had regular periods monthly since then. She does have a history of irregular periods at a younger age and was tried on birth control pills. She states that she never felt "well" on the pills and that they did not help regulate her periods. She is here today because she has not had a period since 11/11/12.    The following portions of the patient's history were reviewed and updated as appropriate: allergies, current medications, past family history, past medical history, past social history, past surgical history and problem list.  Review of Systems:  Pertinent items are noted in HPI.  Objective:  Physical Exam BP 108/74  Pulse 91  Temp(Src) 98.2 F (36.8 C)  Ht 5\' 4"  (1.626 m)  Wt 183 lb 11.2 oz (83.326 kg)  BMI 31.52 kg/m2  LMP 11/10/2012 GENERAL: Well-developed, well-nourished female in no acute distress.  HEENT: Normocephalic, atraumatic. Hirsutism.  NECK: Supple. Normal thyroid.  LUNGS: Normal rate. Clear to auscultation bilaterally.  HEART: Regular rate and rhythm with no adventitious sounds.  ABDOMEN: Soft, mild tenderness to palpation of the lower abdomen, nondistended. No organomegaly. Normal bowel sounds appreciated in all quadrants.  PELVIC: Normal external female genitalia. Vagina is pink and rugated.  Normal discharge. Normal cervix contour. Pap smear obtained. Uterus is normal in size. No adnexal mass. Mild tenderness to palpation of the suprapubic region at midline and just left of midline.  EXTREMITIES: No cyanosis, clubbing, or edema.  Labs and Imaging FSH, TSH, Hgb A1c, Free testosterone drawn today Pap today  Assessment & Plan:  Assessment: Irregular menses, most likely PCOS related Pelvic  pain History of abnormal pap smear  Plans: Labs today as noted above [place] scheduled to be performed prior to next visit Patient will return in about 2 weeks for results and management  Freddi Starr, PA-C 02/08/2013 4:09 PM

## 2013-02-08 NOTE — Patient Instructions (Signed)
Ovarian Cyst The ovaries are small organs that are on each side of the uterus. The ovaries are the organs that produce the female hormones, estrogen and progesterone. An ovarian cyst is a sac filled with fluid that can vary in its size. It is normal for a small cyst to form in women who are in the childbearing age and who have menstrual periods. This type of cyst is called a follicle cyst that becomes an ovulation cyst (corpus luteum cyst) after it produces the women's egg. It later goes away on its own if the woman does not become pregnant. There are other kinds of ovarian cysts that may cause problems and may need to be treated. The most serious problem is a cyst with cancer. It should be noted that menopausal women who have an ovarian cyst are at a higher risk of it being a cancer cyst. They should be evaluated very quickly, thoroughly and followed closely. This is especially true in menopausal women because of the high rate of ovarian cancer in women in menopause. CAUSES AND TYPES OF OVARIAN CYSTS:  FUNCTIONAL CYST: The follicle/corpus luteum cyst is a functional cyst that occurs every month during ovulation with the menstrual cycle. They go away with the next menstrual cycle if the woman does not get pregnant. Usually, there are no symptoms with a functional cyst.  ENDOMETRIOMA CYST: This cyst develops from the lining of the uterus tissue. This cyst gets in or on the ovary. It grows every month from the bleeding during the menstrual period. It is also called a "chocolate cyst" because it becomes filled with blood that turns brown. This cyst can cause pain in the lower abdomen during intercourse and with your menstrual period.  CYSTADENOMA CYST: This cyst develops from the cells on the outside of the ovary. They usually are not cancerous. They can get very big and cause lower abdomen pain and pain with intercourse. This type of cyst can twist on itself, cut off its blood supply and cause severe pain. It  also can easily rupture and cause a lot of pain.  DERMOID CYST: This type of cyst is sometimes found in both ovaries. They are found to have different kinds of body tissue in the cyst. The tissue includes skin, teeth, hair, and/or cartilage. They usually do not have symptoms unless they get very big. Dermoid cysts are rarely cancerous.  POLYCYSTIC OVARY: This is a rare condition with hormone problems that produces many small cysts on both ovaries. The cysts are follicle-like cysts that never produce an egg and become a corpus luteum. It can cause an increase in body weight, infertility, acne, increase in body and facial hair and lack of menstrual periods or rare menstrual periods. Many women with this problem develop type 2 diabetes. The exact cause of this problem is unknown. A polycystic ovary is rarely cancerous.  THECA LUTEIN CYST: Occurs when too much hormone (human chorionic gonadotropin) is produced and over-stimulates the ovaries to produce an egg. They are frequently seen when doctors stimulate the ovaries for invitro-fertilization (test tube babies).  LUTEOMA CYST: This cyst is seen during pregnancy. Rarely it can cause an obstruction to the birth canal during labor and delivery. They usually go away after delivery. SYMPTOMS   Pelvic pain or pressure.  Pain during sexual intercourse.  Increasing girth (swelling) of the abdomen.  Abnormal menstrual periods.  Increasing pain with menstrual periods.  You stop having menstrual periods and you are not pregnant. DIAGNOSIS  The diagnosis can   be made during:  Routine or annual pelvic examination (common).  Ultrasound.  X-ray of the pelvis.  CT Scan.  MRI.  Blood tests. TREATMENT   Treatment may only be to follow the cyst monthly for 2 to 3 months with your caregiver. Many go away on their own, especially functional cysts.  May be aspirated (drained) with a long needle with ultrasound, or by laparoscopy (inserting a tube into  the pelvis through a small incision).  The whole cyst can be removed by laparoscopy.  Sometimes the cyst may need to be removed through an incision in the lower abdomen.  Hormone treatment is sometimes used to help dissolve certain cysts.  Birth control pills are sometimes used to help dissolve certain cysts. HOME CARE INSTRUCTIONS  Follow your caregiver's advice regarding:  Medicine.  Follow up visits to evaluate and treat the cyst.  You may need to come back or make an appointment with another caregiver, to find the exact cause of your cyst, if your caregiver is not a gynecologist.  Get your yearly and recommended pelvic examinations and Pap tests.  Let your caregiver know if you have had an ovarian cyst in the past. SEEK MEDICAL CARE IF:   Your periods are late, irregular, they stop, or are painful.  Your stomach (abdomen) or pelvic pain does not go away.  Your stomach becomes larger or swollen.  You have pressure on your bladder or trouble emptying your bladder completely.  You have painful sexual intercourse.  You have feelings of fullness, pressure, or discomfort in your stomach.  You lose weight for no apparent reason.  You feel generally ill.  You become constipated.  You lose your appetite.  You develop acne.  You have an increase in body and facial hair.  You are gaining weight, without changing your exercise and eating habits.  You think you are pregnant. SEEK IMMEDIATE MEDICAL CARE IF:   You have increasing abdominal pain.  You feel sick to your stomach (nausea) and/or vomit.  You develop a fever that comes on suddenly.  You develop abdominal pain during a bowel movement.  Your menstrual periods become heavier than usual. Document Released: 10/26/2005 Document Revised: 01/18/2012 Document Reviewed: 08/29/2009 [facility] Patient Information 2013 [place], [place]. Polycystic Ovarian Syndrome Polycystic ovarian syndrome is a condition with a  number of problems. One problem is with the ovaries. The ovaries are organs located in the female pelvis, on each side of the uterus. Usually, during the menstrual cycle, an egg is released from 1 ovary every month. This is called ovulation. When the egg is fertilized, it goes into the womb (uterus), which allows for the growth of a baby. The egg travels from the ovary through the fallopian tube to the uterus. The ovaries also make the hormones estrogen and progesterone. These hormones help the development of a woman's breasts, body shape, and body hair. They also regulate the menstrual cycle and pregnancy. Sometimes, cysts form in the ovaries. A cyst is a fluid-filled sac. On the ovary, different types of cysts can form. The most common type of ovarian cyst is called a functional or ovulation cyst. It is normal, and often forms during the normal menstrual cycle. Each month, a woman's ovaries grow tiny cysts that hold the eggs. When an egg is fully grown, the sac breaks open. This releases the egg. Then, the sac which released the egg from the ovary dissolves. In one type of functional cyst, called a follicle cyst, the sac does not break  open to release the egg. It may actually continue to grow. This type of cyst usually disappears within 1 to 3 months.  One type of cyst problem with the ovaries is called Polycystic Ovarian Syndrome (PCOS). In this condition, many follicle cysts form, but do not rupture and produce an egg. This health problem can affect the following:  Menstrual cycle.  Heart.  Obesity.  Cancer of the uterus.  Fertility.  Blood vessels.  Hair growth (face and body) or baldness.  Hormones.  Appearance.  High blood pressure.  Stroke.  Insulin production.  Inflammation of the liver.  Elevated blood cholesterol and triglycerides. CAUSES   No one knows the exact cause of PCOS.  Women with PCOS often have a mother or sister with PCOS. There is not yet enough proof to say  this is inherited.  Many women with PCOS have a weight problem.  Researchers are looking at the relationship between PCOS and the body's ability to make insulin. Insulin is a hormone that regulates the change of sugar, starches, and other food into energy for the body's use, or for storage. Some women with PCOS make too much insulin. It is possible that the ovaries react by making too many female hormones, called androgens. This can lead to acne, excessive hair growth, weight gain, and ovulation problems.  Too much production of luteinizing hormone (LH) from the pituitary gland in the brain stimulates the ovary to produce too much female hormone (androgen). SYMPTOMS   Infrequent or no menstrual periods, and/or irregular bleeding.  Inability to get pregnant (infertility), because of not ovulating.  Increased growth of hair on the face, chest, stomach, back, thumbs, thighs, or toes.  Acne, oily skin, or dandruff.  Pelvic pain.  Weight gain or obesity, usually carrying extra weight around the waist.  Type 2 diabetes (this is the diabetes that usually does not need insulin).  High cholesterol.  High blood pressure.  Female-pattern baldness or thinning hair.  Patches of thickened and dark brown or black skin on the neck, arms, breasts, or thighs.  Skin tags, or tiny excess flaps of skin, in the armpits or neck area.  Sleep apnea (excessive snoring and breathing stops at times while asleep).  Deepening of the voice.  Gestational diabetes when pregnant.  Increased risk of miscarriage with pregnancy. DIAGNOSIS  There is no single test to diagnose PCOS.   Your caregiver will:  Take a medical history.  Perform a pelvic exam.  Perform an ultrasound.  Check your female and female hormone levels.  Measure glucose or sugar levels in the blood.  Do other blood tests.  If you are producing too many female hormones, your caregiver will make sure it is from PCOS. At the physical exam,  your caregiver will want to evaluate the areas of increased hair growth. Try to allow natural hair growth for a few days before the visit.  During a pelvic exam, the ovaries may be enlarged or swollen by the increased number of small cysts. This can be seen more easily by vaginal ultrasound or screening, to examine the ovaries and lining of the uterus (endometrium) for cysts. The uterine lining may become thicker, if there has not been a regular period. TREATMENT  Because there is no cure for PCOS, it needs to be managed to prevent problems. Treatments are based on your symptoms. Treatment is also based on whether you want to have a baby or whether you need contraception.  Treatment may include:  Progesterone hormone, to  start a menstrual period.  Birth control pills, to make you have regular menstrual periods.  Medicines to make you ovulate, if you want to get pregnant.  Medicines to control your insulin.  Medicine to control your blood pressure.  Medicine and diet, to control your high cholesterol and triglycerides in your blood.  Surgery, making small holes in the ovary, to decrease the amount of female hormone production. This is done through a long, lighted tube (laparoscope), placed into the pelvis through a tiny incision in the lower abdomen. Your caregiver will go over some of the choices with you. WOMEN WITH PCOS HAVE THESE CHARACTERISTICS:  High levels of female hormones called androgens.  An irregular or no menstrual cycle.  May have many small cysts in their ovaries. PCOS is the most common hormonal reproductive problem in women of childbearing age. WHY DO WOMEN WITH PCOS HAVE TROUBLE WITH THEIR MENSTRUAL CYCLE? Each month, about 20 eggs start to mature in the ovaries. As one egg grows and matures, the follicle breaks open to release the egg, so it can travel through the fallopian tube for fertilization. When the single egg leaves the follicle, ovulation takes place. In women  with PCOS, the ovary does not make all of the hormones it needs for any of the eggs to fully mature. They may start to grow and accumulate fluid, but no one egg becomes large enough. Instead, some may remain as cysts. Since no egg matures or is released, ovulation does not occur and the hormone progesterone is not made. Without progesterone, a woman's menstrual cycle is irregular or absent. Also, the cysts produce female hormones, which continue to prevent ovulation.  Document Released: 02/19/2005 Document Revised: 01/18/2012 Document Reviewed: 09/13/2009 [facility] Patient Information 2013 [place], [place].

## 2013-02-09 LAB — TESTOSTERONE, FREE, TOTAL, SHBG: Testosterone, Free: 14.8 pg/mL — ABNORMAL HIGH (ref 0.6–6.8)

## 2013-02-21 ENCOUNTER — Telehealth: Payer: Self-pay | Admitting: *Deleted

## 2013-02-21 NOTE — Telephone Encounter (Signed)
Message copied by Jill Side on Tue Feb 21, 2013  8:31 AM ------      Message from: Freddi Starr      Created: Mon Feb 20, 2013  6:13 PM       Patient was supposed to have [place] prior to follow-up. Doesn't look like appointment was ever scheduled. Can we attempt to contact the patient and get her set up for [place] before visit scheduled on 03/02/13.             Thanks,             Raynelle Fanning ------

## 2013-02-21 NOTE — Telephone Encounter (Signed)
Called pt and informed her that Joseph Berkshire PA is requesting her to have [place] prior to her return clinic appt on 4/24.  Pt agreed and appt was scheduled for 4/21 @ 1545.  Pt voiced understanding.

## 2013-02-24 ENCOUNTER — Ambulatory Visit: Payer: Self-pay | Admitting: Medical

## 2013-02-27 ENCOUNTER — Ambulatory Visit (HOSPITAL_COMMUNITY): Payer: Self-pay | Attending: Medical

## 2013-03-02 ENCOUNTER — Ambulatory Visit: Payer: Self-pay | Admitting: Medical

## 2013-03-17 ENCOUNTER — Ambulatory Visit (HOSPITAL_COMMUNITY)
Admission: RE | Admit: 2013-03-17 | Discharge: 2013-03-17 | Disposition: A | Discharge status: Home / Self Care | Payer: BC Managed Care – PPO | Source: Ambulatory Visit | Attending: Medical | Admitting: Medical

## 2013-03-17 DIAGNOSIS — N949 Unspecified condition associated with female genital organs and menstrual cycle: Secondary | ICD-10-CM | POA: Insufficient documentation

## 2013-03-17 DIAGNOSIS — R102 Pelvic and perineal pain: Secondary | ICD-10-CM

## 2013-04-10 ENCOUNTER — Inpatient Hospital Stay (HOSPITAL_COMMUNITY)
Admission: AD | Admit: 2013-04-10 | Discharge: 2013-04-10 | Disposition: A | Discharge status: Home / Self Care | Payer: BC Managed Care – PPO | Source: Ambulatory Visit | Attending: Obstetrics & Gynecology | Admitting: Obstetrics & Gynecology

## 2013-04-10 ENCOUNTER — Encounter (HOSPITAL_COMMUNITY): Payer: Self-pay | Admitting: *Deleted

## 2013-04-10 ENCOUNTER — Ambulatory Visit: Payer: BC Managed Care – PPO | Admitting: Obstetrics and Gynecology

## 2013-04-10 ENCOUNTER — Inpatient Hospital Stay (HOSPITAL_COMMUNITY): Payer: BC Managed Care – PPO

## 2013-04-10 DIAGNOSIS — A5901 Trichomonal vulvovaginitis: Secondary | ICD-10-CM | POA: Insufficient documentation

## 2013-04-10 DIAGNOSIS — N730 Acute parametritis and pelvic cellulitis: Secondary | ICD-10-CM

## 2013-04-10 DIAGNOSIS — N949 Unspecified condition associated with female genital organs and menstrual cycle: Secondary | ICD-10-CM | POA: Insufficient documentation

## 2013-04-10 DIAGNOSIS — R109 Unspecified abdominal pain: Secondary | ICD-10-CM | POA: Insufficient documentation

## 2013-04-10 LAB — URINALYSIS, ROUTINE W REFLEX MICROSCOPIC
Leukocytes, UA: NEGATIVE
Nitrite: NEGATIVE
Specific Gravity, Urine: <1.005 — ABNORMAL LOW (ref 1.005–1.030)
Urobilinogen, UA: 0.2 mg/dL (ref 0.0–1.0)
pH: 6.5 (ref 5.0–8.0)

## 2013-04-10 LAB — CBC
Hemoglobin: 13.9 g/dL (ref 12.0–15.0)
Platelets: 225 10*3/uL (ref 150–400)
RBC: 4.85 MIL/uL (ref 3.87–5.11)
WBC: 12.2 10*3/uL — ABNORMAL HIGH (ref 4.0–10.5)

## 2013-04-10 LAB — POCT PREGNANCY, URINE: Preg Test, Ur: NEGATIVE

## 2013-04-10 LAB — WET PREP, GENITAL: Yeast Wet Prep HPF POC: NONE SEEN

## 2013-04-10 MED ORDER — HYDROMORPHONE HCL PF 1 MG/ML IJ SOLN
1.0000 mg | Freq: Once | INTRAMUSCULAR | Status: AC
  Administered 2013-04-10: 1 mg via INTRAMUSCULAR
  Filled 2013-04-10: qty 1

## 2013-04-10 MED ORDER — KETOROLAC TROMETHAMINE 60 MG/2ML IM SOLN
60.0000 mg | Freq: Once | INTRAMUSCULAR | Status: AC
Start: 2013-04-10 — End: 2013-04-10
  Administered 2013-04-10: 60 mg via INTRAMUSCULAR
  Filled 2013-04-10: qty 2

## 2013-04-10 MED ORDER — CEFTRIAXONE SODIUM 250 MG IJ SOLR
250.0000 mg | Freq: Once | INTRAMUSCULAR | Status: AC
  Administered 2013-04-10: 250 mg via INTRAMUSCULAR
  Filled 2013-04-10: qty 250

## 2013-04-10 MED ORDER — DIPHENHYDRAMINE HCL 25 MG PO CAPS
25.0000 mg | ORAL_CAPSULE | Freq: Once | ORAL | Status: AC
  Administered 2013-04-10: 25 mg via ORAL
  Filled 2013-04-10: qty 1

## 2013-04-10 MED ORDER — DOXYCYCLINE HYCLATE 50 MG PO CAPS: 100.0000 mg | ORAL_CAPSULE | Freq: Two times a day (BID) | ORAL | Status: DC

## 2013-04-10 MED ORDER — METRONIDAZOLE 500 MG PO TABS
2000.0000 mg | ORAL_TABLET | Freq: Once | ORAL | Status: AC
  Administered 2013-04-10: 2000 mg via ORAL
  Filled 2013-04-10: qty 4

## 2013-04-10 MED ORDER — OXYCODONE-ACETAMINOPHEN 5-325 MG PO TABS: 1.0000 | ORAL_TABLET | ORAL | Status: DC | PRN

## 2013-04-10 NOTE — [facility] Provider Note (Signed)
Attestation of Attending Supervision of Advanced Practitioner (CNM/NP): Evaluation and management procedures were performed by the Advanced Practitioner under my supervision and collaboration. I have reviewed the Advanced Practitioner's note and chart, and I agree with the management and plan.  Cortez Flippen H. 7:05 PM   

## 2013-04-10 NOTE — MAU Provider Note (Signed)
History     CSN: [id number]  Arrival date and time: 04/10/13 1133   None     Chief Complaint  Patient presents with  . Abdominal Pain   HPI 22 y.o. G1P0010 with low abd pain radiating to back since yesterday, suddenly worsened this morning, "feels like my uterus is being ripped out". H/O pelvic pain and ovarian cysts. Had f/u appointment in GYN clinic this afternoon. Last u/s on 03/17/13 was normal.   Past Medical History  Diagnosis Date  . Anxiety   . Chlamydia   . Gonorrhea   . Depression   . Bipolar affective disorder, depressed, mild   . 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 Dermoid cyst     Past Surgical History  Procedure Laterality Date  . No past surgeries    . Laparoscopy  07/27/2012    Procedure: LAPAROSCOPY OPERATIVE;  Surgeon: Catalina Antigua, MD;  Location: [facility];  Service: Gynecology;  Laterality: N/A;  . Ovarian cyst removal  07/27/2012    Procedure: OVARIAN CYSTECTOMY;  Surgeon: Catalina Antigua, MD;  Location: [facility];  Service: Gynecology;  Laterality: Right;  Dermoid Cyst    Family History  Problem Relation Age of Onset  . Diabetes Maternal Grandmother   . Arthritis Maternal Grandmother   . Arthritis Maternal Grandfather   . Diabetes Paternal Grandmother   . Hypertension Paternal Grandmother   . Arthritis Paternal Grandmother   . Kidney disease Paternal Grandmother   . Arthritis Paternal Grandfather     History  Substance Use Topics  . Smoking status: Current Every Day Smoker -- 0.50 packs/day for 2 years  . Smokeless tobacco: Never Used  . Alcohol Use: 0.5 oz/week    1 drink(s) per week     Comment: occasionally    Allergies: No Known Allergies  No prescriptions prior to admission    Review of Systems  Constitutional: Negative.  Negative for fever and chills.  Respiratory: Negative.   Cardiovascular: Negative.   Gastrointestinal: Positive for nausea and abdominal pain. Negative for vomiting, diarrhea and constipation.   Genitourinary: Negative for dysuria, urgency, frequency, hematuria and flank pain.       Positive for vaginal bleeding   Musculoskeletal: Positive for back pain.  Neurological: Negative.   Psychiatric/Behavioral: Negative.    Physical Exam   Blood pressure 128/70, pulse 68, temperature 97.8 F (36.6 C), temperature source Oral, resp. rate 18, last menstrual period 04/07/2013.  Physical Exam  Nursing note and vitals reviewed. Constitutional: She is oriented to person, place, and time. She appears well-developed and well-nourished. She appears distressed.  HENT:  Head: Normocephalic and atraumatic.  Cardiovascular: Normal rate.   Respiratory: Effort normal. No respiratory distress.  GI: Soft. She exhibits no distension and no mass. There is tenderness (diffuse low abd tenderness). There is guarding. There is no rebound.  Genitourinary: There is no rash or lesion on the right labia. There is no rash or lesion on the left labia. Uterus is tender. Uterus is not deviated, not enlarged and not fixed. Cervix exhibits motion tenderness. Cervix exhibits no discharge and no friability. Right adnexum displays tenderness. Right adnexum displays no mass and no fullness. Left adnexum displays tenderness. Left adnexum displays no mass and no fullness. There is bleeding (small) around the vagina. No erythema or tenderness around the vagina. No vaginal discharge found.  Neurological: She is alert and oriented to person, place, and time.  Skin: Skin is warm and dry.  Psychiatric: She has a normal mood and affect.    MAU Course  Procedures Results for orders placed during the hospital encounter of 04/10/13 (from the past 24 hour(s))  CBC     Status: Abnormal   Collection Time    04/10/13 11:50 AM      Result Value Range   WBC 12.2 (*) 4.0 - 10.5 K/uL   RBC 4.85  3.87 - 5.11 MIL/uL   Hemoglobin 13.9  12.0 - 15.0 g/dL   HCT 78.2  95.6 - 21.3 %   MCV 84.9  78.0 - 100.0 fL   MCH 28.7  26.0 - 34.0 pg    MCHC 33.7  30.0 - 36.0 g/dL   RDW 08.6  57.8 - 46.9 %   Platelets 225  150 - 400 K/uL  URINALYSIS, ROUTINE W REFLEX MICROSCOPIC     Status: Abnormal   Collection Time    04/10/13 11:58 AM      Result Value Range   Color, Urine YELLOW  YELLOW   APPearance CLEAR  CLEAR   Specific Gravity, Urine <1.005 (*) 1.005 - 1.030   pH 6.5  5.0 - 8.0   Glucose, UA NEGATIVE  NEGATIVE mg/dL   Hgb urine dipstick TRACE (*) NEGATIVE   Bilirubin Urine NEGATIVE  NEGATIVE   Ketones, ur NEGATIVE  NEGATIVE mg/dL   Protein, ur NEGATIVE  NEGATIVE mg/dL   Urobilinogen, UA 0.2  0.0 - 1.0 mg/dL   Nitrite NEGATIVE  NEGATIVE   Leukocytes, UA NEGATIVE  NEGATIVE  URINE MICROSCOPIC-ADD ON     Status: None   Collection Time    04/10/13 11:58 AM      Result Value Range   Squamous Epithelial / LPF RARE  RARE  POCT PREGNANCY, URINE     Status: None   Collection Time    04/10/13 12:44 PM      Result Value Range   Preg Test, Ur NEGATIVE  NEGATIVE  WET PREP, GENITAL     Status: Abnormal   Collection Time    04/10/13  1:28 PM      Result Value Range   Yeast Wet Prep HPF POC NONE SEEN  NONE SEEN   Trich, Wet Prep FEW (*) NONE SEEN   Clue Cells Wet Prep HPF POC FEW (*) NONE SEEN   WBC, Wet Prep HPF POC FEW (*) NONE SEEN   US Transvaginal Non-ob  04/10/2013   *RADIOLOGY REPORT*  Clinical Data: Severe pelvic pain.  Elevated white count of 12.2. LMP 04/07/2013.  TRANSVAGINAL ULTRASOUND OF PELVIS  Technique:  Transvaginal ultrasound examination of the pelvis was performed including evaluation of the uterus, ovaries, adnexal regions, and pelvic cul-de-sac.  Comparison:  Pelvic ultrasound 03/17/2013  Findings:  Uterus:  6.8 x 3.2 x 3.7 cm.  Normal appearance.  Endometrium: 3.9 mm, homogeneous.  Right ovary: 4.4 x 2.6 x 2.4 cm.  Normal appearance.  Left ovary: 4.7 x 1.9 x 2.0 cm.  Normal appearance.  Other Findings:  No free fluid  IMPRESSION: Normal study. No evidence of pelvic mass or other significant abnormality.    Original Report Authenticated By: Norva Pavlov, M.D.   Toradol 60 mg IM for pain initially - no relief. Dilaudid 1 mg IM given with some relief.   Flagyl 2000 mg PO in MAU for Trichomoniasis. Rocephin 250 mg IM given in MAU for presumed PID considering significant tenderness on exam and slightly elevated WBC.   Assessment and Plan   1. Trichomoniasis of vagina  2. PID (acute pelvic inflammatory disease)   Precautions rev'd, f/u in clinic in 2 weeks. Rev'd partner treatment.     Medication List    TAKE these medications       doxycycline 50 MG capsule  Commonly known as:  VIBRAMYCIN  Take 2 capsules (100 mg total) by mouth 2 (two) times daily.     ibuprofen 200 MG tablet  Commonly known as:  ADVIL,MOTRIN  Take 600 mg by mouth every 6 (six) hours as needed for pain.     oxyCODONE-acetaminophen 5-325 MG per tablet  Commonly known as:  PERCOCET/ROXICET  Take 1 tablet by mouth every 4 (four) hours as needed for pain.        Follow-up Information   Follow up with [facility] In 2 weeks. (someone will call to schedule)    Contact information:   [address] [place] [address] [phone]        [facility] 04/10/2013, 4:26 PM

## 2013-04-10 NOTE — MAU Note (Signed)
Pt states hx of ruptured ovarian cysts, has had intense abd pain that has progressed since yesterday. Was at work and pain got to point that pt could no longer tolerate it. Pt tearful, kneeling on floor in triage. Declined wheelchair multiple times.

## 2013-04-11 ENCOUNTER — Telehealth: Payer: Self-pay | Admitting: *Deleted

## 2013-04-11 ENCOUNTER — Encounter (HOSPITAL_COMMUNITY): Payer: Self-pay | Admitting: *Deleted

## 2013-04-11 ENCOUNTER — Emergency Department (HOSPITAL_COMMUNITY)
Admission: EM | Admit: 2013-04-11 | Discharge: 2013-04-12 | Disposition: A | Discharge status: Home / Self Care | Payer: BC Managed Care – PPO | Attending: Emergency Medicine | Admitting: Emergency Medicine

## 2013-04-11 DIAGNOSIS — Z9114 Patient's other noncompliance with medication regimen: Secondary | ICD-10-CM

## 2013-04-11 DIAGNOSIS — Z8619 Personal history of other infectious and parasitic diseases: Secondary | ICD-10-CM

## 2013-04-11 DIAGNOSIS — N949 Unspecified condition associated with female genital organs and menstrual cycle: Secondary | ICD-10-CM | POA: Insufficient documentation

## 2013-04-11 DIAGNOSIS — Z8659 Personal history of other mental and behavioral disorders: Secondary | ICD-10-CM | POA: Insufficient documentation

## 2013-04-11 DIAGNOSIS — F17200 Nicotine dependence, unspecified, uncomplicated: Secondary | ICD-10-CM | POA: Insufficient documentation

## 2013-04-11 DIAGNOSIS — Z9119 Patient's noncompliance with other medical treatment and regimen: Secondary | ICD-10-CM | POA: Insufficient documentation

## 2013-04-11 DIAGNOSIS — Z862 Personal history of diseases of the blood and blood-forming organs and certain disorders involving the immune mechanism: Secondary | ICD-10-CM | POA: Insufficient documentation

## 2013-04-11 DIAGNOSIS — R110 Nausea: Secondary | ICD-10-CM | POA: Insufficient documentation

## 2013-04-11 DIAGNOSIS — R102 Pelvic and perineal pain: Secondary | ICD-10-CM

## 2013-04-11 DIAGNOSIS — R109 Unspecified abdominal pain: Secondary | ICD-10-CM | POA: Insufficient documentation

## 2013-04-11 DIAGNOSIS — Z8639 Personal history of other endocrine, nutritional and metabolic disease: Secondary | ICD-10-CM | POA: Insufficient documentation

## 2013-04-11 DIAGNOSIS — Z91199 Patient's noncompliance with other medical treatment and regimen due to unspecified reason: Secondary | ICD-10-CM | POA: Insufficient documentation

## 2013-04-11 DIAGNOSIS — Z3202 Encounter for pregnancy test, result negative: Secondary | ICD-10-CM | POA: Insufficient documentation

## 2013-04-11 LAB — GC/CHLAMYDIA PROBE AMP
CT Probe RNA: NEGATIVE
GC Probe RNA: NEGATIVE

## 2013-04-11 NOTE — ED Notes (Addendum)
Pt reports being seen at [facility] hospital yesterday and treated for PID.  Reports being scripts for percocet and antibiotics, but will be unable to pick them up till Thursday.  States that she missed work today for same symptoms, and needs work note for today and something for pain tonight.

## 2013-04-11 NOTE — Telephone Encounter (Signed)
Pt left a message stating that she needs a note to be out of work. She went to MAU yesterday and called Dorene Grebe this morning to see if she could get another note for today. I advised patient that since she wasn't seen in the clinic we are unable to give her a letter to be out of work.  Pt became upset and stated that we are ridiculous and hung up the phone.

## 2013-04-12 ENCOUNTER — Emergency Department (HOSPITAL_COMMUNITY): Payer: BC Managed Care – PPO

## 2013-04-12 LAB — URINALYSIS, ROUTINE W REFLEX MICROSCOPIC
Bilirubin Urine: NEGATIVE
Glucose, UA: NEGATIVE mg/dL
Nitrite: NEGATIVE
Specific Gravity, Urine: 1.01 (ref 1.005–1.030)
pH: 6 (ref 5.0–8.0)

## 2013-04-12 LAB — URINE MICROSCOPIC-ADD ON

## 2013-04-12 LAB — WET PREP, GENITAL
Clue Cells Wet Prep HPF POC: NONE SEEN
Trich, Wet Prep: NONE SEEN
Yeast Wet Prep HPF POC: NONE SEEN

## 2013-04-12 MED ORDER — ONDANSETRON HCL 4 MG/2ML IJ SOLN
4.0000 mg | Freq: Once | INTRAMUSCULAR | Status: AC
  Administered 2013-04-12: 4 mg via INTRAVENOUS
  Filled 2013-04-12: qty 2

## 2013-04-12 MED ORDER — METRONIDAZOLE 500 MG PO TABS
2000.0000 mg | ORAL_TABLET | Freq: Once | ORAL | Status: AC
  Administered 2013-04-12: 2000 mg via ORAL
  Filled 2013-04-12: qty 4

## 2013-04-12 MED ORDER — AZITHROMYCIN 250 MG PO TABS
2000.0000 mg | ORAL_TABLET | Freq: Once | ORAL | Status: AC
  Administered 2013-04-12: 2000 mg via ORAL
  Filled 2013-04-12: qty 8

## 2013-04-12 MED ORDER — KETOROLAC TROMETHAMINE 30 MG/ML IJ SOLN
15.0000 mg | Freq: Once | INTRAMUSCULAR | Status: AC
Start: 2013-04-12 — End: 2013-04-12
  Administered 2013-04-12: 15 mg via INTRAVENOUS
  Filled 2013-04-12: qty 1

## 2013-04-12 MED ORDER — OXYCODONE-ACETAMINOPHEN 5-325 MG PO TABS
2.0000 | ORAL_TABLET | Freq: Once | ORAL | Status: AC
  Administered 2013-04-12: 2 via ORAL
  Filled 2013-04-12: qty 2

## 2013-04-12 MED ORDER — KETAMINE HCL 10 MG/ML IJ SOLN: 8.0000 mg | Freq: Once | INTRAMUSCULAR | Status: DC

## 2013-04-12 MED ORDER — PROMETHAZINE HCL 25 MG/ML IJ SOLN
25.0000 mg | Freq: Once | INTRAMUSCULAR | Status: AC
  Administered 2013-04-12: 25 mg via INTRAVENOUS
  Filled 2013-04-12: qty 1

## 2013-04-12 MED ORDER — KETAMINE HCL 10 MG/ML IJ SOLN
8.0000 mg | Freq: Once | INTRAMUSCULAR | Status: AC
  Administered 2013-04-12: 8 mg via INTRAVENOUS
  Filled 2013-04-12: qty 1

## 2013-04-12 MED ORDER — FENTANYL CITRATE 0.05 MG/ML IJ SOLN
INTRAMUSCULAR | Status: AC
  Administered 2013-04-12: 50 ug
  Filled 2013-04-12: qty 2

## 2013-04-12 NOTE — ED Provider Notes (Signed)
History     CSN: [id number]  Arrival date & time 04/11/13  2038   None     Chief Complaint  Patient presents with  . Pelvic Pain    HPI Amber Ray is a 22 y.o. female presenting with pelvic pain. Patient has been evaluated for pelvic pain in the past, in on 6/2 she had an ultrasound of her pelvis showing no cysts on the ovaries, but she was positive for trichomonas, she was treated for PID and got one shot of ceftriaxone however did not get her prescription for doxycycline filled. She also did not take any metronidazole. She presents this evening with suprapubic pelvic pain is 10 out of 10, cramping, currently menstruating, associated with mild nausea, no vomiting, no diarrhea, no fever, chills, no other abdominal pain, no shortness of breath or chest pain. Pain has been ongoing and constant, it did not occur acutely.   Past Medical History  Diagnosis Date  . Anxiety   . Chlamydia   . Gonorrhea   . Depression   . Bipolar affective disorder, depressed, mild   . 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 Dermoid cyst     Past Surgical History  Procedure Laterality Date  . No past surgeries    . Laparoscopy  07/27/2012    Procedure: LAPAROSCOPY OPERATIVE;  Surgeon: Catalina Antigua, MD;  Location: [facility];  Service: Gynecology;  Laterality: N/A;  . Ovarian cyst removal  07/27/2012    Procedure: OVARIAN CYSTECTOMY;  Surgeon: Catalina Antigua, MD;  Location: [facility];  Service: Gynecology;  Laterality: Right;  Dermoid Cyst    Family History  Problem Relation Age of Onset  . Diabetes Maternal Grandmother   . Arthritis Maternal Grandmother   . Arthritis Maternal Grandfather   . Diabetes Paternal Grandmother   . Hypertension Paternal Grandmother   . Arthritis Paternal Grandmother   . Kidney disease Paternal Grandmother   . Arthritis Paternal Grandfather     History  Substance Use Topics  . Smoking status: Current Every Day Smoker -- 0.50 packs/day for 2 years  .  Smokeless tobacco: Never Used  . Alcohol Use: 0.5 oz/week    1 drink(s) per week     Comment: occasionally    OB History   Grav Para Term Preterm Abortions TAB SAB Ect Mult Living   1    1  1    0      Review of Systems At least 10pt or greater review of systems completed and are negative except where specified in the HPI.  Allergies  Review of patient's allergies indicates no known allergies.  Home Medications   Current Outpatient Rx  Name  Route  Sig  Dispense  Refill  . ibuprofen (ADVIL,MOTRIN) 200 MG tablet   Oral   Take 600 mg by mouth every 6 (six) hours as needed for pain.         Marland Kitchen doxycycline (VIBRAMYCIN) 50 MG capsule   Oral   Take 2 capsules (100 mg total) by mouth 2 (two) times daily.   28 capsule   0   . oxyCODONE-acetaminophen (PERCOCET/ROXICET) 5-325 MG per tablet   Oral   Take 1 tablet by mouth every 4 (four) hours as needed for pain.   20 tablet   0     BP 127/99  Pulse 64  Temp(Src) 98.2 F (36.8 C) (Oral)  Resp 18  Ht 5\' 4"  (1.626 m)  Wt 180 lb (  81.647 kg)  BMI 30.88 kg/m2  SpO2 97%  LMP 04/07/2013  Physical Exam  Nursing notes reviewed.  Electronic medical record reviewed. VITAL SIGNS:   Filed Vitals:   04/11/13 2130 04/12/13 0242 04/12/13 0410  BP: 127/99 103/80 114/84  Pulse: 64 57 82  Temp: 98.2 F (36.8 C)    TempSrc: Oral    Resp: 18    Height: 5\' 4"  (1.626 m)    Weight: 180 lb (81.647 kg)    SpO2: 97% 100% 100%   CONSTITUTIONAL: Awake, oriented, appears non-toxic HENT: Atraumatic, normocephalic, oral mucosa pink and moist, airway patent. Nares patent without drainage. External ears normal. EYES: Conjunctiva clear, EOMI, PERRLA NECK: Trachea midline, non-tender, supple CARDIOVASCULAR: Normal heart rate, Normal rhythm, No murmurs, rubs, gallops PULMONARY/CHEST: Clear to auscultation, no rhonchi, wheezes, or rales. Symmetrical breath sounds. Non-tender. ABDOMINAL: Non-distended, soft, non-tender - no rebound or guarding.   BS normal. NEUROLOGIC: Non-focal, moving all four extremities, no gross sensory or motor deficits. EXTREMITIES: No clubbing, cyanosis, or edema SKIN: Warm, Dry, No erythema, No rash PELVIC EXAM: normal external genitalia, vulva, vagina, cervical motion tenderness, uterus and adnexa tender to palpation without masses appreciated ED Course  Procedures (including critical care time)  Labs Reviewed  WET PREP, GENITAL - Abnormal; Notable for the following:    WBC, Wet Prep HPF POC FEW (*)    All other components within normal limits  URINALYSIS, ROUTINE W REFLEX MICROSCOPIC - Abnormal; Notable for the following:    Hgb urine dipstick LARGE (*)    All other components within normal limits  URINE MICROSCOPIC-ADD ON - Abnormal; Notable for the following:    Squamous Epithelial / LPF FEW (*)    All other components within normal limits  GC/CHLAMYDIA PROBE AMP  PREGNANCY, URINE   US Transvaginal Non-ob  04/10/2013   *RADIOLOGY REPORT*  Clinical Data: Severe pelvic pain.  Elevated white count of 12.2. LMP 04/07/2013.  TRANSVAGINAL ULTRASOUND OF PELVIS  Technique:  Transvaginal ultrasound examination of the pelvis was performed including evaluation of the uterus, ovaries, adnexal regions, and pelvic cul-de-sac.  Comparison:  Pelvic ultrasound 03/17/2013  Findings:  Uterus:  6.8 x 3.2 x 3.7 cm.  Normal appearance.  Endometrium: 3.9 mm, homogeneous.  Right ovary: 4.4 x 2.6 x 2.4 cm.  Normal appearance.  Left ovary: 4.7 x 1.9 x 2.0 cm.  Normal appearance.  Other Findings:  No free fluid  IMPRESSION: Normal study. No evidence of pelvic mass or other significant abnormality.   Original Report Authenticated By: Norva Pavlov, M.D.     1. Pelvic pain   2. H/O trichomonal vaginitis   3. Noncompliance with medication regimen       MDM  Amber Ray is a 22 y.o. female presenting with pelvic pain -patient has incompletely treated pelvic inflammatory disease, she has cervical motion tenderness on  physical exam. I do not think this is appendicitis, likewise I doubt tubo-ovarian abscess with a recent ultrasound, she is also nontoxic and afebrile, with stable vital signs. Patient is not pregnant, I do not think this is an ovarian torsion as it's not consistent with her history and physical.  Treat the patient's pelvic pain, have completely treated the patient for trichomonas, and Chlamydia. [facility]   I explained the diagnosis and have given explicit precautions to return to the ER including worsening pain, vomiting, fevers or any other new or worsening symptoms. The patient understands and accepts the medical plan as it's been dictated  and I have answered their questions. Discharge instructions concerning home care and prescriptions have been given.  The patient is STABLE and is discharged to home in good condition.     Jones Skene, MD 04/12/13 [phone]

## 2013-04-13 LAB — GC/CHLAMYDIA PROBE AMP: CT Probe RNA: NEGATIVE

## 2013-04-24 ENCOUNTER — Encounter: Payer: BC Managed Care – PPO | Admitting: Family

## 2013-05-08 ENCOUNTER — Encounter (HOSPITAL_COMMUNITY): Payer: Self-pay | Admitting: *Deleted

## 2013-05-08 ENCOUNTER — Emergency Department (HOSPITAL_COMMUNITY)
Admission: EM | Admit: 2013-05-08 | Discharge: 2013-05-08 | Disposition: A | Discharge status: Home / Self Care | Payer: BC Managed Care – PPO | Attending: Emergency Medicine | Admitting: Emergency Medicine

## 2013-05-08 DIAGNOSIS — F17200 Nicotine dependence, unspecified, uncomplicated: Secondary | ICD-10-CM | POA: Insufficient documentation

## 2013-05-08 DIAGNOSIS — Z8659 Personal history of other mental and behavioral disorders: Secondary | ICD-10-CM | POA: Insufficient documentation

## 2013-05-08 DIAGNOSIS — K029 Dental caries, unspecified: Secondary | ICD-10-CM | POA: Insufficient documentation

## 2013-05-08 DIAGNOSIS — K0889 Other specified disorders of teeth and supporting structures: Secondary | ICD-10-CM

## 2013-05-08 DIAGNOSIS — K089 Disorder of teeth and supporting structures, unspecified: Secondary | ICD-10-CM | POA: Insufficient documentation

## 2013-05-08 DIAGNOSIS — Z8742 Personal history of other diseases of the female genital tract: Secondary | ICD-10-CM | POA: Insufficient documentation

## 2013-05-08 DIAGNOSIS — Z8619 Personal history of other infectious and parasitic diseases: Secondary | ICD-10-CM | POA: Insufficient documentation

## 2013-05-08 MED ORDER — DICLOFENAC SODIUM 50 MG PO TBEC: 50.0000 mg | DELAYED_RELEASE_TABLET | Freq: Two times a day (BID) | ORAL | Status: DC

## 2013-05-08 MED ORDER — AMOXICILLIN 500 MG PO CAPS: 500.0000 mg | ORAL_CAPSULE | Freq: Three times a day (TID) | ORAL | Status: DC

## 2013-05-08 NOTE — ED Notes (Signed)
Left sided dental pain x 2 months.

## 2013-05-08 NOTE — ED Provider Notes (Signed)
History    CSN: [id number] Arrival date & time 05/08/13  4098  First MD Initiated Contact with Patient 05/08/13 1024     Chief Complaint  Patient presents with  . Dental Pain   (Consider location/radiation/quality/duration/timing/severity/associated sxs/prior Treatment) HPI Comments: Amber Ray is a 22 y.o. female who presents to the Emergency Department complaining of dental pain that has been  waxing and waning for at least two months.  Pain worse for several days.  Pain with hot or cold foods or fluids.  Unable to see her dentist until her insurance begins in another 5 months.  She denies bleeding, facial swelling, trismus, or difficulty swallowing or breathing.    The history is provided by the patient.   Past Medical History  Diagnosis Date  . Anxiety   . Chlamydia   . Gonorrhea   . Depression   . Bipolar affective disorder, depressed, mild   . 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 Dermoid cyst    Past Surgical History  Procedure Laterality Date  . No past surgeries    . Laparoscopy  07/27/2012    Procedure: LAPAROSCOPY OPERATIVE;  Surgeon: Catalina Antigua, MD;  Location: [facility];  Service: Gynecology;  Laterality: N/A;  . Ovarian cyst removal  07/27/2012    Procedure: OVARIAN CYSTECTOMY;  Surgeon: Catalina Antigua, MD;  Location: [facility];  Service: Gynecology;  Laterality: Right;  Dermoid Cyst   Family History  Problem Relation Age of Onset  . Diabetes Maternal Grandmother   . Arthritis Maternal Grandmother   . Arthritis Maternal Grandfather   . Diabetes Paternal Grandmother   . Hypertension Paternal Grandmother   . Arthritis Paternal Grandmother   . Kidney disease Paternal Grandmother   . Arthritis Paternal Grandfather    History  Substance Use Topics  . Smoking status: Current Every Day Smoker -- 0.50 packs/day for 2 years    Types: Cigarettes  . Smokeless tobacco: Never Used  . Alcohol Use: 0.5 oz/week    1 drink(s) per week   Comment: occasionally   OB History   Grav Para Term Preterm Abortions TAB SAB Ect Mult Living   1    1  1    0     Review of Systems  Constitutional: Negative for fever and appetite change.  HENT: Positive for dental problem. Negative for congestion, sore throat, facial swelling, trouble swallowing, neck pain and neck stiffness.   Eyes: Negative for pain and visual disturbance.  Neurological: Negative for dizziness, facial asymmetry and headaches.  Hematological: Negative for adenopathy.  All other systems reviewed and are negative.    Allergies  Review of patient's allergies indicates no known allergies.  Home Medications   Current Outpatient Rx  Name  Route  Sig  Dispense  Refill  . ibuprofen (ADVIL,MOTRIN) 200 MG tablet   Oral   Take 600 mg by mouth every 6 (six) hours as needed for pain.         Marland Kitchen amoxicillin (AMOXIL) 500 MG capsule   Oral   Take 1 capsule (500 mg total) by mouth 3 (three) times daily.   30 capsule   0   . diclofenac (VOLTAREN) 50 MG EC tablet   Oral   Take 1 tablet (50 mg total) by mouth 2 (two) times daily. Take with food   20 tablet   0    BP 110/73  Pulse 90  Temp(Src) 98.3 F (36.8 C) (Oral)  Resp  18  SpO2 99%  LMP 04/27/2013 Physical Exam  Nursing note and vitals reviewed. Constitutional: She is oriented to person, place, and time. She appears well-developed and well-nourished. No distress.  HENT:  Head: Normocephalic and atraumatic. No trismus in the jaw.  Right Ear: Tympanic membrane and ear canal normal.  Left Ear: Tympanic membrane and ear canal normal.  Mouth/Throat: Uvula is midline, oropharynx is clear and moist and mucous membranes are normal. Dental caries present. No dental abscesses or edematous.    Partial avulsion of the left lower first molar. No facial edema, abscess, trismus, or sublingual abnml  Neck: Normal range of motion. Neck supple.  Cardiovascular: Normal rate, regular rhythm, normal heart sounds and intact  distal pulses.   No murmur heard. Pulmonary/Chest: Effort normal and breath sounds normal. No respiratory distress.  Musculoskeletal: Normal range of motion.  Lymphadenopathy:    She has no cervical adenopathy.  Neurological: She is alert and oriented to person, place, and time. She exhibits normal muscle tone. Coordination normal.  Skin: Skin is warm and dry.    ED Course  Procedures (including critical care time) Labs Reviewed - No data to display No results found. 1. Pain, dental     MDM    Airway patent, no facial edema, abscess, or trismus.  No clinical sx's of Ludwig angina.  Referral list given.  Pt agrees to f/u with a dentist   Daissy Yerian L. Trisha Mangle, PA-C 05/09/13 1824

## 2013-05-10 NOTE — ED Provider Notes (Signed)
Medical screening examination/treatment/procedure(s) were performed by non-physician practitioner and as supervising physician I was immediately available for consultation/collaboration. Travontae Freiberger, MD, FACEP   [place] Festa L Soffia Doshier, MD 05/10/13 1710 

## 2013-08-01 ENCOUNTER — Encounter (HOSPITAL_COMMUNITY): Payer: Self-pay

## 2013-08-01 ENCOUNTER — Emergency Department (HOSPITAL_COMMUNITY)
Admission: EM | Admit: 2013-08-01 | Discharge: 2013-08-01 | Disposition: A | Discharge status: Home / Self Care | Payer: Self-pay | Attending: Emergency Medicine | Admitting: Emergency Medicine

## 2013-08-01 DIAGNOSIS — R1110 Vomiting, unspecified: Secondary | ICD-10-CM | POA: Insufficient documentation

## 2013-08-01 DIAGNOSIS — Z3202 Encounter for pregnancy test, result negative: Secondary | ICD-10-CM | POA: Insufficient documentation

## 2013-08-01 DIAGNOSIS — A499 Bacterial infection, unspecified: Secondary | ICD-10-CM | POA: Insufficient documentation

## 2013-08-01 DIAGNOSIS — Z8742 Personal history of other diseases of the female genital tract: Secondary | ICD-10-CM | POA: Insufficient documentation

## 2013-08-01 DIAGNOSIS — Z8659 Personal history of other mental and behavioral disorders: Secondary | ICD-10-CM | POA: Insufficient documentation

## 2013-08-01 DIAGNOSIS — F17200 Nicotine dependence, unspecified, uncomplicated: Secondary | ICD-10-CM | POA: Insufficient documentation

## 2013-08-01 DIAGNOSIS — Z791 Long term (current) use of non-steroidal anti-inflammatories (NSAID): Secondary | ICD-10-CM | POA: Insufficient documentation

## 2013-08-01 DIAGNOSIS — N760 Acute vaginitis: Secondary | ICD-10-CM | POA: Insufficient documentation

## 2013-08-01 DIAGNOSIS — K029 Dental caries, unspecified: Secondary | ICD-10-CM | POA: Insufficient documentation

## 2013-08-01 DIAGNOSIS — B9689 Other specified bacterial agents as the cause of diseases classified elsewhere: Secondary | ICD-10-CM | POA: Insufficient documentation

## 2013-08-01 LAB — URINALYSIS, ROUTINE W REFLEX MICROSCOPIC
Bilirubin Urine: NEGATIVE
Hgb urine dipstick: NEGATIVE
Ketones, ur: NEGATIVE mg/dL
Protein, ur: NEGATIVE mg/dL
Urobilinogen, UA: 0.2 mg/dL (ref 0.0–1.0)

## 2013-08-01 LAB — WET PREP, GENITAL: Yeast Wet Prep HPF POC: NONE SEEN

## 2013-08-01 MED ORDER — PENICILLIN V POTASSIUM 500 MG PO TABS: 500.0000 mg | ORAL_TABLET | Freq: Four times a day (QID) | ORAL | Status: DC

## 2013-08-01 MED ORDER — IBUPROFEN 800 MG PO TABS: 800.0000 mg | ORAL_TABLET | Freq: Three times a day (TID) | ORAL | Status: DC

## 2013-08-01 MED ORDER — IBUPROFEN 400 MG PO TABS
600.0000 mg | ORAL_TABLET | Freq: Once | ORAL | Status: AC
  Administered 2013-08-01: 600 mg via ORAL
  Filled 2013-08-01: qty 2

## 2013-08-01 MED ORDER — BUPIVACAINE HCL (PF) 0.5 % IJ SOLN
10.0000 mL | Freq: Once | INTRAMUSCULAR | Status: AC
  Administered 2013-08-01: 10 mL
  Filled 2013-08-01: qty 30

## 2013-08-01 MED ORDER — METRONIDAZOLE 500 MG PO TABS: 500.0000 mg | ORAL_TABLET | Freq: Two times a day (BID) | ORAL | Status: DC

## 2013-08-01 MED ORDER — LIDOCAINE HCL (PF) 2 % IJ SOLN
2.0000 mL | Freq: Once | INTRAMUSCULAR | Status: AC
  Administered 2013-08-01: 11:00:00
  Filled 2013-08-01 (×2): qty 10

## 2013-08-01 NOTE — ED Notes (Signed)
Pt c/o dental pain for the past few months.  Also c/o pain in lower abd x 1 week off and on.  Denies any abnormal vaginal bleeding but has noticed a discharge for the past few days.

## 2013-08-01 NOTE — Progress Notes (Signed)
ED/CM noted patient did not have health insurance and/or PCP listed in the computer.  Patient was given the [place] resource handout with information on the clinics, food pantries, and the handout for new health insurance sign-up.  Patient expressed appreciation for this. 

## 2013-08-01 NOTE — ED Provider Notes (Signed)
This chart was scribed for [name], DO by Dorothey Baseman, ED Scribe. This patient was seen in room APA08/APA08 and the patient's care was started at 10:40 AM.   TIME SEEN: 10:40AM  CHIEF COMPLAINT: dental pain, abdominal pain  HPI:  HPI Comments: Amber Ray is a 22 y.o. female with no significant past medical history who presents to the Emergency Department complaining of right-sided dental pain secondary to having cracked right-sided wisdom teeth. She has had pain for several months and states she cannot afford to see a dentist. She expresses concern about an infection but denies any fever, facial swelling. Her pain is worse with opening her mouth completely. It is sharp, stabbing, moderate in nature. Denies any alleviating factors. She states that the pain shoots up into the right ear and along the face and neck. Patient reports that she does not currently have a dentist.   Patient also reports severe, intermittent abdominal pain for the past several years that has been worsening this past month. She reports a history of ovarian cyst removal. She states that this type of pain feels similar to her past ovarian cyst. She states that she has an appointment at the [facility] in October. She denies vaginal bleeding, dysuria, fever, diarrhea, melena, hematochezia, or hematuria. She reports associated light white vaginal discharge and one episode of emesis this morning. Patient is currently sexually active with one partner and does not use protection. Patient reports a history of Chlamydia 2 years ago and trichomoniasis a few months ago.    ROS: See HPI Constitutional: no fever  Eyes: no drainage  ENT: no runny nose   Cardiovascular:  no chest pain  Resp: no SOB  GI: no vomiting GU: no dysuria Integumentary: no rash  Allergy: no hives  Musculoskeletal: no leg swelling  Neurological: no slurred speech ROS otherwise negative  PAST MEDICAL HISTORY/PAST SURGICAL HISTORY:  Past  Medical History  Diagnosis Date  . Anxiety   . Chlamydia   . Gonorrhea   . Depression   . Bipolar affective disorder, depressed, mild   . 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 Dermoid cyst     MEDICATIONS:  Prior to Admission medications   Medication Sig Start Date End Date Taking? Authorizing Provider  amoxicillin (AMOXIL) 500 MG capsule Take 1 capsule (500 mg total) by mouth 3 (three) times daily. 05/08/13   Tammy L. Triplett, PA-C  diclofenac (VOLTAREN) 50 MG EC tablet Take 1 tablet (50 mg total) by mouth 2 (two) times daily. Take with food 05/08/13   Tammy L. Triplett, PA-C  ibuprofen (ADVIL,MOTRIN) 200 MG tablet Take 600 mg by mouth every 6 (six) hours as needed for pain.    Historical Provider, MD    ALLERGIES:  No Known Allergies  SOCIAL HISTORY:  History  Substance Use Topics  . Smoking status: Current Every Day Smoker -- 0.50 packs/day for 2 years    Types: Cigarettes  . Smokeless tobacco: Never Used  . Alcohol Use: 0.5 oz/week    1 drink(s) per week     Comment: occasionally    FAMILY HISTORY: Family History  Problem Relation Age of Onset  . Diabetes Maternal Grandmother   . Arthritis Maternal Grandmother   . Arthritis Maternal Grandfather   . Diabetes Paternal Grandmother   . Hypertension Paternal Grandmother   . Arthritis Paternal Grandmother   . Kidney disease Paternal Grandmother   . Arthritis Paternal Grandfather  EXAM:  Triage Vitals: BP 122/82  Pulse 111  Temp(Src) 98.5 F (36.9 C) (Oral)  Resp 20  Ht 5\' 4"  (1.626 m)  Wt 187 lb (84.823 kg)  BMI 32.08 kg/m2  SpO2 99%  LMP 07/22/2013  CONSTITUTIONAL: Alert and oriented and responds appropriately to questions. Well-appearing; well-nourished HEAD: Normocephalic EYES: Conjunctivae clear, PERRL ENT: normal nose; no rhinorrhea; moist mucous membranes; pharynx without lesions noted; right second upper molar and right 3rd lower molar are cracked. No obvious dental abscess. No  trismus. No drooling. No facial swelling or submandibular swelling. NECK: Supple, no meningismus, no LAD  CARD: RRR; S1 and S2 appreciated; no murmurs, no clicks, no rubs, no gallops RESP: Normal chest excursion without splinting or tachypnea; breath sounds clear and equal bilaterally; no wheezes, no rhonchi, no rales,  ABD/GI: Normal bowel sounds; non-distended; soft, non-tender, no rebound, no guarding GU:  No external genital lesions, no cervical motion tenderness, no adnexal tenderness or fullness, no vaginal bleeding, minimal amount of thick white discharge BACK:  The back appears normal and is non-tender to palpation, there is no CVA tenderness EXT: Normal ROM in all joints; non-tender to palpation; no edema; normal capillary refill; no cyanosis    SKIN: Normal color for age and race; warm NEURO: Moves all extremities equally PSYCH: The patient's mood and manner are appropriate. Grooming and personal hygiene are appropriate.  MEDICAL DECISION MAKING: 10:46AM- patient with several months of abdominal pain and dental pain.  Will order labs, an injection of bupivacaine, antibiotics, and ibuprofen.  Informed patient I would not give her narcotics for her dental pain and have recommended that she followup with a dentist since this has been going on for several months. For patient's pelvic pain, will send urinalysis, urine pregnancy and pelvic exam with cultures. Her abdominal exam is benign and I do not feel she needs imaging at this time. Discussed treatment plan with patient at bedside and patient verbalized agreement.    ED PROGRESS: Right upper and lower dental block performed. Pelvic exam unremarkable. Wet prep pending.  Patient has bacterial vaginosis. We'll treat with Flagyl. Given dental followup information. Given return precautions. Patient verbalizes understanding is comfortable with plan.  DENTAL BLOCK  Right upper and lower intraoral dental block performed. I used 5 mL of 2%  bupivacaine mixed with 5 mL of 2% lidocaine. Injected 5 mL of anesthetic into her upper right, and 5 ML of anesthetic into her lower right,. The patient tolerated the procedure well. She obtained good anesthesia.   I personally performed the services described in this documentation, which was scribed in my presence. The recorded information has been reviewed and is accurate.   [name], DO 08/01/13 1651

## 2013-08-02 LAB — GC/CHLAMYDIA PROBE AMP
CT Probe RNA: NEGATIVE
GC Probe RNA: NEGATIVE

## 2013-08-02 NOTE — Care Management ED Note (Signed)
       CARE MANAGEMENT ED NOTE 08/01/2013  Patient:  SHILOH, SOUTHERN   Account Number:  [id number]  Date Initiated:  08/01/2013  Documentation initiated by:  Anibal Henderson  Subjective/Objective Assessment:     Subjective/Objective Assessment Detail:   ED/CM noted patient did not have health insurance and/or PCP listed in the computer.  Patient was given the [facility] with information on the clinics, food pantries, and the handout for new health insurance sign-up.  Patient expressed appreciation for this.     Action/Plan:   Action/Plan Detail:   Anticipated DC Date:  08/01/2013     Status Recommendation to Physician:   Result of Recommendation:        Choice offered to / List presented to:            Status of service:  Completed, signed off  ED Comments:   ED Comments Detail:

## 2013-08-21 ENCOUNTER — Ambulatory Visit (INDEPENDENT_AMBULATORY_CARE_PROVIDER_SITE_OTHER): Payer: BC Managed Care – PPO | Admitting: Obstetrics & Gynecology

## 2013-08-21 ENCOUNTER — Encounter: Payer: Self-pay | Admitting: Obstetrics & Gynecology

## 2013-08-21 ENCOUNTER — Encounter: Payer: BC Managed Care – PPO | Admitting: Obstetrics & Gynecology

## 2013-08-21 VITALS — BP 122/74 | HR 92 | Temp 98.0°F | Wt 185.6 lb

## 2013-08-21 DIAGNOSIS — N926 Irregular menstruation, unspecified: Secondary | ICD-10-CM

## 2013-08-21 DIAGNOSIS — N979 Female infertility, unspecified: Secondary | ICD-10-CM

## 2013-08-21 DIAGNOSIS — E282 Polycystic ovarian syndrome: Secondary | ICD-10-CM | POA: Insufficient documentation

## 2013-08-21 MED ORDER — METFORMIN HCL 500 MG PO TABS: ORAL_TABLET | ORAL | Status: DC

## 2013-08-21 MED ORDER — CLOMIPHENE CITRATE 50 MG PO TABS
50.0000 mg | ORAL_TABLET | Freq: Every day | ORAL | Status: DC
Start: 2013-08-21 — End: 2013-11-05

## 2013-08-21 MED ORDER — METFORMIN HCL 850 MG PO TABS: 850.0000 mg | ORAL_TABLET | Freq: Two times a day (BID) | ORAL | Status: DC

## 2013-08-21 NOTE — Patient Instructions (Signed)
Return to clinic for any scheduled appointments or for any gynecologic concerns as needed.   

## 2013-08-21 NOTE — Progress Notes (Signed)
Pt. C/o of constant lower abdomen/pelvic pain that is shooting in nature and radiates to upper quadrants as well as back. Pt. States has been more frequent for the past week or two, but has been occuring for a year or two. Pt. Had surgery for a cyst last year and was treated for bacterial vaginosis a couple weeks ago. Pt. Had labs drawn for PCOS for two months ago but has not been notified of results, saw them in [place] but does not understand what they mean.  Pt. Refuses flu vaccine today.

## 2013-08-22 ENCOUNTER — Encounter: Payer: Self-pay | Admitting: Obstetrics & Gynecology

## 2013-08-22 NOTE — Progress Notes (Signed)
GYNECOLOGY CLINIC ENCOUNTER NOTE  History:  22 y.o. G1P0010 here today for follow up of PCOS evaluation done in 02/2013.  She also reported rare episodes of upper abdominal pain but is not concerned about this. She is more concerned about PCOS and infertility, wants to get pregnant. She has been trying for over a year without success.   The following portions of the patient's history were reviewed and updated as appropriate: allergies, current medications, past family history, past medical history, past social history, past surgical history and problem list.  Normal pap on 02/08/13.  Review of Systems:  Pertinent items are noted in HPI.  Objective:  BP 122/74  Pulse 92  Temp(Src) 98 F (36.7 C) (Oral)  Wt 185 lb 9.6 oz (84.188 kg)  BMI 31.84 kg/m2  LMP 07/20/2013 Physical Exam deferred  Labs and Imaging 02/08/13 labs showed normal FSH, TSH, CBC. HgA1C was 5.9 % (impaired glucose tolerance).  Elevated free testosterone at 14.8 pg/ml.  Assessment & Plan:   Counseled patient about management of PCOS, recommended weight loss which helps with restoring ovulatory cycles, decreases glucose intolerance with improvement of metabolic risk, improves fertility/pregnancy rates and helps with overall health.  Even modest weight loss (5 to 10 percent reduction in body weight) in women with PCOS may result in these effects.    For patient desiring pregnancy,  PCOS is treated with Metformin given its association with glucose intolerance and insulin resistance.  Over 50% of PCOS patients on 1500mg  of Metformin daily have been shown to ovulate successfully. Common side effects include GI intolerance, kidney and liver enzyme irregularities, lactic acidosis.   She will also be instructed to stop therapy if she anticipates major stresses, such as surgery or IVF, in order to avoid lactic acidosis. She was prescribed Metformin 500 mg po daily x 7 days , then bid x 7 days, then tid.   If patient tolerates Metformin  500mg  tid, she will be continued on this dose or can be switched to Metformin 850 mg po bid. Will continue to follow.  Patient will return in 3 months for followup and repeat CMET check.   The patient was presented with management with  Clomid in addition to Metformin to maximize chances of conception.    She was e-prescribed Clomid 50 mg po qd to take on days 5-9 of her cycle.  The risks of Clomid including ovarian hyperstimulation with possible risk of ovarian cancer as well as multiple gestation were discussed with patient.  Patient also advised to continue frequent intercourse especially around her ovulation date, ovulation predictor kits will help with this.   If patient does not get pregnant this cycle, she can try another cycle of Clomid 50 mg but on days 1-5 of her cycle.  Patient  was advised that if she does not have her period/bleeding at the end of each cycle, she should do a pregnancy test.  If no pregnancy occurs, may increase to 100 mg of clomiphene or consider administration of Letrozole /Femara . For women with PCOS and BMI ?30 kg/m2, letrozole can result in higher cumulative live birth rates than Clomid.  Patents should be advised that ovulation induction is an off-label use of letrozole.   Patient also advised about LH/ovulation predictor kits,  which can help in knowing when to have frequent intercourse at least every other day around the time of ovulation.  The next steps in evaluation of infertility involve doing a semen analysis then HSG as subsequent evaluation.  Patient was given the option to be referred to a Reproductive Endocrinology and Infertility Specialist now or at any time, she declines referral for now.   Bleeding precautions reviewed. Patient will return in 3 months for followup PCOS,  repeat CMET check and further discussion about management of infertility.   Total encounter time : 25 minutes  Jaynie Collins, MD, FACOG Attending Obstetrician & Gynecologist Faculty  Practice, [facility]

## 2013-09-14 ENCOUNTER — Other Ambulatory Visit: Payer: Self-pay

## 2013-11-05 ENCOUNTER — Emergency Department (HOSPITAL_COMMUNITY)
Admission: EM | Admit: 2013-11-05 | Discharge: 2013-11-05 | Disposition: A | Discharge status: Home / Self Care | Payer: Self-pay | Attending: Emergency Medicine | Admitting: Emergency Medicine

## 2013-11-05 ENCOUNTER — Encounter (HOSPITAL_COMMUNITY): Payer: Self-pay | Admitting: Emergency Medicine

## 2013-11-05 DIAGNOSIS — K089 Disorder of teeth and supporting structures, unspecified: Secondary | ICD-10-CM | POA: Insufficient documentation

## 2013-11-05 DIAGNOSIS — F17200 Nicotine dependence, unspecified, uncomplicated: Secondary | ICD-10-CM | POA: Insufficient documentation

## 2013-11-05 DIAGNOSIS — Z79899 Other long term (current) drug therapy: Secondary | ICD-10-CM | POA: Insufficient documentation

## 2013-11-05 DIAGNOSIS — Z8742 Personal history of other diseases of the female genital tract: Secondary | ICD-10-CM | POA: Insufficient documentation

## 2013-11-05 DIAGNOSIS — Z8659 Personal history of other mental and behavioral disorders: Secondary | ICD-10-CM | POA: Insufficient documentation

## 2013-11-05 DIAGNOSIS — K0889 Other specified disorders of teeth and supporting structures: Secondary | ICD-10-CM

## 2013-11-05 DIAGNOSIS — Z8619 Personal history of other infectious and parasitic diseases: Secondary | ICD-10-CM | POA: Insufficient documentation

## 2013-11-05 MED ORDER — BUPIVACAINE-EPINEPHRINE PF 0.25-1:200000 % IJ SOLN
10.0000 mL | Freq: Once | INTRAMUSCULAR | Status: AC
  Administered 2013-11-05: 10 mL
  Filled 2013-11-05: qty 30

## 2013-11-05 MED ORDER — PENICILLIN V POTASSIUM 500 MG PO TABS: 500.0000 mg | ORAL_TABLET | Freq: Four times a day (QID) | ORAL | Status: AC

## 2013-11-05 NOTE — ED Provider Notes (Signed)
CSN: [id number]     Arrival date & time 11/05/13  1642 History  This chart was scribed for non-physician practitioner Antony Madura, PA-C, working with Hurman Horn, MD by Dorothey Baseman, ED Scribe. This patient was seen in room WTR6/WTR6 and the patient's care was started at 8:35 PM.    Chief Complaint  Patient presents with  . Dental Pain   The history is provided by the patient. No language interpreter was used.   HPI Comments: Amber Ray is a 22 y.o. female who presents to the Emergency Department complaining of a constant pain to the bilateral lower dentition and right-sided upper dentition secondary to cracked dentition and dental caries onset 2-3 days ago. She reports using Orajel and taking ibuprofen at home without relief. She denies fever, mouth bleeding, shortness of breath, inability to swallow, or inability to open her mouth. Patient has been seen at [facility] ED several times for similar complaints, but states that she has not followed with the referred dentists. Patient states that she was last seen by a dentist in January or February of this year. Patient has a history of anxiety, depression, and bipolar affective disorder.   Past Medical History  Diagnosis Date  . Anxiety   . Chlamydia   . Gonorrhea   . Depression   . Bipolar affective disorder, depressed, mild   . 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 Dermoid cyst    Past Surgical History  Procedure Laterality Date  . No past surgeries    . Laparoscopy  07/27/2012    Procedure: LAPAROSCOPY OPERATIVE;  Surgeon: Catalina Antigua, MD;  Location: [facility];  Service: Gynecology;  Laterality: N/A;  . Ovarian cyst removal  07/27/2012    Procedure: OVARIAN CYSTECTOMY;  Surgeon: Catalina Antigua, MD;  Location: [facility];  Service: Gynecology;  Laterality: Right;  Dermoid Cyst   Family History  Problem Relation Age of Onset  . Diabetes Maternal Grandmother   . Arthritis Maternal Grandmother   . Arthritis Maternal  Grandfather   . Diabetes Paternal Grandmother   . Hypertension Paternal Grandmother   . Arthritis Paternal Grandmother   . Kidney disease Paternal Grandmother   . Arthritis Paternal Grandfather    History  Substance Use Topics  . Smoking status: Current Every Day Smoker -- 0.50 packs/day for 2 years    Types: Cigarettes  . Smokeless tobacco: Never Used  . Alcohol Use: 0.5 oz/week    1 drink(s) per week     Comment: occasionally   OB History   Grav Para Term Preterm Abortions TAB SAB Ect Mult Living   1    1  1    0     Review of Systems  Constitutional: Negative for fever.  HENT: Positive for dental problem. Negative for trouble swallowing.   Respiratory: Negative for shortness of breath.   All other systems reviewed and are negative.    Allergies  Review of patient's allergies indicates no known allergies.  Home Medications   Current Outpatient Rx  Name  Route  Sig  Dispense  Refill  . benzocaine (ORAJEL) 10 % mucosal gel   Mouth/Throat   Use as directed 1 application in the mouth or throat as needed for mouth pain.         Marland Kitchen ibuprofen (ADVIL,MOTRIN) 200 MG tablet   Oral   Take 600-800 mg by mouth every 6 (six) hours as needed for pain.          Marland Kitchen  metFORMIN (GLUCOPHAGE) 850 MG tablet   Oral   Take 1 tablet (850 mg total) by mouth 2 (two) times daily with a meal.   60 tablet   12    Triage Vitals: BP 122/67  Pulse 70  Temp(Src) 98.2 F (36.8 C) (Oral)  SpO2 96%  LMP 10/16/2013  Physical Exam  Nursing note and vitals reviewed. Constitutional: She is oriented to person, place, and time. She appears well-developed and well-nourished. No distress.  HENT:  Head: Normocephalic and atraumatic.  Cracked upper and lower wisdom teeth on the right. Missing first molar on the right. Dental caries. No gingival lesions or bleeding. No area of fluctuance. Uvula is midline.   Eyes: Conjunctivae and EOM are normal. No scleral icterus.  Neck: Normal range of motion.   Cardiovascular: Normal rate, regular rhythm and normal heart sounds.   Pulmonary/Chest: Effort normal and breath sounds normal. No stridor. No respiratory distress.  Musculoskeletal: Normal range of motion.  Neurological: She is alert and oriented to person, place, and time.  Skin: Skin is warm and dry. No rash noted. She is not diaphoretic. No erythema. No pallor.  Psychiatric: She has a normal mood and affect. Her behavior is normal.    ED Course  Dental Date/Time: 11/16/2013 7:45 PM Performed by: Antony Madura Authorized by: Antony Madura Consent: Verbal consent obtained. written consent not obtained. The procedure was performed in an emergent situation. Risks and benefits: risks, benefits and alternatives were discussed Consent given by: patient Patient understanding: patient states understanding of the procedure being performed Patient consent: the patient's understanding of the procedure matches consent given Procedure consent: procedure consent matches procedure scheduled Relevant documents: relevant documents present and verified Test results: test results available and properly labeled Site marked: the operative site was marked Imaging studies: imaging studies available Required items: required blood products, implants, devices, and special equipment available Patient identity confirmed: verbally with patient and arm band Time out: Immediately prior to procedure a "time out" was called to verify the correct patient, procedure, equipment, support staff and site/side marked as required. Preparation: Patient was prepped and draped in the usual sterile fashion. Local anesthesia used: yes Anesthesia: nerve block Local anesthetic: bupivacaine 0.25% with epinephrine Anesthetic total: 6 ml Patient sedated: no Patient tolerance: Patient tolerated the procedure well with no immediate complications. Comments: 2ml of marcaine used for each dental block. Total of 3 blocks performed: b/l  inferior alveolar nerve blocks and R middle superior alveolar nerve block. Patient tolerated well with good pain control.   (including critical care time)  DIAGNOSTIC STUDIES: Oxygen Saturation is 96% on room air, normal by my interpretation.    COORDINATION OF CARE: 8:41 PM- Will perform dental blocks of the 3 affected areas. Advised patient to follow up with the referred dentist in 48 hours. Advised patient to continue using ibuprofen at home to manage symptoms. Discussed treatment plan with patient at bedside and patient verbalized agreement.   Labs Review Labs Reviewed - No data to display Imaging Review No results found.  EKG Interpretation   None       MDM   1. Dentalgia    Uncomplicated dentalgia. Patient well and nontoxic appearing and hemodynamically stable, and afebrile. Uvula midline without evidence of peritonsillar abscess. Patient tolerating secretions without difficulty. No evidence of dental abscess or area of fluctuance. No red flags or signs concerning for Ludwig's angina or spread of infection. Patient treated in ED with dental nerve block x3 with relief of dental pain. She  is stable for discharge with dental followup. Penicillin prescribed to cover for infection. Return precautions discussed and patient agreeable to plan with no unaddressed concerns.  I personally performed the services described in this documentation, which was scribed in my presence. The recorded information has been reviewed and is accurate.      Antony Madura, PA-C 11/16/13 1948

## 2013-11-05 NOTE — ED Notes (Signed)
Patient is from home. Patient c/o tooth pain in the upper and lower teeth that has been going on for 2 days. Patient reports having one tooth pulled before. Patient was last seen by dentist in Jan or Feb this year. Patient has taken tylenol for the pain.

## 2013-11-05 NOTE — ED Notes (Signed)
[facility]S AT BS

## 2013-11-06 NOTE — ED Provider Notes (Signed)
Medical screening examination/treatment/procedure(s) were performed by non-physician practitioner and as supervising physician I was immediately available for consultation/collaboration.  Crissy Mccreadie M Zayden Maffei, MD 11/06/13 1629 

## 2013-11-18 NOTE — ED Provider Notes (Signed)
Medical screening examination/treatment/procedure(s) were performed by non-physician practitioner and as supervising physician I was immediately available for consultation/collaboration.  EKG Interpretation   None        Babette Relic, MD 11/18/13 1252

## 2014-03-21 ENCOUNTER — Encounter (HOSPITAL_COMMUNITY): Payer: Self-pay | Admitting: *Deleted

## 2014-03-21 ENCOUNTER — Inpatient Hospital Stay (HOSPITAL_COMMUNITY): Payer: Self-pay

## 2014-03-21 ENCOUNTER — Inpatient Hospital Stay (HOSPITAL_COMMUNITY)
Admission: AD | Admit: 2014-03-21 | Discharge: 2014-03-21 | Disposition: A | Discharge status: Home / Self Care | Payer: Self-pay | Source: Ambulatory Visit | Attending: Obstetrics & Gynecology | Admitting: Obstetrics & Gynecology

## 2014-03-21 DIAGNOSIS — R1012 Left upper quadrant pain: Secondary | ICD-10-CM | POA: Insufficient documentation

## 2014-03-21 DIAGNOSIS — N8300 Follicular cyst of ovary, unspecified side: Secondary | ICD-10-CM | POA: Insufficient documentation

## 2014-03-21 DIAGNOSIS — F17200 Nicotine dependence, unspecified, uncomplicated: Secondary | ICD-10-CM | POA: Insufficient documentation

## 2014-03-21 LAB — URINALYSIS, ROUTINE W REFLEX MICROSCOPIC
BILIRUBIN URINE: NEGATIVE
GLUCOSE, UA: NEGATIVE mg/dL
Hgb urine dipstick: NEGATIVE
KETONES UR: NEGATIVE mg/dL
LEUKOCYTES UA: NEGATIVE
Nitrite: NEGATIVE
PH: 7 (ref 5.0–8.0)
Protein, ur: NEGATIVE mg/dL
SPECIFIC GRAVITY, URINE: 1.025 (ref 1.005–1.030)
Urobilinogen, UA: 0.2 mg/dL (ref 0.0–1.0)

## 2014-03-21 LAB — WET PREP, GENITAL
Trich, Wet Prep: NONE SEEN
YEAST WET PREP: NONE SEEN

## 2014-03-21 LAB — CBC
HEMATOCRIT: 40 % (ref 36.0–46.0)
HEMOGLOBIN: 13.5 g/dL (ref 12.0–15.0)
MCH: 29.4 pg (ref 26.0–34.0)
MCHC: 33.8 g/dL (ref 30.0–36.0)
MCV: 87.1 fL (ref 78.0–100.0)
Platelets: 215 10*3/uL (ref 150–400)
RBC: 4.59 MIL/uL (ref 3.87–5.11)
RDW: 13 % (ref 11.5–15.5)
WBC: 14.6 10*3/uL — ABNORMAL HIGH (ref 4.0–10.5)

## 2014-03-21 LAB — POCT PREGNANCY, URINE: Preg Test, Ur: NEGATIVE

## 2014-03-21 MED ORDER — TRAMADOL HCL 50 MG PO TABS
100.0000 mg | ORAL_TABLET | Freq: Once | ORAL | Status: AC
  Administered 2014-03-21: 100 mg via ORAL
  Filled 2014-03-21: qty 2

## 2014-03-21 MED ORDER — KETOROLAC TROMETHAMINE 10 MG PO TABS: 10.0000 mg | ORAL_TABLET | Freq: Four times a day (QID) | ORAL | Status: DC

## 2014-03-21 MED ORDER — TRAMADOL HCL 50 MG PO TABS: 50.0000 mg | ORAL_TABLET | Freq: Four times a day (QID) | ORAL | Status: DC | PRN

## 2014-03-21 NOTE — MAU Note (Signed)
Pt states she has been having vaginal discomfort and vaginal bleeding that started May 6-7, bleeding got heavier and pain increased.Pt states she had a cyst on her ovary 1-2 years ago" that was the same type of pain"

## 2014-03-21 NOTE — MAU Provider Note (Signed)
Chief Complaint: Abdominal Pain   First Provider Initiated Contact with Patient 03/21/14 0306     SUBJECTIVE HPI: Amber Ray is a 23 y.o. G0P0000 female who presents with pain in right lower quadrant pain x1 week, much worse today. Feels it when she had a cyst a few years ago. Describes pain as sharp, constant with intermittent exacerbations. Rates pain 8/10 on pain scale. Minimal relief with ibuprofen and warm compresses. Pain is severe enough to interfere with work and sleep. LMP 03/14/2014. Was very heavy with clots. Initially attributed pain to passing clots, but bleeding and clots have subsided and pain is worsened. Has history of PCOS. History of dermoid cyst 3 years ago, removed 07/2012. Ultrasound one year ago showed no cysts.  Past Medical History  Diagnosis Date  . Anxiety   . Chlamydia   . Gonorrhea   . Depression   . Bipolar affective disorder, depressed, mild   . 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 Dermoid cyst    OB History  Gravida Para Term Preterm AB SAB TAB Ectopic Multiple Living  0    0 0    0       Past Surgical History  Procedure Laterality Date  . No past surgeries    . Laparoscopy  07/27/2012    Procedure: LAPAROSCOPY OPERATIVE;  Surgeon: Mora Bellman, MD;  Location: [facility];  Service: Gynecology;  Laterality: N/A;  . Ovarian cyst removal  07/27/2012    Procedure: OVARIAN CYSTECTOMY;  Surgeon: Mora Bellman, MD;  Location: [facility];  Service: Gynecology;  Laterality: Right;  Dermoid Cyst   History   Social History  . Marital Status: Single    Spouse Name: N/A    Number of Children: N/A  . Years of Education: N/A   Occupational History  . Not on file.   Social History Main Topics  . Smoking status: Current Every Day Smoker -- 0.50 packs/day for 2 years    Types: Cigarettes  . Smokeless tobacco: Never Used  . Alcohol Use: 0.5 oz/week    1 drink(s) per week     Comment: occasionally  . Drug Use: No  . Sexual Activity: Yes     Birth Control/ Protection: Condom   Other Topics Concern  . Not on file   Social History Narrative  . No narrative on file   No current facility-administered medications on file prior to encounter.   Current Outpatient Prescriptions on File Prior to Encounter  Medication Sig Dispense Refill  . benzocaine (ORAJEL) 10 % mucosal gel Use as directed 1 application in the mouth or throat as needed for mouth pain.      Marland Kitchen ibuprofen (ADVIL,MOTRIN) 200 MG tablet Take 600-800 mg by mouth every 6 (six) hours as needed for pain.       . metFORMIN (GLUCOPHAGE) 850 MG tablet Take 1 tablet (850 mg total) by mouth 2 (two) times daily with a meal.  60 tablet  12   No Known Allergies  ROS: Pertinent positive items in HPI. Negative for fever, chills, dysuria, urgency, frequency, hematuria, flank pain, nausea, vomiting, diarrhea, constipation, dyspareunia, intermenstrual bleeding, vaginal discharge. Normal appetite.  OBJECTIVE Last menstrual period 03/14/2014. GENERAL: Well-developed, well-nourished female in mild distress.  HEENT: Normocephalic HEART: normal rate RESP: normal effort ABDOMEN: Soft, mild right groin tenderness. No tenderness at McBurney's point. Negative rebound or mass. Positive bowel sounds x4. Negative CVA tenderness. EXTREMITIES: Nontender, no edema NEURO: Alert and  oriented SPECULUM EXAM: NEFG, physiologic discharge, no blood noted, cervix clean BIMANUAL: cervix closed; uterus normal size, ? Small right tender adnexal masses. No left adnexal mass or tenderness. No cervical motion tenderness.  LAB RESULTS Results for orders placed during the hospital encounter of 03/21/14 (from the past 24 hour(s))  URINALYSIS, ROUTINE W REFLEX MICROSCOPIC     Status: None   Collection Time    03/21/14  2:07 AM      Result Value Ref Range   Color, Urine YELLOW  YELLOW   APPearance CLEAR  CLEAR   Specific Gravity, Urine 1.025  1.005 - 1.030   pH 7.0  5.0 - 8.0   Glucose, UA NEGATIVE   NEGATIVE mg/dL   Hgb urine dipstick NEGATIVE  NEGATIVE   Bilirubin Urine NEGATIVE  NEGATIVE   Ketones, ur NEGATIVE  NEGATIVE mg/dL   Protein, ur NEGATIVE  NEGATIVE mg/dL   Urobilinogen, UA 0.2  0.0 - 1.0 mg/dL   Nitrite NEGATIVE  NEGATIVE   Leukocytes, UA NEGATIVE  NEGATIVE  POCT PREGNANCY, URINE     Status: None   Collection Time    03/21/14  2:34 AM      Result Value Ref Range   Preg Test, Ur NEGATIVE  NEGATIVE  CBC     Status: Abnormal   Collection Time    03/21/14  3:10 AM      Result Value Ref Range   WBC 14.6 (*) 4.0 - 10.5 K/uL   RBC 4.59  3.87 - 5.11 MIL/uL   Hemoglobin 13.5  12.0 - 15.0 g/dL   HCT 40.0  36.0 - 46.0 %   MCV 87.1  78.0 - 100.0 fL   MCH 29.4  26.0 - 34.0 pg   MCHC 33.8  30.0 - 36.0 g/dL   RDW 13.0  11.5 - 15.5 %   Platelets 215  150 - 400 K/uL  WET PREP, GENITAL     Status: Abnormal   Collection Time    03/21/14  3:15 AM      Result Value Ref Range   Yeast Wet Prep HPF POC NONE SEEN  NONE SEEN   Trich, Wet Prep NONE SEEN  NONE SEEN   Clue Cells Wet Prep HPF POC FEW (*) NONE SEEN   WBC, Wet Prep HPF POC RARE (*) NONE SEEN    IMAGING US Transvaginal Non-ob  03/21/2014   CLINICAL DATA:  Right lower quadrant pain.  History of ovarian cyst.  EXAM: TRANSABDOMINAL AND TRANSVAGINAL ULTRASOUND OF PELVIS  TECHNIQUE: Both transabdominal and transvaginal ultrasound examinations of the pelvis were performed. Transabdominal technique was performed for global imaging of the pelvis including uterus, ovaries, adnexal regions, and pelvic cul-de-sac. It was necessary to proceed with endovaginal exam following the transabdominal exam to visualize the uterus and ovaries.  COMPARISON:  DG ABD 2 VIEWS dated 04/12/2013; US TRANSVAGINAL NON-OB dated 04/10/2013; US PELVIS COMPLETE dated 03/17/2013  FINDINGS: Uterus  Measurements: 7.2 x 3.4 x 4.9 cm. No fibroids or other mass visualized.  Endometrium  Thickness: 7 mm. Fluid and heterogeneous debris within the endometrial cavity. No flow  is demonstrated. Changes could indicate blood clots or endometrial polyp.  Right ovary  Measurements: 5.1 x 3.1 x 4 cm. Dominant simple appearing cyst measuring 2.9 cm maximal diameter, likely a functional cyst.  Left ovary  Measurements: 3.3 x 2.1 x 1.7 cm. Normal appearance/no adnexal mass.  Other findings  No free fluid.  IMPRESSION: Fluid and heterogeneous echogenic material in the endometrium without flow.  Changes could represent blood clots versus endometrial polyp. No abnormal adnexal mass lesions.   Electronically Signed   By: Lucienne Capers M.D.   On: 03/21/2014 04:09   US Pelvis Complete  03/21/2014   CLINICAL DATA:  Right lower quadrant pain.  History of ovarian cyst.  EXAM: TRANSABDOMINAL AND TRANSVAGINAL ULTRASOUND OF PELVIS  TECHNIQUE: Both transabdominal and transvaginal ultrasound examinations of the pelvis were performed. Transabdominal technique was performed for global imaging of the pelvis including uterus, ovaries, adnexal regions, and pelvic cul-de-sac. It was necessary to proceed with endovaginal exam following the transabdominal exam to visualize the uterus and ovaries.  COMPARISON:  DG ABD 2 VIEWS dated 04/12/2013; US TRANSVAGINAL NON-OB dated 04/10/2013; US PELVIS COMPLETE dated 03/17/2013  FINDINGS: Uterus  Measurements: 7.2 x 3.4 x 4.9 cm. No fibroids or other mass visualized.  Endometrium  Thickness: 7 mm. Fluid and heterogeneous debris within the endometrial cavity. No flow is demonstrated. Changes could indicate blood clots or endometrial polyp.  Right ovary  Measurements: 5.1 x 3.1 x 4 cm. Dominant simple appearing cyst measuring 2.9 cm maximal diameter, likely a functional cyst.  Left ovary  Measurements: 3.3 x 2.1 x 1.7 cm. Normal appearance/no adnexal mass.  Other findings  No free fluid.  IMPRESSION: Fluid and heterogeneous echogenic material in the endometrium without flow. Changes could represent blood clots versus endometrial polyp. No abnormal adnexal mass lesions.    Electronically Signed   By: Lucienne Capers M.D.   On: 03/21/2014 04:09   MAU COURSE CBC, GC/CT, wet prep, ultrasound, Ultram.  Pain improving after Ultram.  ASSESSMENT 1. Ovarian follicular cyst     PLAN Discharge home in stable condition. Comfort measures. GC/CT cultures pending. Do not take Toradol within 6 hours of ibuprofen. Followup ultrasound in 8 weeks if no improvement.     Follow-up Information   Follow up with [facility]. (As needed, If symptoms worsen)    Contact information:   [place] [address] [phone]       Follow up with [facility]. (As needed in emergencies)    Contact information:   [address] [id number] [place] [address] [phone]       Medication List         benzocaine 10 % mucosal gel  Commonly known as:  ORAJEL  Use as directed 1 application in the mouth or throat as needed for mouth pain.     ibuprofen 200 MG tablet  Commonly known as:  ADVIL,MOTRIN  Take 600-800 mg by mouth every 6 (six) hours as needed for pain.     ketorolac 10 MG tablet  Commonly known as:  TORADOL  Take 1 tablet (10 mg total) by mouth every 6 (six) hours.     metFORMIN 850 MG tablet  Commonly known as:  GLUCOPHAGE  Take 1 tablet (850 mg total) by mouth 2 (two) times daily with a meal.     traMADol 50 MG tablet  Commonly known as:  ULTRAM  Take 1-2 tablets (50-100 mg total) by mouth every 6 (six) hours as needed.        [place], [place] 03/21/2014  4:58 AM

## 2014-03-21 NOTE — Discharge Instructions (Signed)

## 2014-03-22 ENCOUNTER — Telehealth: Payer: Self-pay

## 2014-03-22 ENCOUNTER — Encounter: Payer: Self-pay | Admitting: Obstetrics & Gynecology

## 2014-03-22 LAB — GC/CHLAMYDIA PROBE AMP
CT PROBE, AMP APTIMA: NEGATIVE
GC PROBE AMP APTIMA: NEGATIVE

## 2014-03-22 NOTE — Telephone Encounter (Signed)
F/u ultrasound scheduled for 05/16/14 at 0945. Called pt. No answer. Left message informing her of appointment date, time and location and with instructions to begin drinking 32oz of water an hour before as she will need a full bladder; stated she would receive an appointment to follow up in clinic for after the ultrasound within the next few days from our front office staff, call clinic if she does not hear from [place] or any questions or concerns.

## 2014-03-22 NOTE — MAU Provider Note (Signed)
Attestation of Attending Supervision of Advanced Practitioner (PA/CNM/NP): Evaluation and management procedures were performed by the Advanced Practitioner under my supervision and collaboration.  I have reviewed the Advanced Practitioner's note and chart, and I agree with the management and plan.  Annely Sliva, MD, FACOG Attending Obstetrician & Gynecologist Faculty Practice, [facility]  

## 2014-03-22 NOTE — Telephone Encounter (Signed)
Message copied by Geanie Logan on Thu Mar 22, 2014 10:01 AM ------      Message from: Debarah Crape A      Created: Thu Mar 22, 2014  9:03 AM                   ----- Message -----         From: Manya Silvas, CNM         Sent: 03/21/2014   5:20 AM           To: [facility] Admin Pool            Seen in maternity admissions 03/21/2014. Diagnosed with 2.9 cm ovarian cysts. Very painful. Please schedule followup visit after ultrasound approximately 05/17/2014. ------

## 2014-03-27 ENCOUNTER — Telehealth: Payer: Self-pay | Admitting: *Deleted

## 2014-03-27 NOTE — Telephone Encounter (Signed)
Pt left message stating that she was seen recently in the ER for pelvic pain and has clinic appt on 6/25. She wants to know what she is supposed to do about her pain between now and her appt. I called pt and discussed her concerns.  She related that she has been taking the Ultram without good pain relief.  The pain is at times so severe that she cannot work. I offered pt a work-in appt tomorrow @ 1500 and she agreed. I also explained that we will cancel her appt on 6/25 as she will not need to be seen again until after her pelvic US on 7/8.  Pt voiced understanding.

## 2014-03-28 ENCOUNTER — Ambulatory Visit (INDEPENDENT_AMBULATORY_CARE_PROVIDER_SITE_OTHER): Payer: Self-pay | Admitting: Obstetrics & Gynecology

## 2014-03-28 ENCOUNTER — Encounter: Payer: Self-pay | Admitting: Obstetrics & Gynecology

## 2014-03-28 VITALS — BP 114/78 | HR 94 | Temp 97.8°F | Ht 64.0 in | Wt 192.5 lb

## 2014-03-28 DIAGNOSIS — N949 Unspecified condition associated with female genital organs and menstrual cycle: Secondary | ICD-10-CM

## 2014-03-28 DIAGNOSIS — R102 Pelvic and perineal pain: Secondary | ICD-10-CM

## 2014-03-28 DIAGNOSIS — G8929 Other chronic pain: Secondary | ICD-10-CM

## 2014-03-28 MED ORDER — OXYCODONE-ACETAMINOPHEN 5-325 MG PO TABS: 1.0000 | ORAL_TABLET | Freq: Four times a day (QID) | ORAL | Status: DC | PRN

## 2014-03-28 MED ORDER — CEFTRIAXONE SODIUM 1 G IJ SOLR
250.0000 mg | Freq: Once | INTRAMUSCULAR | Status: AC
  Administered 2014-03-28: 250 mg via INTRAMUSCULAR

## 2014-03-28 MED ORDER — AZITHROMYCIN 250 MG PO TABS
1000.0000 mg | ORAL_TABLET | Freq: Once | ORAL | Status: AC
  Administered 2014-03-28: 1000 mg via ORAL

## 2014-03-28 NOTE — Progress Notes (Signed)
Subjective:     Patient ID: Amber Ray, female   DOB: Aug 16, 1991, 23 y.o.   MRN: [id number]  HPI Pt reports being seen in the MAU on 03/21/2014 and was evaluated for pelvic pain with CBC, cervical cx, sono and wet smear.  Everything was negative with the exception of a small 2.9cm cyst which looked functional per radiology.  She presents today with worsening pain.  She denies fever or chills but, does report a prolonged cycles with heavy bleeding that was much heavier than she has ever had.  She reports that the pain has kept her from being able to work sufficiently.  She has been sexually active since the visit on the 13th with no increased pain.  Past Medical History  Diagnosis Date  . Anxiety   . Chlamydia   . Gonorrhea   . Depression   . Bipolar affective disorder, depressed, mild   . 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 Dermoid cyst    Past Surgical History  Procedure Laterality Date  . No past surgeries    . Laparoscopy  07/27/2012    Procedure: LAPAROSCOPY OPERATIVE;  Surgeon: Mora Bellman, MD;  Location: [facility];  Service: Gynecology;  Laterality: N/A;  . Ovarian cyst removal  07/27/2012    Procedure: OVARIAN CYSTECTOMY;  Surgeon: Mora Bellman, MD;  Location: [facility];  Service: Gynecology;  Laterality: Right;  Dermoid Cyst   Current Outpatient Prescriptions on File Prior to Visit  Medication Sig Dispense Refill  . benzocaine (ORAJEL) 10 % mucosal gel Use as directed 1 application in the mouth or throat as needed for mouth pain.      Marland Kitchen ibuprofen (ADVIL,MOTRIN) 200 MG tablet Take 600-800 mg by mouth every 6 (six) hours as needed for pain.       . metFORMIN (GLUCOPHAGE) 850 MG tablet Take 1 tablet (850 mg total) by mouth 2 (two) times daily with a meal.  60 tablet  12   No current facility-administered medications on file prior to visit.  No Known Allergies History   Social History  . Marital Status: Single    Spouse Name: N/A    Number of Children:  N/A  . Years of Education: N/A   Occupational History  . Not on file.   Social History Main Topics  . Smoking status: Current Every Day Smoker -- 0.50 packs/day for 2 years    Types: Cigarettes  . Smokeless tobacco: Never Used  . Alcohol Use: 0.5 oz/week    1 drink(s) per week     Comment: occasionally  . Drug Use: No  . Sexual Activity: Yes    Birth Control/ Protection: Condom   Other Topics Concern  . Not on file   Social History Narrative  . No narrative on file    Review of Systems     Objective:   Physical Exam BP 114/78  Pulse 94  Temp(Src) 97.8 F (36.6 C) (Oral)  Ht 5\' 4"  (1.626 m)  Wt 192 lb 8 oz (87.317 kg)  BMI 33.03 kg/m2  LMP 03/14/2014 Pt in room crying and bending over in pain Abd: soft, ND; slightly tender on right side; no rebound; no guarding GU: EGBUS: no lesions Cervix: no lesion; +CMT Uterus: small, mobile Adnexa: no masses; sl tender on the right but, I can press down on the ovary with little response   03/21/2014 CLINICAL DATA: Right lower quadrant pain. History of ovarian cyst.  EXAM:  TRANSABDOMINAL AND TRANSVAGINAL ULTRASOUND OF PELVIS  TECHNIQUE:  Both transabdominal and transvaginal ultrasound examinations of the  pelvis were performed. Transabdominal technique was performed for  global imaging of the pelvis including uterus, ovaries, adnexal  regions, and pelvic cul-de-sac. It was necessary to proceed with  endovaginal exam following the transabdominal exam to visualize the  uterus and ovaries.  COMPARISON: DG ABD 2 VIEWS dated 04/12/2013; US TRANSVAGINAL NON-OB  dated 04/10/2013; US PELVIS COMPLETE dated 03/17/2013  FINDINGS:  Uterus  Measurements: 7.2 x 3.4 x 4.9 cm. No fibroids or other mass  visualized.  Endometrium  Thickness: 7 mm. Fluid and heterogeneous debris within the  endometrial cavity. No flow is demonstrated. Changes could indicate  blood clots or endometrial polyp.  Right ovary  Measurements: 5.1 x 3.1 x 4 cm.  Dominant simple appearing cyst  measuring 2.9 cm maximal diameter, likely a functional cyst.  Left ovary  Measurements: 3.3 x 2.1 x 1.7 cm. Normal appearance/no adnexal mass.  Other findings  No free fluid.  IMPRESSION:  Fluid and heterogeneous echogenic material in the endometrium  without flow. Changes could represent blood clots versus endometrial  polyp. No abnormal adnexal mass lesions.   CBC    Component Value Date/Time   WBC 14.6* 03/21/2014 0310   RBC 4.59 03/21/2014 0310   HGB 13.5 03/21/2014 0310   HCT 40.0 03/21/2014 0310   PLT 215 03/21/2014 0310   MCV 87.1 03/21/2014 0310   MCH 29.4 03/21/2014 0310   MCHC 33.8 03/21/2014 0310   RDW 13.0 03/21/2014 0310   LYMPHSABS 3.1 07/30/2012 0029   MONOABS 0.7 07/30/2012 0029   EOSABS 0.3 07/30/2012 0029   BASOSABS 0.1 07/30/2012 0029         Assessment:     Pelvic pain- cx neg on last visit but, still concerned with PID given elevated WBC and CMT  (and WBC's on wet mount)      Plan:     CBC today Percocet prn pain #20 Rocephin 250mg  IM  Azithromycin 1gram po F/u in 2 weeks or sooner prn

## 2014-03-28 NOTE — Patient Instructions (Addendum)
Pelvic Pain, Female Female pelvic pain can be caused by many different things and start from a variety of places. Pelvic pain refers to pain that is located in the lower half of the abdomen and between your hips. The pain may occur over a short period of time (acute) or may be reoccurring (chronic). The cause of pelvic pain may be related to disorders affecting the female reproductive organs (gynecologic), but it may also be related to the bladder, kidney stones, an intestinal complication, or muscle or skeletal problems. Getting help right away for pelvic pain is important, especially if there has been severe, sharp, or a sudden onset of unusual pain. It is also important to get help right away because some types of pelvic pain can be life threatening.  CAUSES  Below are only some of the causes of pelvic pain. The causes of pelvic pain can be in one of several categories.   Gynecologic.  Pelvic inflammatory disease.  Sexually transmitted infection.  Ovarian cyst or a twisted ovarian ligament (ovarian torsion).  Uterine lining that grows outside the uterus (endometriosis).  Fibroids, cysts, or tumors.  Ovulation.  Pregnancy.  Pregnancy that occurs outside the uterus (ectopic pregnancy).  Miscarriage.  Labor.  Abruption of the placenta or ruptured uterus.  Infection.  Uterine infection (endometritis).  Bladder infection.  Diverticulitis.  Miscarriage related to a uterine infection (septic abortion).  Bladder.  Inflammation of the bladder (cystitis).  Kidney stone(s).  Gastrointenstinal.  Constipation.  Diverticulitis.  Neurologic.  Trauma.  Feeling pelvic pain because of mental or emotional causes (psychosomatic).  Cancers of the bowel or pelvis. EVALUATION  Your caregiver will want to take a careful history of your concerns. This includes recent changes in your health, a careful gynecologic history of your periods (menses), and a sexual history. Obtaining  your family history and medical history is also important. Your caregiver may suggest a pelvic exam. A pelvic exam will help identify the location and severity of the pain. It also helps in the evaluation of which organ system may be involved. In order to identify the cause of the pelvic pain and be properly treated, your caregiver may order tests. These tests may include:   A pregnancy test.  Pelvic ultrasonography.  An X-ray exam of the abdomen.  A urinalysis or evaluation of vaginal discharge.  Blood tests. HOME CARE INSTRUCTIONS   Only take over-the-counter or prescription medicines for pain, discomfort, or fever as directed by your caregiver.   Rest as directed by your caregiver.   Eat a balanced diet.   Drink enough fluids to make your urine clear or pale yellow, or as directed.   Avoid sexual intercourse if it causes pain.   Apply warm or cold compresses to the lower abdomen depending on which one helps the pain.   Avoid stressful situations.   Keep a journal of your pelvic pain. Write down when it started, where the pain is located, and if there are things that seem to be associated with the pain, such as food or your menstrual cycle.  Follow up with your caregiver as directed.  SEEK MEDICAL CARE IF:  Your medicine does not help your pain.  You have abnormal vaginal discharge. SEEK IMMEDIATE MEDICAL CARE IF:   You have heavy bleeding from the vagina.   Your pelvic pain increases.   You feel lightheaded or faint.   You have chills.   You have pain with urination or blood in your urine.   You have uncontrolled  diarrhea or vomiting.   You have a fever or persistent symptoms for more than 3 days.  You have a fever and your symptoms suddenly get worse.   You are being physically or sexually abused.  MAKE SURE YOU:  Understand these instructions.  Will watch your condition.  Will get help if you are not doing well or get worse. Document  Released: 09/22/2004 Document Revised: 04/26/2012 Document Reviewed: 02/15/2012 [facility] Patient Information 2014 [place], [place]. Pelvic Inflammatory Disease Pelvic inflammatory disease (PID) refers to an infection in some or all of the female organs. The infection can be in the uterus, ovaries, fallopian tubes, or the surrounding tissues in the pelvis. PID can cause abdominal or pelvic pain that comes on suddenly (acute pelvic pain). PID is a serious infection because it can lead to lasting (chronic) pelvic pain or the inability to have children (infertile).  CAUSES  The infection is often caused by the normal bacteria found in the vaginal tissues. PID may also be caused by an infection that is spread during sexual contact. PID can also occur following:   The birth of a baby.   A miscarriage.   An abortion.   Major pelvic surgery.   The use of an intrauterine device (IUD).   A sexual assault.  RISK FACTORS Certain factors can put a person at higher risk for PID, such as:  Being younger than 25 years.  Being sexually active at [place] age.  Usingnonbarrier contraception.  Havingmultiple sexual partners.  Having sex with someone who has symptoms of a genital infection.  Using oral contraception. Other times, certain behaviors can increase the possibility of getting PID, such as:  Having sex during your period.  Using a vaginal douche.  Having an intrauterine device (IUD) in place. SYMPTOMS   Abdominal or pelvic pain.   Fever.   Chills.   Abnormal vaginal discharge.  Abnormal uterine bleeding.   Unusual pain shortly after finishing your period. DIAGNOSIS  Your caregiver will choose some of the following methods to make a diagnosis, such as:   Performinga physical exam and history. A pelvic exam typically reveals a very tender uterus and surrounding pelvis.   Ordering laboratory tests including a pregnancy test, blood tests, and urine  test.  Orderingcultures of the vagina and cervix to check for a sexually transmitted infection (STI).  Performing an ultrasound.   Performing a laparoscopic procedure to look inside the pelvis.  TREATMENT   Antibiotic medicines may be prescribed and taken by mouth.   Sexual partners may be treated when the infection is caused by a sexually transmitted disease (STD).   Hospitalization may be needed to give antibiotics intravenously.  Surgery may be needed, but this is rare. It may take weeks until you are completely well. If you are diagnosed with PID, you should also be checked for human immunodeficiency virus (HIV). HOME CARE INSTRUCTIONS   If given, take your antibiotics as directed. Finish the medicine even if you start to feel better.   Only take over-the-counter or prescription medicines for pain, discomfort, or fever as directed by your caregiver.   Do not have sexual intercourse until treatment is completed or as directed by your caregiver. If PID is confirmed, your recent sexual partner(s) will need treatment.   Keep your follow-up appointments. SEEK MEDICAL CARE IF:   You have increased or abnormal vaginal discharge.   You need prescription medicine for your pain.   You vomit.   You cannot take your medicines.  Your partner has an STD.  SEEK IMMEDIATE MEDICAL CARE IF:   You have a fever.   You have increased abdominal or pelvic pain.   You have chills.   You have pain when you urinate.   You are not better after 72 hours following treatment.  MAKE SURE YOU:   Understand these instructions.  Will watch your condition.  Will get help right away if you are not doing well or get worse. Document Released: 10/26/2005 Document Revised: 02/20/2013 Document Reviewed: 10/22/2011 [facility] Patient Information 2014 [place], [place].

## 2014-03-29 LAB — CBC
HCT: 40.2 % (ref 36.0–46.0)
Hemoglobin: 13.6 g/dL (ref 12.0–15.0)
MCH: 28.7 pg (ref 26.0–34.0)
MCHC: 33.8 g/dL (ref 30.0–36.0)
MCV: 84.8 fL (ref 78.0–100.0)
PLATELETS: 299 10*3/uL (ref 150–400)
RBC: 4.74 MIL/uL (ref 3.87–5.11)
RDW: 13.6 % (ref 11.5–15.5)
WBC: 12.9 10*3/uL — AB (ref 4.0–10.5)

## 2014-04-06 ENCOUNTER — Encounter: Payer: Self-pay | Admitting: General Practice

## 2014-04-27 ENCOUNTER — Ambulatory Visit: Payer: Self-pay | Admitting: Obstetrics & Gynecology

## 2014-05-03 ENCOUNTER — Encounter: Payer: Self-pay | Admitting: Obstetrics & Gynecology

## 2014-05-07 ENCOUNTER — Encounter: Payer: Self-pay | Admitting: General Practice

## 2014-05-16 ENCOUNTER — Ambulatory Visit (HOSPITAL_COMMUNITY): Payer: Self-pay | Attending: Advanced Practice Midwife

## 2014-05-23 ENCOUNTER — Encounter: Payer: Self-pay | Admitting: *Deleted

## 2014-05-23 NOTE — Progress Notes (Signed)
Pt was a no show at ultrasound appointment on 05/16/14.

## 2014-05-29 ENCOUNTER — Emergency Department (HOSPITAL_COMMUNITY)
Admission: EM | Admit: 2014-05-29 | Discharge: 2014-05-29 | Disposition: A | Discharge status: Home / Self Care | Payer: Self-pay | Attending: Emergency Medicine | Admitting: Emergency Medicine

## 2014-05-29 ENCOUNTER — Emergency Department (HOSPITAL_COMMUNITY): Payer: Self-pay

## 2014-05-29 ENCOUNTER — Encounter (HOSPITAL_COMMUNITY): Payer: Self-pay | Admitting: Emergency Medicine

## 2014-05-29 DIAGNOSIS — Z8619 Personal history of other infectious and parasitic diseases: Secondary | ICD-10-CM | POA: Insufficient documentation

## 2014-05-29 DIAGNOSIS — N83209 Unspecified ovarian cyst, unspecified side: Secondary | ICD-10-CM | POA: Insufficient documentation

## 2014-05-29 DIAGNOSIS — Z9889 Other specified postprocedural states: Secondary | ICD-10-CM | POA: Insufficient documentation

## 2014-05-29 DIAGNOSIS — Z8659 Personal history of other mental and behavioral disorders: Secondary | ICD-10-CM | POA: Insufficient documentation

## 2014-05-29 DIAGNOSIS — F17200 Nicotine dependence, unspecified, uncomplicated: Secondary | ICD-10-CM | POA: Insufficient documentation

## 2014-05-29 DIAGNOSIS — J159 Unspecified bacterial pneumonia: Secondary | ICD-10-CM | POA: Insufficient documentation

## 2014-05-29 DIAGNOSIS — Z79899 Other long term (current) drug therapy: Secondary | ICD-10-CM | POA: Insufficient documentation

## 2014-05-29 DIAGNOSIS — J189 Pneumonia, unspecified organism: Secondary | ICD-10-CM

## 2014-05-29 DIAGNOSIS — Z3202 Encounter for pregnancy test, result negative: Secondary | ICD-10-CM | POA: Insufficient documentation

## 2014-05-29 LAB — WET PREP, GENITAL
Clue Cells Wet Prep HPF POC: NONE SEEN
Trich, Wet Prep: NONE SEEN
Yeast Wet Prep HPF POC: NONE SEEN

## 2014-05-29 LAB — URINALYSIS, ROUTINE W REFLEX MICROSCOPIC
Bilirubin Urine: NEGATIVE
Glucose, UA: NEGATIVE mg/dL
Hgb urine dipstick: NEGATIVE
Ketones, ur: NEGATIVE mg/dL
LEUKOCYTES UA: NEGATIVE
Nitrite: NEGATIVE
PH: 6 (ref 5.0–8.0)
Protein, ur: NEGATIVE mg/dL
Specific Gravity, Urine: 1.025 (ref 1.005–1.030)
Urobilinogen, UA: 0.2 mg/dL (ref 0.0–1.0)

## 2014-05-29 LAB — CBC WITH DIFFERENTIAL/PLATELET
BASOS ABS: 0 10*3/uL (ref 0.0–0.1)
BASOS PCT: 0 % (ref 0–1)
EOS ABS: 0.4 10*3/uL (ref 0.0–0.7)
Eosinophils Relative: 4 % (ref 0–5)
HCT: 40.7 % (ref 36.0–46.0)
Hemoglobin: 13.4 g/dL (ref 12.0–15.0)
LYMPHS ABS: 3.5 10*3/uL (ref 0.7–4.0)
Lymphocytes Relative: 34 % (ref 12–46)
MCH: 28.7 pg (ref 26.0–34.0)
MCHC: 32.9 g/dL (ref 30.0–36.0)
MCV: 87.2 fL (ref 78.0–100.0)
Monocytes Absolute: 0.7 10*3/uL (ref 0.1–1.0)
Monocytes Relative: 7 % (ref 3–12)
NEUTROS PCT: 56 % (ref 43–77)
Neutro Abs: 5.8 10*3/uL (ref 1.7–7.7)
Platelets: 214 10*3/uL (ref 150–400)
RBC: 4.67 MIL/uL (ref 3.87–5.11)
RDW: 12.8 % (ref 11.5–15.5)
WBC: 10.4 10*3/uL (ref 4.0–10.5)

## 2014-05-29 LAB — BASIC METABOLIC PANEL
ANION GAP: 9 (ref 5–15)
BUN: 8 mg/dL (ref 6–23)
CALCIUM: 8.4 mg/dL (ref 8.4–10.5)
CO2: 26 mEq/L (ref 19–32)
CREATININE: 0.66 mg/dL (ref 0.50–1.10)
Chloride: 105 mEq/L (ref 96–112)
GFR calc Af Amer: >90 mL/min (ref >90)
Glucose, Bld: 113 mg/dL — ABNORMAL HIGH (ref 70–99)
Potassium: 4.2 mEq/L (ref 3.7–5.3)
SODIUM: 140 meq/L (ref 137–147)

## 2014-05-29 LAB — PREGNANCY, URINE: Preg Test, Ur: NEGATIVE

## 2014-05-29 MED ORDER — OXYCODONE-ACETAMINOPHEN 5-325 MG PO TABS
2.0000 | ORAL_TABLET | Freq: Once | ORAL | Status: AC
  Administered 2014-05-29: 2 via ORAL
  Filled 2014-05-29: qty 2

## 2014-05-29 MED ORDER — AZITHROMYCIN 250 MG PO TABS
500.0000 mg | ORAL_TABLET | Freq: Once | ORAL | Status: AC
  Administered 2014-05-29: 500 mg via ORAL
  Filled 2014-05-29: qty 2

## 2014-05-29 MED ORDER — AZITHROMYCIN 250 MG PO TABS: 250.0000 mg | ORAL_TABLET | Freq: Every day | ORAL | Status: DC

## 2014-05-29 MED ORDER — OXYCODONE-ACETAMINOPHEN 5-325 MG PO TABS: 1.0000 | ORAL_TABLET | Freq: Three times a day (TID) | ORAL | Status: DC | PRN

## 2014-05-29 NOTE — ED Notes (Signed)
Pt alert & oriented x4, stable gait. Patient given discharge instructions, paperwork & prescription(s). Patient  instructed to stop at the registration desk to finish any additional paperwork. Patient verbalized understanding. Pt left department w/ no further questions. 

## 2014-05-29 NOTE — Discharge Instructions (Signed)
Pneumonia, Adult  Pneumonia is an infection of the lungs. It may be caused by a germ (virus or bacteria). Some types of pneumonia can spread easily from person to person. This can happen when you cough or sneeze.  HOME CARE   Only take medicine as told by your doctor.   Take your medicine (antibiotics) as told. Finish it even if you start to feel better.   Do not smoke.   You may use a vaporizer or humidifier in your room. This can help loosen thick spit (mucus).   Sleep so you are almost sitting up (semi-upright). This helps reduce coughing.   Rest.  A shot (vaccine) can help prevent pneumonia. Shots are often advised for:   People over 65 years old.   Patients on chemotherapy.   People with long-term (chronic) lung problems.   People with immune system problems.  GET HELP RIGHT AWAY IF:    You are getting worse.   You cannot control your cough, and you are losing sleep.   You cough up blood.   Your pain gets worse, even with medicine.   You have a fever.   Any of your problems are getting worse, not better.   You have shortness of breath or chest pain.  MAKE SURE YOU:    Understand these instructions.   Will watch your condition.   Will get help right away if you are not doing well or get worse.  Document Released: 04/13/2008 Document Revised: 01/18/2012 Document Reviewed: 01/16/2011  ExitCare Patient Information 2015 [employer]. This information is not intended to replace advice given to you by your health care provider. Make sure you discuss any questions you have with your health care provider.

## 2014-05-29 NOTE — ED Notes (Signed)
Also c/o cough for one week and started with SOB and chest tightness since Thursday.

## 2014-05-29 NOTE — ED Notes (Signed)
abd pain since yesterday.  Pt states i have  "PCOS"  Missed appointment 05/16/14 for ultrasound.  Rating pain 8.

## 2014-05-29 NOTE — ED Provider Notes (Signed)
CSN: [id number]     Arrival date & time 05/29/14  6314 History   First MD Initiated Contact with Patient 05/29/14 [phone]     Chief Complaint  Patient presents with  . Abdominal Pain     (Consider location/radiation/quality/duration/timing/severity/associated sxs/prior Treatment) HPI Comments: Pt comes with multiple complaints today. Pt state that she started having generalized lower abdominal pain that started yesterday. Pt states that she has pcos and has a cyst. She was supposed to have and [place] on 7/8 but she missed her appointment. Denies dysuria or vaginal discharge. No n/v/d, fever. Hasn't taken anything for pain. States that she has also been having a cough with sob for the last week. She state that she has been having cp that moves and is sharp in nature. Pt is a smoker.   The history is provided by the patient. No language interpreter was used.    Past Medical History  Diagnosis Date  . Anxiety   . Chlamydia   . Gonorrhea   . Depression   . Bipolar affective disorder, depressed, mild   . 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 Dermoid cyst    Past Surgical History  Procedure Laterality Date  . No past surgeries    . Laparoscopy  07/27/2012    Procedure: LAPAROSCOPY OPERATIVE;  Surgeon: Mora Bellman, MD;  Location: [facility];  Service: Gynecology;  Laterality: N/A;  . Ovarian cyst removal  07/27/2012    Procedure: OVARIAN CYSTECTOMY;  Surgeon: Mora Bellman, MD;  Location: [facility];  Service: Gynecology;  Laterality: Right;  Dermoid Cyst   Family History  Problem Relation Age of Onset  . Diabetes Maternal Grandmother   . Arthritis Maternal Grandmother   . Arthritis Maternal Grandfather   . Diabetes Paternal Grandmother   . Hypertension Paternal Grandmother   . Arthritis Paternal Grandmother   . Kidney disease Paternal Grandmother   . Arthritis Paternal Grandfather    History  Substance Use Topics  . Smoking status: Current Every Day Smoker -- 0.50  packs/day for 2 years    Types: Cigarettes  . Smokeless tobacco: Never Used  . Alcohol Use: 0.5 oz/week    1 drink(s) per week     Comment: occasionally   OB History   Grav Para Term Preterm Abortions TAB SAB Ect Mult Living   0    0  0   0     Review of Systems  Constitutional: Negative.   Respiratory: Negative.   Cardiovascular: Negative.       Allergies  Review of patient's allergies indicates no known allergies.  Home Medications   Prior to Admission medications   Medication Sig Start Date End Date Taking? Authorizing Provider  benzocaine (ORAJEL) 10 % mucosal gel Use as directed 1 application in the mouth or throat as needed for mouth pain.    Historical Provider, MD  ibuprofen (ADVIL,MOTRIN) 200 MG tablet Take 600-800 mg by mouth every 6 (six) hours as needed for pain.     Historical Provider, MD  metFORMIN (GLUCOPHAGE) 850 MG tablet Take 1 tablet (850 mg total) by mouth 2 (two) times daily with a meal. 08/21/13   Osborne Oman, MD  oxyCODONE-acetaminophen (PERCOCET/ROXICET) 5-325 MG per tablet Take 1-2 tablets by mouth every 6 (six) hours as needed. 03/28/14   Lavonia Drafts, MD   BP 107/93  Pulse 88  Temp(Src) 98 F (36.7 C) (Oral)  Resp 18  SpO2 100%  LMP 05/10/2014  Physical Exam  Nursing note and vitals reviewed. Constitutional: She is oriented to person, place, and time. She appears well-developed and well-nourished.  Cardiovascular: Normal rate and regular rhythm.   Pulmonary/Chest: Effort normal and breath sounds normal.  Abdominal: Soft. Bowel sounds are normal. There is tenderness in the right lower quadrant and left lower quadrant.  Genitourinary:  No discharge noted. No cmt  Musculoskeletal: Normal range of motion.  Neurological: She is alert and oriented to person, place, and time.  Skin: Skin is warm and dry.  Psychiatric: She has a normal mood and affect.    ED Course  Procedures (including critical care time) Labs Review Labs  Reviewed  WET PREP, GENITAL - Abnormal; Notable for the following:    WBC, Wet Prep HPF POC FEW (*)    All other components within normal limits  BASIC METABOLIC PANEL - Abnormal; Notable for the following:    Glucose, Bld 113 (*)    All other components within normal limits  GC/CHLAMYDIA PROBE AMP  URINALYSIS, ROUTINE W REFLEX MICROSCOPIC  PREGNANCY, URINE  CBC WITH DIFFERENTIAL    Imaging Review Dg Chest 2 View  05/29/2014   CLINICAL DATA:  Onset of chest pain today  EXAM: CHEST  2 VIEW  COMPARISON:  PA and lateral chest x-ray of May 27, 2012  FINDINGS: The lungs are mildly hypoinflated. There are coarse retrocardiac lung markings on the lateral film which likely lie on the left. The cardiac silhouette is top-normal in size. The central pulmonary vascularity is prominent. There is no cephalization of the pulmonary vascular pattern. There is no pleural effusion or pneumothorax.  IMPRESSION: The study is limited by hypoinflation. Left lower lobe atelectasis or pneumonia is suspected. A repeat PA and lateral chest series with deep inspiration would be useful if the patient's clinical situation warrants this.   Electronically Signed   By: David  Martinique   On: 05/29/2014 12:04   US Transvaginal Non-ob  05/29/2014   CLINICAL DATA:  Pelvic pain.  History of dermoid cyst.  EXAM: TRANSABDOMINAL AND TRANSVAGINAL ULTRASOUND OF PELVIS  TECHNIQUE: Both transabdominal and transvaginal ultrasound examinations of the pelvis were performed. Transabdominal technique was performed for global imaging of the pelvis including uterus, ovaries, adnexal regions, and pelvic cul-de-sac. It was necessary to proceed with endovaginal exam following the transabdominal exam to visualize the ovaries and endometrium.  COMPARISON:  03/21/2014  FINDINGS: Uterus  Measurements: 8.4 x 4.0 x 4.4 cm. No fibroids or other mass visualized.  Endometrium  Thickness: 8.6 mm. Fluid and echogenic debris in the lower uterine segment endometrial  canal not significantly change when compared to prior ultrasound. This could be clot, submucosal fibroid or endometrial polyp. Hysteroscopy may be helpful for further evaluation.  Right ovary  Measurements: 5.3 x 2.3 x 3.2 cm. Multiple small follicles and a 1.3 cm cyst. Septated paraovarian cyst versus hydrosalpinx.  Left ovary  Measurements: 5.0 x 2.5 x 4.8 cm. Multiple small follicles. Normal appearance/no adnexal mass.  Other findings  Small amount of free pelvic fluid.  IMPRESSION: 1. Fluid and echogenic debris in the lower uterine segment endometrial canal not significantly change when compared to prior ultrasound. This could be a echogenic clot, submucosal fibroid or endometrial polyp. Hysteroscopy may be helpful for further evaluation. 2. Septated paraovarian cyst versus hydrosalpinx on the right.   Electronically Signed   By: Kalman Jewels M.D.   On: 05/29/2014 12:03   US Pelvis Complete  05/29/2014   CLINICAL DATA:  Pelvic pain.  History of dermoid cyst.  EXAM: TRANSABDOMINAL AND TRANSVAGINAL ULTRASOUND OF PELVIS  TECHNIQUE: Both transabdominal and transvaginal ultrasound examinations of the pelvis were performed. Transabdominal technique was performed for global imaging of the pelvis including uterus, ovaries, adnexal regions, and pelvic cul-de-sac. It was necessary to proceed with endovaginal exam following the transabdominal exam to visualize the ovaries and endometrium.  COMPARISON:  03/21/2014  FINDINGS: Uterus  Measurements: 8.4 x 4.0 x 4.4 cm. No fibroids or other mass visualized.  Endometrium  Thickness: 8.6 mm. Fluid and echogenic debris in the lower uterine segment endometrial canal not significantly change when compared to prior ultrasound. This could be clot, submucosal fibroid or endometrial polyp. Hysteroscopy may be helpful for further evaluation.  Right ovary  Measurements: 5.3 x 2.3 x 3.2 cm. Multiple small follicles and a 1.3 cm cyst. Septated paraovarian cyst versus hydrosalpinx.   Left ovary  Measurements: 5.0 x 2.5 x 4.8 cm. Multiple small follicles. Normal appearance/no adnexal mass.  Other findings  Small amount of free pelvic fluid.  IMPRESSION: 1. Fluid and echogenic debris in the lower uterine segment endometrial canal not significantly change when compared to prior ultrasound. This could be a echogenic clot, submucosal fibroid or endometrial polyp. Hysteroscopy may be helpful for further evaluation. 2. Septated paraovarian cyst versus hydrosalpinx on the right.   Electronically Signed   By: Kalman Jewels M.D.   On: 05/29/2014 12:03     EKG Interpretation   Date/Time:  Tuesday May 29 2014 10:11:31 EDT Ventricular Rate:  91 PR Interval:  159 QRS Duration: 83 QT Interval:  368 QTC Calculation: 453 R Axis:   53 Text Interpretation:  Sinus rhythm Confirmed by ZACKOWSKI  MD, [place]  ([id number]) on 05/29/2014 10:28:57 AM      MDM   Final diagnoses:  CAP (community acquired pneumonia)  Cyst of ovary, unspecified laterality    Vital stable. In no respiratory distress. Will treat pneumonia with antibiotics. Pt instructed on smoking cessation. There is no change in cyst size pt given zithromax and percocet. Pt told to follow up with women's clinic as they have followed her    Glendell Docker, NP 05/29/14 1352

## 2014-05-29 NOTE — ED Notes (Signed)
ED-P in room at this time. Pt placed in gown, on stretcher, EKG done.

## 2014-05-30 ENCOUNTER — Emergency Department (HOSPITAL_COMMUNITY)
Admission: EM | Admit: 2014-05-30 | Discharge: 2014-05-31 | Disposition: A | Discharge status: Home / Self Care | Payer: Self-pay | Attending: Emergency Medicine | Admitting: Emergency Medicine

## 2014-05-30 ENCOUNTER — Encounter (HOSPITAL_COMMUNITY): Payer: Self-pay | Admitting: Emergency Medicine

## 2014-05-30 ENCOUNTER — Emergency Department (HOSPITAL_COMMUNITY): Payer: Self-pay

## 2014-05-30 DIAGNOSIS — Z792 Long term (current) use of antibiotics: Secondary | ICD-10-CM | POA: Insufficient documentation

## 2014-05-30 DIAGNOSIS — R079 Chest pain, unspecified: Secondary | ICD-10-CM | POA: Insufficient documentation

## 2014-05-30 DIAGNOSIS — Z8659 Personal history of other mental and behavioral disorders: Secondary | ICD-10-CM | POA: Insufficient documentation

## 2014-05-30 DIAGNOSIS — F17200 Nicotine dependence, unspecified, uncomplicated: Secondary | ICD-10-CM | POA: Insufficient documentation

## 2014-05-30 DIAGNOSIS — J189 Pneumonia, unspecified organism: Secondary | ICD-10-CM

## 2014-05-30 DIAGNOSIS — J159 Unspecified bacterial pneumonia: Secondary | ICD-10-CM | POA: Insufficient documentation

## 2014-05-30 DIAGNOSIS — R000 Tachycardia, unspecified: Secondary | ICD-10-CM | POA: Insufficient documentation

## 2014-05-30 DIAGNOSIS — R0602 Shortness of breath: Secondary | ICD-10-CM | POA: Insufficient documentation

## 2014-05-30 DIAGNOSIS — Z791 Long term (current) use of non-steroidal anti-inflammatories (NSAID): Secondary | ICD-10-CM | POA: Insufficient documentation

## 2014-05-30 DIAGNOSIS — Z79899 Other long term (current) drug therapy: Secondary | ICD-10-CM | POA: Insufficient documentation

## 2014-05-30 DIAGNOSIS — Z8619 Personal history of other infectious and parasitic diseases: Secondary | ICD-10-CM | POA: Insufficient documentation

## 2014-05-30 DIAGNOSIS — Z8742 Personal history of other diseases of the female genital tract: Secondary | ICD-10-CM | POA: Insufficient documentation

## 2014-05-30 LAB — GC/CHLAMYDIA PROBE AMP
CT PROBE, AMP APTIMA: NEGATIVE
GC Probe RNA: NEGATIVE

## 2014-05-30 MED ORDER — IOHEXOL 350 MG/ML SOLN: 100.0000 mL | Freq: Once | INTRAVENOUS | Status: AC | PRN

## 2014-05-30 MED ORDER — IPRATROPIUM BROMIDE 0.02 % IN SOLN: 0.5000 mg | Freq: Once | RESPIRATORY_TRACT | Status: DC

## 2014-05-30 MED ORDER — ALBUTEROL SULFATE (2.5 MG/3ML) 0.083% IN NEBU
5.0000 mg | INHALATION_SOLUTION | Freq: Once | RESPIRATORY_TRACT | Status: AC
  Administered 2014-05-30: 5 mg via RESPIRATORY_TRACT
  Filled 2014-05-30: qty 6

## 2014-05-30 MED ORDER — ALBUTEROL SULFATE (2.5 MG/3ML) 0.083% IN NEBU: 2.5000 mg | INHALATION_SOLUTION | Freq: Once | RESPIRATORY_TRACT | Status: DC

## 2014-05-30 MED ORDER — IPRATROPIUM-ALBUTEROL 0.5-2.5 (3) MG/3ML IN SOLN
3.0000 mL | Freq: Once | RESPIRATORY_TRACT | Status: AC
  Administered 2014-05-30: 3 mL via RESPIRATORY_TRACT
  Filled 2014-05-30: qty 3

## 2014-05-30 NOTE — ED Provider Notes (Signed)
CSN: [id number]     Arrival date & time 05/30/14  2245 History   This chart was scribed for Amber Acosta, MD by Lowella Petties, ED Scribe. The patient was seen in room APA19/APA19. Patient's care was started at 11:28 PM.   Chief Complaint  Patient presents with  . Shortness of Breath   The history is provided by the patient. No language interpreter was used.   HPI Comments: Amber Ray is a 23 y.o. female who presents to the Emergency Department complaining of SOB and intermittent, sharp, chest pain onset yesterday for which she was seen here and diagnosed with a possible pneumonia in the left lower lung after a CXR. She states that she was not able to fill the prescription for the ABX she was prescribed. She reports a cough productive of non-bloody phlegm. She reports a history of bronchitis two years ago. She denies any surgery, car trips, or plain trips. She denies any history of heart problems, recent surgery, CA.  She had a syncopal episode prior to arrival where she became more SOB and became very "hot". She reports a history of a ovarian cyst (seen yesterday on [place]). She is a smoker. She denies using birth control.   Past Medical History  Diagnosis Date  . Anxiety   . Chlamydia   . Gonorrhea   . Depression   . Bipolar affective disorder, depressed, mild   . 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 Dermoid cyst    Past Surgical History  Procedure Laterality Date  . No past surgeries    . Laparoscopy  07/27/2012    Procedure: LAPAROSCOPY OPERATIVE;  Surgeon: Mora Bellman, MD;  Location: [facility];  Service: Gynecology;  Laterality: N/A;  . Ovarian cyst removal  07/27/2012    Procedure: OVARIAN CYSTECTOMY;  Surgeon: Mora Bellman, MD;  Location: [facility];  Service: Gynecology;  Laterality: Right;  Dermoid Cyst   Family History  Problem Relation Age of Onset  . Diabetes Maternal Grandmother   . Arthritis Maternal Grandmother   . Arthritis Maternal Grandfather    . Diabetes Paternal Grandmother   . Hypertension Paternal Grandmother   . Arthritis Paternal Grandmother   . Kidney disease Paternal Grandmother   . Arthritis Paternal Grandfather    History  Substance Use Topics  . Smoking status: Current Every Day Smoker -- 0.50 packs/day for 2 years    Types: Cigarettes  . Smokeless tobacco: Never Used  . Alcohol Use: 0.5 oz/week    1 drink(s) per week     Comment: occasionally   OB History   Grav Para Term Preterm Abortions TAB SAB Ect Mult Living   0    0  0   0     Review of Systems  All other systems reviewed and are negative.     Allergies  Review of patient's allergies indicates no known allergies.  Home Medications   Prior to Admission medications   Medication Sig Start Date End Date Taking? Authorizing Provider  azithromycin (ZITHROMAX) 250 MG tablet Take 1 tablet (250 mg total) by mouth daily. Take first 2 tablets together, then 1 every day until finished. 05/29/14   Glendell Docker, NP  benzocaine (ORAJEL) 10 % mucosal gel Use as directed 1 application in the mouth or throat as needed for mouth pain.    Historical Provider, MD  ibuprofen (ADVIL,MOTRIN) 200 MG tablet Take 600 mg by mouth every 6 (six) hours  as needed for pain.     Historical Provider, MD  metFORMIN (GLUCOPHAGE) 850 MG tablet Take 1 tablet (850 mg total) by mouth 2 (two) times daily with a meal. 08/21/13   Osborne Oman, MD  naproxen (NAPROSYN) 500 MG tablet Take 1 tablet (500 mg total) by mouth 2 (two) times daily with a meal. 05/31/14   Amber Acosta, MD  oxyCODONE-acetaminophen (PERCOCET/ROXICET) 5-325 MG per tablet Take 1-2 tablets by mouth every 8 (eight) hours as needed for moderate pain or severe pain. 05/29/14   Glendell Docker, NP  tetrahydrozoline 0.05 % ophthalmic solution Place 2 drops into both eyes daily as needed (irriation).    Historical Provider, MD   Triage Vitals: BP 151/109  Pulse 118  Temp(Src) 98.5 F (36.9 C) (Oral)  Resp 24  Ht 5'  4" (1.626 m)  Wt 190 lb (86.183 kg)  BMI 32.60 kg/m2  SpO2 100%  LMP 05/10/2014 Physical Exam  Nursing note and vitals reviewed. Constitutional: She is oriented to person, place, and time. She appears well-developed and well-nourished. No distress.  HENT:  Head: Normocephalic and atraumatic.  Eyes: Conjunctivae and EOM are normal.  Neck: Neck supple. No tracheal deviation present.  Cardiovascular: Tachycardia present.   Tachypnea  Pulmonary/Chest: Effort normal. No respiratory distress.  Musculoskeletal: Normal range of motion.  Neurological: She is alert and oriented to person, place, and time.  Skin: Skin is warm and dry.  Psychiatric: She has a normal mood and affect. Her behavior is normal.    ED Course  Procedures (including critical care time) DIAGNOSTIC STUDIES: Oxygen Saturation is 100% on room air, normal by my interpretation.    COORDINATION OF CARE: 11:32 PM-Discussed treatment plan which includes CT-Scan with pt at bedside and pt agreed to plan.    Labs Review Labs Reviewed  CBC WITH DIFFERENTIAL - Abnormal; Notable for the following:    WBC 11.0 (*)    All other components within normal limits  BASIC METABOLIC PANEL - Abnormal; Notable for the following:    Potassium 3.5 (*)    Glucose, Bld 125 (*)    BUN 5 (*)    All other components within normal limits  PRO B NATRIURETIC PEPTIDE  LACTIC ACID, PLASMA    Imaging Review  CT Angio Chest PE W/Cm &/Or Wo Cm (Final result)  Result time: 05/31/14 00:55:23    Final result by Rad Results In Interface (05/31/14 00:55:23)    Narrative:   CLINICAL DATA: Shortness of breath with intermittent sharp chest pain and productive cough. Question pulmonary embolism.  EXAM: CT ANGIOGRAPHY CHEST WITH CONTRAST  TECHNIQUE: Multidetector CT imaging of the chest was performed using the standard protocol during bolus administration of intravenous contrast. Multiplanar CT image reconstructions and MIPs were obtained to  evaluate the vascular anatomy.  CONTRAST: 138mL OMNIPAQUE IOHEXOL 350 MG/ML SOLN  COMPARISON: Radiographs 05/29/2014. Abdominal CT 07/30/2012.  FINDINGS: Vascular: The pulmonary arteries are well opacified with contrast. There is no evidence of acute pulmonary embolism. The aorta and great vessels appear unremarkable.  Mediastinum: There are no enlarged mediastinal, hilar or axillary lymph nodes. The thyroid gland, trachea and esophagus appear normal. The heart size is normal.  Lungs/Pleura: There is no pleural or pericardial effusion.There are patchy predominate dependent airspace and ground-glass densities in both lungs. There is no consolidation or focal nodule.  Upper abdomen: Unremarkable. There is no adrenal mass.  Musculoskeletal/Chest wall: No chest wall lesion or acute osseous findings.  Review of the MIP images confirms  the above findings.  IMPRESSION: 1. No evidence of acute pulmonary embolism. 2. Diffuse pulmonary ground-glass and patchy airspace opacities without consolidation. As suggested on previous radiographs, this could reflect atypical infection or edema. 3. No pleural effusion or adenopathy demonstrated.   Electronically Signed By: Camie Patience M.D. On: 05/31/2014 00:55      EKG Interpretation   Date/Time:  Thursday May 31 2014 01:29:56 EDT Ventricular Rate:  103 PR Interval:  166 QRS Duration: 84 QT Interval:  346 QTC Calculation: 453 R Axis:   50 Text Interpretation:  Sinus tachycardia Borderline ECG compared with ECG  from yesterday, no sig changes Confirmed by Sabra Heck  MD, Yeraldine Forney ([id number]) on  05/31/2014 1:35:13 AM      MDM   Final diagnoses:  CAP (community acquired pneumonia)  Chest pain, unspecified chest pain type    Initially the patient was tachycardic and tachypneic, she received 2 nebulized treatments, she states that neither of those treatments improved her symptoms. She has a persistent mild cough in the room. She appears  anxious, she has increased work of breathing. CT scan ordered to evaluate for pulmonary Embolism.  CT scan shows diffuse ground glass opacities, this is not consistent with a pulmonary embolism, possibly infectious, possibly edema. The patient does not have JVD, she does not have peripheral swelling, she is now sleeping in her tachycardia and tachypnea have temporarily improved. We'll obtain laboratory data to further evaluate the source of the abnormal CT findings.  Filed Vitals:   05/31/14 0030 05/31/14 0100 05/31/14 0127 05/31/14 0130  BP: 117/74 117/82  122/76  Pulse: 100 96 100 100  Temp:      TempSrc:      Resp: 11 24 12 13   Height:      Weight:      SpO2: 97% 100% 99% 99%   On last recheck a few minutes prior to d/c, pt was well, sleeping, she has been ambulatory, toelrating oral meds and dosed with Rocephin prior to d/c.  Sx improved.  Labs normal and reassuring, does not meet SIRS criteria.  Temp 98.5  Meds given in ED:  Medications  iohexol (OMNIPAQUE) 350 MG/ML injection 100 mL (not administered)  dextrose 5 % with cefTRIAXone (ROCEPHIN) ADS Med (not administered)  azithromycin (ZITHROMAX) 250 MG tablet (not administered)  azithromycin (ZITHROMAX) tablet 250 mg (not administered)  ipratropium-albuterol (DUONEB) 0.5-2.5 (3) MG/3ML nebulizer solution 3 mL (3 mLs Nebulization Given 05/30/14 2301)  albuterol (PROVENTIL) (2.5 MG/3ML) 0.083% nebulizer solution 5 mg (5 mg Nebulization Given 05/30/14 2346)  iohexol (OMNIPAQUE) 350 MG/ML injection 100 mL (100 mLs Intravenous Contrast Given 05/31/14 0013)    New Prescriptions   NAPROXEN (NAPROSYN) 500 MG TABLET    Take 1 tablet (500 mg total) by mouth 2 (two) times daily with a meal.      I personally performed the services described in this documentation, which was scribed in my presence. The recorded information has been reviewed and is accurate.       Amber Acosta, MD 05/31/14 [phone]

## 2014-05-30 NOTE — ED Provider Notes (Signed)
Medical screening examination/treatment/procedure(s) were performed by non-physician practitioner and as supervising physician I was immediately available for consultation/collaboration.   EKG Interpretation   Date/Time:  Tuesday May 29 2014 10:11:31 EDT Ventricular Rate:  91 PR Interval:  159 QRS Duration: 83 QT Interval:  368 QTC Calculation: 453 R Axis:   53 Text Interpretation:  Sinus rhythm Confirmed by Rogene Houston  MD, Deajah Erkkila  ([id number]) on 05/29/2014 10:28:57 AM        Fredia Sorrow, MD 05/30/14 [phone]

## 2014-05-30 NOTE — ED Notes (Signed)
Pt reporting she was seen yesterday and diagnosed with pneumonia.  States she has not been able to start prescriptions.  Pt reports tonight she was "just sitting in the chair and all of a sudden I couldn't breath and got real hot."

## 2014-05-31 LAB — BASIC METABOLIC PANEL
ANION GAP: 10 (ref 5–15)
BUN: 5 mg/dL — ABNORMAL LOW (ref 6–23)
CALCIUM: 8.4 mg/dL (ref 8.4–10.5)
CHLORIDE: 104 meq/L (ref 96–112)
CO2: 25 meq/L (ref 19–32)
Creatinine, Ser: 0.68 mg/dL (ref 0.50–1.10)
GFR calc Af Amer: >90 mL/min (ref >90)
GFR calc non Af Amer: >90 mL/min (ref >90)
Glucose, Bld: 125 mg/dL — ABNORMAL HIGH (ref 70–99)
Potassium: 3.5 mEq/L — ABNORMAL LOW (ref 3.7–5.3)
Sodium: 139 mEq/L (ref 137–147)

## 2014-05-31 LAB — CBC WITH DIFFERENTIAL/PLATELET
Basophils Absolute: 0 10*3/uL (ref 0.0–0.1)
Basophils Relative: 0 % (ref 0–1)
Eosinophils Absolute: 0.3 10*3/uL (ref 0.0–0.7)
Eosinophils Relative: 3 % (ref 0–5)
HCT: 39.9 % (ref 36.0–46.0)
Hemoglobin: 13.4 g/dL (ref 12.0–15.0)
LYMPHS ABS: 3.7 10*3/uL (ref 0.7–4.0)
LYMPHS PCT: 34 % (ref 12–46)
MCH: 28.9 pg (ref 26.0–34.0)
MCHC: 33.6 g/dL (ref 30.0–36.0)
MCV: 86 fL (ref 78.0–100.0)
Monocytes Absolute: 0.8 10*3/uL (ref 0.1–1.0)
Monocytes Relative: 7 % (ref 3–12)
NEUTROS PCT: 56 % (ref 43–77)
Neutro Abs: 6.2 10*3/uL (ref 1.7–7.7)
Platelets: 202 10*3/uL (ref 150–400)
RBC: 4.64 MIL/uL (ref 3.87–5.11)
RDW: 12.6 % (ref 11.5–15.5)
WBC: 11 10*3/uL — AB (ref 4.0–10.5)

## 2014-05-31 LAB — LACTIC ACID, PLASMA: LACTIC ACID, VENOUS: 1.3 mmol/L (ref 0.5–2.2)

## 2014-05-31 LAB — PRO B NATRIURETIC PEPTIDE: Pro B Natriuretic peptide (BNP): 20 pg/mL (ref 0–125)

## 2014-05-31 MED ORDER — DEXTROSE 5 % IV SOLN
INTRAVENOUS | Status: AC
  Administered 2014-05-31: 02:00:00
  Filled 2014-05-31: qty 10

## 2014-05-31 MED ORDER — AZITHROMYCIN 250 MG PO TABS
250.0000 mg | ORAL_TABLET | Freq: Once | ORAL | Status: AC
  Administered 2014-05-31: 500 mg via ORAL

## 2014-05-31 MED ORDER — AZITHROMYCIN 250 MG PO TABS
ORAL_TABLET | ORAL | Status: AC
  Administered 2014-05-31: 500 mg via ORAL
  Filled 2014-05-31: qty 2

## 2014-05-31 MED ORDER — NAPROXEN 500 MG PO TABS: 500.0000 mg | ORAL_TABLET | Freq: Two times a day (BID) | ORAL | Status: DC

## 2014-05-31 MED ORDER — IOHEXOL 350 MG/ML SOLN
100.0000 mL | Freq: Once | INTRAVENOUS | Status: AC | PRN
  Administered 2014-05-31: 100 mL via INTRAVENOUS

## 2014-05-31 NOTE — Discharge Instructions (Signed)
Your CT scan shows that you have pneumonia - you do NOT have a blood clot or heart problems - take 2 days of rest - take your antibiotics exactly as prescribed.  Please call your doctor for a followup appointment within 24-48 hours. When you talk to your doctor please let them know that you were seen in the emergency department and have them acquire all of your records so that they can discuss the findings with you and formulate a treatment plan to fully care for your new and ongoing problems.  Naprosyn or Ibuprofen for pain  Zithromax daily for next 4 days.

## 2014-06-27 ENCOUNTER — Ambulatory Visit (HOSPITAL_COMMUNITY): Admission: RE | Admit: 2014-06-27 | Payer: Self-pay | Source: Ambulatory Visit

## 2014-06-27 ENCOUNTER — Other Ambulatory Visit (HOSPITAL_COMMUNITY): Payer: Self-pay | Admitting: Advanced Practice Midwife

## 2014-06-27 ENCOUNTER — Ambulatory Visit (HOSPITAL_COMMUNITY)
Admission: RE | Admit: 2014-06-27 | Discharge: 2014-06-27 | Disposition: A | Discharge status: Home / Self Care | Payer: Self-pay | Source: Ambulatory Visit | Attending: Advanced Practice Midwife | Admitting: Advanced Practice Midwife

## 2014-06-27 DIAGNOSIS — N8300 Follicular cyst of ovary, unspecified side: Secondary | ICD-10-CM

## 2014-06-28 ENCOUNTER — Telehealth: Payer: Self-pay | Admitting: General Practice

## 2014-06-28 NOTE — Telephone Encounter (Signed)
Called patient and informed her of ultrasound showing a small ovarian cyst. Patient states this cyst has been there for over a month now and it's causing pain and she still isn't getting a period and the soonest appt we could give her was at the end of September. Recommended to patient she call us in the morning each day to see if we have had a cancellation for the day. Patient verbalized understanding and had no other questions

## 2014-06-28 NOTE — Telephone Encounter (Signed)
Patient called and left message stating she would like her ultrasound results from yesterday. She had an ultrasound at [facility] and she was told to call here for her results.

## 2014-07-30 ENCOUNTER — Encounter: Payer: Self-pay | Admitting: Obstetrics & Gynecology

## 2014-07-30 ENCOUNTER — Ambulatory Visit (INDEPENDENT_AMBULATORY_CARE_PROVIDER_SITE_OTHER): Payer: Self-pay | Admitting: Obstetrics & Gynecology

## 2014-07-30 VITALS — BP 106/73 | HR 88 | Temp 97.6°F | Ht 64.0 in | Wt 196.8 lb

## 2014-07-30 DIAGNOSIS — E282 Polycystic ovarian syndrome: Secondary | ICD-10-CM

## 2014-07-30 DIAGNOSIS — N979 Female infertility, unspecified: Secondary | ICD-10-CM

## 2014-07-30 MED ORDER — METFORMIN HCL 1000 MG PO TABS: 850.0000 mg | ORAL_TABLET | Freq: Two times a day (BID) | ORAL | Status: DC

## 2014-07-30 MED ORDER — MEDROXYPROGESTERONE ACETATE 10 MG PO TABS: 10.0000 mg | ORAL_TABLET | Freq: Every day | ORAL | Status: DC

## 2014-07-30 NOTE — Patient Instructions (Signed)
Astroglide and/or [place] jelly  [email or web address]

## 2014-07-30 NOTE — Progress Notes (Signed)
Subjective:     Patient ID: Amber Ray, female   DOB: [birth year], 23 y.o.   MRN: [id number]  HPI Pt presents to f/u on PCOS.  She reports that she has not had a cycles for several months.  She is not currently sexually active so she reports that she cannot be sexually active.  She reports that despite exercise and eating right.   She complains of some vaginal dryness occ.  Review of Systems     Objective:   Physical Exam BP 106/73  Pulse 88  Temp(Src) 97.6 F (36.4 C) (Oral)  Ht 5\' 4"  (1.626 m)  Wt 196 lb 12.8 oz (89.268 kg)  BMI 33.76 kg/m2  LMP 05/10/2014 Exam deferred  05/29/2014 EXAM:  TRANSABDOMINAL AND TRANSVAGINAL ULTRASOUND OF PELVIS  TECHNIQUE:  Both transabdominal and transvaginal ultrasound examinations of the  pelvis were performed. Transabdominal technique was performed for  global imaging of the pelvis including uterus, ovaries, adnexal  regions, and pelvic cul-de-sac. It was necessary to proceed with  endovaginal exam following the transabdominal exam to visualize the  ovaries and endometrium.  COMPARISON: 03/21/2014  FINDINGS:  Uterus  Measurements: 8.4 x 4.0 x 4.4 cm. No fibroids or other mass  visualized.  Endometrium  Thickness: 8.6 mm. Fluid and echogenic debris in the lower uterine  segment endometrial canal not significantly change when compared to  prior ultrasound. This could be clot, submucosal fibroid or  endometrial polyp. Hysteroscopy may be helpful for further  evaluation.  Right ovary  Measurements: 5.3 x 2.3 x 3.2 cm. Multiple small follicles and a 1.3  cm cyst. Septated paraovarian cyst versus hydrosalpinx.  Left ovary  Measurements: 5.0 x 2.5 x 4.8 cm. Multiple small follicles. Normal  appearance/no adnexal mass.  Other findings  Small amount of free pelvic fluid.  IMPRESSION:  1. Fluid and echogenic debris in the lower uterine segment  endometrial canal not significantly change when compared to prior  ultrasound. This could be a  echogenic clot, submucosal fibroid or  endometrial polyp. Hysteroscopy may be helpful for further  evaluation.  2. Septated paraovarian cyst versus hydrosalpinx on the right    06/27/2014 EXAM:  TRANSABDOMINAL AND TRANSVAGINAL ULTRASOUND OF PELVIS  TECHNIQUE:  Both transabdominal and transvaginal ultrasound examinations of the  pelvis were performed. Transabdominal technique was performed for  global imaging of the pelvis including uterus, ovaries, adnexal  regions, and pelvic cul-de-sac. It was necessary to proceed with  endovaginal exam following the transabdominal exam to visualize the  uterus, endometrium, ovaries and adnexal regions.  COMPARISON: 05/29/2014.  FINDINGS:  Uterus  Measurements: 8.2 x 3.3 x 4.9 cm. No fibroids or other mass  visualized.  Endometrium  Thickness: 11 mm. There is complex fluid/debris in the lower uterine  segment.  Right ovary  Measurements: 5.5 x 2.7 x 3.2 cm. A rounded anechoic structure with  increased through transmission is seen adjacent to the right ovary,  measuring 1.3 x 1.2 x 1.4 cm, similar to 05/29/2014. A tubular  morphology is not readily identified on today's exam.  Left ovary  Measurements: 3.6 x 3.8 x 3.0 cm. Normal appearance/no adnexal mass.  Other findings  No free fluid.  IMPRESSION:  1. Complex fluid/debris in the lower uterine segment, as on recent  prior exams. Sonohysterography may be helpful in further evaluation,  as clinically indicated.  2. Simple appearing anechoic structure adjacent to the right ovary  may represent a parovarian cyst. A tubular morphology is not  demonstrated on today's study.  Assessment:     PCOS-  Sx stable Obesity- referred pt to [employer].   Reviewed with pt the need to have cycles 4 times per year minimum     Plan:     Metformin 1000mg  bid Provera 10mg  po q day x 7 days F/u 6 months Pt to call if she does not have a withdrawal bleed

## 2014-07-31 ENCOUNTER — Telehealth: Payer: Self-pay

## 2014-07-31 DIAGNOSIS — E282 Polycystic ovarian syndrome: Secondary | ICD-10-CM

## 2014-07-31 DIAGNOSIS — N979 Female infertility, unspecified: Secondary | ICD-10-CM

## 2014-07-31 MED ORDER — METFORMIN HCL 1000 MG PO TABS: 1000.0000 mg | ORAL_TABLET | Freq: Two times a day (BID) | ORAL | Status: DC

## 2014-07-31 NOTE — Telephone Encounter (Signed)
Patient called stating she was seen yesterday and RX for Metformin was supposed to be sent to pharmacy but was not. Per Chart review-- medication printed. Reordered to e-prescribe. Called patient and informed her she should not have a problem obtaining RX. Patient verbalized understanding and gratitude. No further questions or concerns.

## 2014-08-05 ENCOUNTER — Emergency Department (HOSPITAL_COMMUNITY)
Admission: EM | Admit: 2014-08-05 | Discharge: 2014-08-06 | Disposition: A | Discharge status: Home / Self Care | Payer: Self-pay | Attending: Emergency Medicine | Admitting: Emergency Medicine

## 2014-08-05 ENCOUNTER — Emergency Department (HOSPITAL_COMMUNITY): Payer: Self-pay

## 2014-08-05 ENCOUNTER — Encounter (HOSPITAL_COMMUNITY): Payer: Self-pay | Admitting: Emergency Medicine

## 2014-08-05 DIAGNOSIS — F17200 Nicotine dependence, unspecified, uncomplicated: Secondary | ICD-10-CM | POA: Insufficient documentation

## 2014-08-05 DIAGNOSIS — K429 Umbilical hernia without obstruction or gangrene: Secondary | ICD-10-CM | POA: Insufficient documentation

## 2014-08-05 DIAGNOSIS — F411 Generalized anxiety disorder: Secondary | ICD-10-CM | POA: Insufficient documentation

## 2014-08-05 DIAGNOSIS — Z8742 Personal history of other diseases of the female genital tract: Secondary | ICD-10-CM | POA: Insufficient documentation

## 2014-08-05 DIAGNOSIS — Z8619 Personal history of other infectious and parasitic diseases: Secondary | ICD-10-CM | POA: Insufficient documentation

## 2014-08-05 DIAGNOSIS — Z79899 Other long term (current) drug therapy: Secondary | ICD-10-CM | POA: Insufficient documentation

## 2014-08-05 DIAGNOSIS — Z791 Long term (current) use of non-steroidal anti-inflammatories (NSAID): Secondary | ICD-10-CM | POA: Insufficient documentation

## 2014-08-05 DIAGNOSIS — Z9889 Other specified postprocedural states: Secondary | ICD-10-CM | POA: Insufficient documentation

## 2014-08-05 DIAGNOSIS — F3011 Manic episode without psychotic symptoms, mild: Secondary | ICD-10-CM | POA: Insufficient documentation

## 2014-08-05 DIAGNOSIS — R1033 Periumbilical pain: Secondary | ICD-10-CM | POA: Insufficient documentation

## 2014-08-05 DIAGNOSIS — R112 Nausea with vomiting, unspecified: Secondary | ICD-10-CM | POA: Insufficient documentation

## 2014-08-05 LAB — CBC WITH DIFFERENTIAL/PLATELET
BASOS ABS: 0.1 10*3/uL (ref 0.0–0.1)
Basophils Relative: 1 % (ref 0–1)
Eosinophils Absolute: 0.5 10*3/uL (ref 0.0–0.7)
Eosinophils Relative: 3 % (ref 0–5)
HEMATOCRIT: 42.8 % (ref 36.0–46.0)
Hemoglobin: 14.5 g/dL (ref 12.0–15.0)
LYMPHS PCT: 30 % (ref 12–46)
Lymphs Abs: 4.5 10*3/uL — ABNORMAL HIGH (ref 0.7–4.0)
MCH: 28.9 pg (ref 26.0–34.0)
MCHC: 33.9 g/dL (ref 30.0–36.0)
MCV: 85.3 fL (ref 78.0–100.0)
MONO ABS: 1 10*3/uL (ref 0.1–1.0)
Monocytes Relative: 6 % (ref 3–12)
NEUTROS ABS: 9.3 10*3/uL — AB (ref 1.7–7.7)
Neutrophils Relative %: 60 % (ref 43–77)
Platelets: 226 10*3/uL (ref 150–400)
RBC: 5.02 MIL/uL (ref 3.87–5.11)
RDW: 13.1 % (ref 11.5–15.5)
WBC: 15.3 10*3/uL — ABNORMAL HIGH (ref 4.0–10.5)

## 2014-08-05 LAB — URINALYSIS, ROUTINE W REFLEX MICROSCOPIC
Bilirubin Urine: NEGATIVE
Glucose, UA: NEGATIVE mg/dL
Hgb urine dipstick: NEGATIVE
Ketones, ur: NEGATIVE mg/dL
Leukocytes, UA: NEGATIVE
NITRITE: NEGATIVE
Protein, ur: NEGATIVE mg/dL
Specific Gravity, Urine: 1.02 (ref 1.005–1.030)
UROBILINOGEN UA: 0.2 mg/dL (ref 0.0–1.0)
pH: 6 (ref 5.0–8.0)

## 2014-08-05 LAB — BASIC METABOLIC PANEL
ANION GAP: 10 (ref 5–15)
BUN: 9 mg/dL (ref 6–23)
CO2: 27 meq/L (ref 19–32)
CREATININE: 0.8 mg/dL (ref 0.50–1.10)
Calcium: 9.3 mg/dL (ref 8.4–10.5)
Chloride: 98 mEq/L (ref 96–112)
GFR calc Af Amer: >90 mL/min (ref >90)
GFR calc non Af Amer: >90 mL/min (ref >90)
Glucose, Bld: 93 mg/dL (ref 70–99)
POTASSIUM: 4.1 meq/L (ref 3.7–5.3)
SODIUM: 135 meq/L — AB (ref 137–147)

## 2014-08-05 LAB — LIPASE, BLOOD: LIPASE: 27 U/L (ref 11–59)

## 2014-08-05 LAB — PREGNANCY, URINE: PREG TEST UR: NEGATIVE

## 2014-08-05 MED ORDER — FENTANYL CITRATE 0.05 MG/ML IJ SOLN
INTRAMUSCULAR | Status: AC
  Administered 2014-08-05: 100 ug via INTRAVENOUS
  Filled 2014-08-05: qty 2

## 2014-08-05 MED ORDER — ONDANSETRON HCL 4 MG/2ML IJ SOLN
4.0000 mg | Freq: Once | INTRAMUSCULAR | Status: AC
  Administered 2014-08-05: 4 mg via INTRAVENOUS
  Filled 2014-08-05: qty 2

## 2014-08-05 MED ORDER — FENTANYL CITRATE 0.05 MG/ML IJ SOLN
100.0000 ug | Freq: Once | INTRAMUSCULAR | Status: AC
  Administered 2014-08-05: 100 ug via INTRAVENOUS

## 2014-08-05 NOTE — ED Notes (Signed)
Pt vomiting last night and with N/V today, pt states some swelling at bellybutton area since Friday, LNBM today per pt

## 2014-08-05 NOTE — ED Notes (Addendum)
Pt has pain and swelling to umbilical area, lower abdominal pain and nausea x 2 days.

## 2014-08-05 NOTE — ED Provider Notes (Signed)
CSN: [id number]     Arrival date & time 08/05/14  2044 History   This chart was scribed for Wynetta Fines, MD, by Neta Ehlers, ED Scribe. This patient was seen in room APA05/APA05 and the patient's care was started at 11:10 PM.  None    Chief Complaint  Patient presents with  . Abdominal Pain     HPI HPI Comments: Amber Ray is a 23 y.o. female, with a h/o chlamydia, gonorrhea, and an ovarian cyst, who presents to the Emergency Department complaining of severe periumbilical pain which began 48 hours ago and which she rates as 8.5/10. She states there is a knot around her umbilicus. She endorses vomiting which began yesterday evening. She denies diarrhea, constipation, vaginal discharge, vaginal bleeding, or a fever; in the ED her temperature is 99 F.   Past Medical History  Diagnosis Date  . Anxiety   . Chlamydia   . Gonorrhea   . Depression   . Bipolar affective disorder, depressed, mild   . 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 Dermoid cyst    Past Surgical History  Procedure Laterality Date  . Laparoscopy  07/27/2012    Procedure: LAPAROSCOPY OPERATIVE;  Surgeon: Mora Bellman, MD;  Location: [facility];  Service: Gynecology;  Laterality: N/A;  . Ovarian cyst removal  07/27/2012    Procedure: OVARIAN CYSTECTOMY;  Surgeon: Mora Bellman, MD;  Location: [facility];  Service: Gynecology;  Laterality: Right;  Dermoid Cyst   Family History  Problem Relation Age of Onset  . Diabetes Maternal Grandmother   . Arthritis Maternal Grandmother   . Arthritis Maternal Grandfather   . Diabetes Paternal Grandmother   . Hypertension Paternal Grandmother   . Arthritis Paternal Grandmother   . Kidney disease Paternal Grandmother   . Arthritis Paternal Grandfather    History  Substance Use Topics  . Smoking status: Current Every Day Smoker -- 0.50 packs/day for 2 years    Types: Cigarettes  . Smokeless tobacco: Never Used  . Alcohol Use: 0.5 oz/week    1 drink(s) per  week     Comment: occasionally   OB History   Grav Para Term Preterm Abortions TAB SAB Ect Mult Living   0    0  0   0     Review of Systems  A complete 10 system review of systems was obtained, and all systems were negative except where indicated in the HPI and PE.    Allergies  Review of patient's allergies indicates no known allergies.  Home Medications   Prior to Admission medications   Medication Sig Start Date End Date Taking? Authorizing Provider  metFORMIN (GLUCOPHAGE) 1000 MG tablet Take 1 tablet (1,000 mg total) by mouth 2 (two) times daily with a meal. 07/31/14  Yes Lavonia Drafts, MD  Multiple Vitamin (MULTIVITAMIN WITH MINERALS) TABS tablet Take 1 tablet by mouth daily.   Yes Historical Provider, MD  naproxen (NAPROSYN) 500 MG tablet Take 500 mg by mouth 2 (two) times daily as needed. 05/31/14  Yes [name], MD  traZODone (DESYREL) 50 MG tablet Take 50 mg by mouth at bedtime as needed for sleep.   Yes Historical Provider, MD   Triage Vitas: BP 130/75  Pulse 96  Temp(Src) 99 F (37.2 C) (Oral)  Resp 24  Ht 5\' 4"  (1.626 m)  Wt 196 lb (88.905 kg)  BMI 33.63 kg/m2  SpO2 100%  LMP 05/10/2014  Physical Exam  General: Well-developed, well-nourished female in no acute distress; appearance consistent with age of record HENT: normocephalic; atraumatic Eyes: pupils equal, round and reactive to light; extraocular muscles intact Neck: supple Heart: regular rate and rhythm; no murmurs, rubs or gallops Lungs: clear to auscultation bilaterally Abdomen: periumbilical tenderness; soft; nondistended;  no masses or hepatosplenomegaly; no hernia palpated; bowel sounds present Extremities: No deformity; full range of motion; pulses normal Neurologic: Awake, alert and oriented; motor function intact in all extremities and symmetric; no facial droop Skin: Warm and dry Psychiatric: Normal mood and affect   ED Course  Procedures (including critical care  time)  DIAGNOSTIC STUDIES: Oxygen Saturation is 100% on room air, normal by my interpretation.    COORDINATION OF CARE:  11:14 PM- Discussed treatment plan with patient, and the patient agreed to the plan. The plan includes a CT scan and pain medication.    MDM   Nursing notes and vitals signs, including pulse oximetry, reviewed.  Summary of this visit's results, reviewed by myself:  Labs:  Results for orders placed during the hospital encounter of 08/05/14 (from the past 24 hour(s))  LIPASE, BLOOD     Status: None   Collection Time    08/05/14 10:00 PM      Result Value Ref Range   Lipase 27  11 - 59 U/L  CBC WITH DIFFERENTIAL     Status: Abnormal   Collection Time    08/05/14 10:39 PM      Result Value Ref Range   WBC 15.3 (*) 4.0 - 10.5 K/uL   RBC 5.02  3.87 - 5.11 MIL/uL   Hemoglobin 14.5  12.0 - 15.0 g/dL   HCT 42.8  36.0 - 46.0 %   MCV 85.3  78.0 - 100.0 fL   MCH 28.9  26.0 - 34.0 pg   MCHC 33.9  30.0 - 36.0 g/dL   RDW 13.1  11.5 - 15.5 %   Platelets 226  150 - 400 K/uL   Neutrophils Relative % 60  43 - 77 %   Neutro Abs 9.3 (*) 1.7 - 7.7 K/uL   Lymphocytes Relative 30  12 - 46 %   Lymphs Abs 4.5 (*) 0.7 - 4.0 K/uL   Monocytes Relative 6  3 - 12 %   Monocytes Absolute 1.0  0.1 - 1.0 K/uL   Eosinophils Relative 3  0 - 5 %   Eosinophils Absolute 0.5  0.0 - 0.7 K/uL   Basophils Relative 1  0 - 1 %   Basophils Absolute 0.1  0.0 - 0.1 K/uL  BASIC METABOLIC PANEL     Status: Abnormal   Collection Time    08/05/14 10:39 PM      Result Value Ref Range   Sodium 135 (*) 137 - 147 mEq/L   Potassium 4.1  3.7 - 5.3 mEq/L   Chloride 98  96 - 112 mEq/L   CO2 27  19 - 32 mEq/L   Glucose, Bld 93  70 - 99 mg/dL   BUN 9  6 - 23 mg/dL   Creatinine, Ser 0.80  0.50 - 1.10 mg/dL   Calcium 9.3  8.4 - 10.5 mg/dL   GFR calc non Af Amer >90  >90 mL/min   GFR calc Af Amer >90  >90 mL/min   Anion gap 10  5 - 15  PREGNANCY, URINE     Status: None   Collection Time    08/05/14  10:40 PM      Result Value  Ref Range   Preg Test, Ur NEGATIVE  NEGATIVE  URINALYSIS, ROUTINE W REFLEX MICROSCOPIC     Status: None   Collection Time    08/05/14 10:40 PM      Result Value Ref Range   Color, Urine YELLOW  YELLOW   APPearance CLEAR  CLEAR   Specific Gravity, Urine 1.020  1.005 - 1.030   pH 6.0  5.0 - 8.0   Glucose, UA NEGATIVE  NEGATIVE mg/dL   Hgb urine dipstick NEGATIVE  NEGATIVE   Bilirubin Urine NEGATIVE  NEGATIVE   Ketones, ur NEGATIVE  NEGATIVE mg/dL   Protein, ur NEGATIVE  NEGATIVE mg/dL   Urobilinogen, UA 0.2  0.0 - 1.0 mg/dL   Nitrite NEGATIVE  NEGATIVE   Leukocytes, UA NEGATIVE  NEGATIVE    Imaging Studies: Ct Abdomen Pelvis W Contrast  08/06/2014   CLINICAL DATA:  Lower abdominal pain for 2 days.  EXAM: CT ABDOMEN AND PELVIS WITH CONTRAST  TECHNIQUE: Multidetector CT imaging of the abdomen and pelvis was performed using the standard protocol following bolus administration of intravenous contrast.  CONTRAST:  131mL OMNIPAQUE IOHEXOL 300 MG/ML SOLN, 33mL OMNIPAQUE IOHEXOL 300 MG/ML SOLN  COMPARISON:  07/30/2012  FINDINGS: Minimal dependent changes in the lung bases.  The liver, spleen, gallbladder, pancreas, adrenal glands, kidneys, abdominal aorta, inferior vena cava, and retroperitoneal lymph nodes are unremarkable. Small umbilical hernia containing fat. Stomach, small bowel, and colon appear normal for degree of distention. No free air or free fluid in the abdomen.  Pelvis: Uterus and ovaries are not enlarged. No free or loculated pelvic fluid collections. No pelvic mass or lymphadenopathy. Bladder wall is not thickened. No evidence of diverticulitis. Appendix is normal. Normal alignment of the lumbar spine. No bone destruction.  IMPRESSION: No focal acute process demonstrated in the abdomen or pelvis that would explain the patient's history. Small umbilical hernia containing fat.   Electronically Signed   By: Lucienne Capers M.D.   On: 08/06/2014 00:17   12:26  AM Patient advised to CT findings. Umbilical hernia is small and contains only fat; this would not be the cause of a bowel obstruction and unlikely the cause of her acute vomiting. She may well have and acute viral GI illness. We would like to avoid long-term narcotic therapy but we'll treat her with a brief course.  I personally performed the services described in this documentation, which was scribed in my presence. The recorded information has been reviewed and is accurate.    Wynetta Fines, MD 08/06/14 [phone]

## 2014-08-06 MED ORDER — ONDANSETRON HCL 4 MG PO TABS: 4.0000 mg | ORAL_TABLET | Freq: Three times a day (TID) | ORAL | Status: DC | PRN

## 2014-08-06 MED ORDER — IOHEXOL 300 MG/ML  SOLN
50.0000 mL | Freq: Once | INTRAMUSCULAR | Status: AC | PRN
  Administered 2014-08-06: 50 mL via ORAL

## 2014-08-06 MED ORDER — HYDROCODONE-ACETAMINOPHEN 5-325 MG PO TABS: 1.0000 | ORAL_TABLET | Freq: Four times a day (QID) | ORAL | Status: DC | PRN

## 2014-08-06 MED ORDER — IOHEXOL 300 MG/ML  SOLN
100.0000 mL | Freq: Once | INTRAMUSCULAR | Status: AC | PRN
  Administered 2014-08-06: 100 mL via INTRAVENOUS

## 2014-08-07 MED FILL — Ondansetron HCl Tab 4 MG: ORAL | Qty: 4 | Status: AC

## 2014-08-07 MED FILL — Hydrocodone-Acetaminophen Tab 5-325 MG: ORAL | Qty: 6 | Status: AC

## 2014-11-07 ENCOUNTER — Telehealth: Payer: Self-pay | Admitting: *Deleted

## 2014-11-07 NOTE — Telephone Encounter (Signed)
Pt called and wanted her Clomid refilled. She stated that she was given the RX a while back and she never filled it. If you can her [facility] in [place]. Thanks

## 2014-11-08 NOTE — Telephone Encounter (Addendum)
-----   Message from Lavonia Drafts, MD sent at 11/07/2014  6:09 PM EST ----- This pt called for a refill on clomid.  This was Rx'd in 08/2013.  She needs to f/u with ANY provider to review.  Dr. Harolyn Rutherford Rx'd the Clomid.  clh-S   Called pt and was unable to leave message due to mailbox full.  Re: Pt needs to be informed that we can schedule her appt at the Clinics but we do not do infertility and per [facility] we are not going to refill her clomid.  If she is wanting infertility then we can give her the # to [place] to schedule an appt and that there is an up front cost and they can set up payment arrangements (pt does not have insurance).

## 2014-11-12 ENCOUNTER — Emergency Department (HOSPITAL_COMMUNITY)
Admission: EM | Admit: 2014-11-12 | Discharge: 2014-11-12 | Disposition: A | Discharge status: Home / Self Care | Payer: Self-pay | Attending: Emergency Medicine | Admitting: Emergency Medicine

## 2014-11-12 ENCOUNTER — Encounter (HOSPITAL_COMMUNITY): Payer: Self-pay | Admitting: Emergency Medicine

## 2014-11-12 DIAGNOSIS — K429 Umbilical hernia without obstruction or gangrene: Secondary | ICD-10-CM | POA: Insufficient documentation

## 2014-11-12 DIAGNOSIS — N390 Urinary tract infection, site not specified: Secondary | ICD-10-CM | POA: Insufficient documentation

## 2014-11-12 DIAGNOSIS — Z720 Tobacco use: Secondary | ICD-10-CM | POA: Insufficient documentation

## 2014-11-12 DIAGNOSIS — F419 Anxiety disorder, unspecified: Secondary | ICD-10-CM | POA: Insufficient documentation

## 2014-11-12 DIAGNOSIS — R109 Unspecified abdominal pain: Secondary | ICD-10-CM

## 2014-11-12 DIAGNOSIS — Z8742 Personal history of other diseases of the female genital tract: Secondary | ICD-10-CM | POA: Insufficient documentation

## 2014-11-12 DIAGNOSIS — F329 Major depressive disorder, single episode, unspecified: Secondary | ICD-10-CM | POA: Insufficient documentation

## 2014-11-12 DIAGNOSIS — Z79899 Other long term (current) drug therapy: Secondary | ICD-10-CM | POA: Insufficient documentation

## 2014-11-12 DIAGNOSIS — Z8619 Personal history of other infectious and parasitic diseases: Secondary | ICD-10-CM | POA: Insufficient documentation

## 2014-11-12 DIAGNOSIS — Z3202 Encounter for pregnancy test, result negative: Secondary | ICD-10-CM | POA: Insufficient documentation

## 2014-11-12 LAB — LACTIC ACID, PLASMA: Lactic Acid, Venous: 1.5 mmol/L (ref 0.5–2.2)

## 2014-11-12 LAB — CBC WITH DIFFERENTIAL/PLATELET
BASOS PCT: 0 % (ref 0–1)
Basophils Absolute: 0.1 10*3/uL (ref 0.0–0.1)
EOS ABS: 0.4 10*3/uL (ref 0.0–0.7)
Eosinophils Relative: 2 % (ref 0–5)
HCT: 43.4 % (ref 36.0–46.0)
Hemoglobin: 14.2 g/dL (ref 12.0–15.0)
LYMPHS ABS: 4.2 10*3/uL — AB (ref 0.7–4.0)
Lymphocytes Relative: 24 % (ref 12–46)
MCH: 29 pg (ref 26.0–34.0)
MCHC: 32.7 g/dL (ref 30.0–36.0)
MCV: 88.6 fL (ref 78.0–100.0)
Monocytes Absolute: 0.7 10*3/uL (ref 0.1–1.0)
Monocytes Relative: 4 % (ref 3–12)
NEUTROS ABS: 12.4 10*3/uL — AB (ref 1.7–7.7)
Neutrophils Relative %: 70 % (ref 43–77)
Platelets: 256 10*3/uL (ref 150–400)
RBC: 4.9 MIL/uL (ref 3.87–5.11)
RDW: 13.1 % (ref 11.5–15.5)
WBC: 17.8 10*3/uL — ABNORMAL HIGH (ref 4.0–10.5)

## 2014-11-12 LAB — COMPREHENSIVE METABOLIC PANEL
ALK PHOS: 67 U/L (ref 39–117)
ALT: 21 U/L (ref 0–35)
AST: 21 U/L (ref 0–37)
Albumin: 3.9 g/dL (ref 3.5–5.2)
Anion gap: 6 (ref 5–15)
BUN: 7 mg/dL (ref 6–23)
CO2: 27 mmol/L (ref 19–32)
Calcium: 8.7 mg/dL (ref 8.4–10.5)
Chloride: 103 mEq/L (ref 96–112)
Creatinine, Ser: 0.8 mg/dL (ref 0.50–1.10)
GFR calc Af Amer: >90 mL/min (ref >90)
GFR calc non Af Amer: >90 mL/min (ref >90)
Glucose, Bld: 130 mg/dL — ABNORMAL HIGH (ref 70–99)
POTASSIUM: 3.6 mmol/L (ref 3.5–5.1)
Sodium: 136 mmol/L (ref 135–145)
TOTAL PROTEIN: 7.2 g/dL (ref 6.0–8.3)
Total Bilirubin: 0.4 mg/dL (ref 0.3–1.2)

## 2014-11-12 LAB — URINE MICROSCOPIC-ADD ON

## 2014-11-12 LAB — URINALYSIS, ROUTINE W REFLEX MICROSCOPIC
BILIRUBIN URINE: NEGATIVE
Glucose, UA: NEGATIVE mg/dL
Ketones, ur: NEGATIVE mg/dL
Nitrite: NEGATIVE
SPECIFIC GRAVITY, URINE: 1.015 (ref 1.005–1.030)
UROBILINOGEN UA: 0.2 mg/dL (ref 0.0–1.0)
pH: 7 (ref 5.0–8.0)

## 2014-11-12 LAB — PREGNANCY, URINE: Preg Test, Ur: NEGATIVE

## 2014-11-12 LAB — LIPASE, BLOOD: Lipase: 25 U/L (ref 11–59)

## 2014-11-12 MED ORDER — HYDROMORPHONE HCL 1 MG/ML IJ SOLN
1.0000 mg | Freq: Once | INTRAMUSCULAR | Status: AC
  Administered 2014-11-12: 1 mg via INTRAVENOUS
  Filled 2014-11-12: qty 1

## 2014-11-12 MED ORDER — DEXTROSE 5 % IV SOLN
1.0000 g | Freq: Once | INTRAVENOUS | Status: AC
  Administered 2014-11-12: 1 g via INTRAVENOUS
  Filled 2014-11-12: qty 10

## 2014-11-12 MED ORDER — OXYCODONE-ACETAMINOPHEN 5-325 MG PO TABS: 1.0000 | ORAL_TABLET | ORAL | Status: DC | PRN

## 2014-11-12 MED ORDER — ONDANSETRON HCL 4 MG/2ML IJ SOLN
4.0000 mg | Freq: Once | INTRAMUSCULAR | Status: AC
  Administered 2014-11-12: 4 mg via INTRAVENOUS
  Filled 2014-11-12: qty 2

## 2014-11-12 MED ORDER — SODIUM CHLORIDE 0.9 % IV SOLN
1000.0000 mL | INTRAVENOUS | Status: DC
  Administered 2014-11-12: 1000 mL via INTRAVENOUS

## 2014-11-12 MED ORDER — CEPHALEXIN 500 MG PO CAPS: 500.0000 mg | ORAL_CAPSULE | Freq: Four times a day (QID) | ORAL | Status: DC

## 2014-11-12 MED ORDER — SODIUM CHLORIDE 0.9 % IV SOLN
1000.0000 mL | Freq: Once | INTRAVENOUS | Status: AC
  Administered 2014-11-12: 1000 mL via INTRAVENOUS

## 2014-11-12 NOTE — Telephone Encounter (Addendum)
Called Amber Ray and Amber Ray informed me that she was taking a medication so that she can have regular periods.  She also stated that the provider advised to wait on taking the clomid until she had regular periods and now her pharmacy is saying that she would need another Rx because she waited too long to fill.  I informed Amber Ray that we are no longer doing infertility in the office and that we can give her the # to Dr. Kerin Perna who specializes in [place].  Amber Ray did not really respond.  I verified if patient had insurance she stated that she did not have insurance.  I advised Amber Ray that she should give Dr. Kerin Perna office a call so that she discuss ways of payment.  Amber Ray asked so I can not just get another refill on the clomid.  I informed Amber Ray that the original Rx was over a year ago and that she would need to be seen but scheduling an appt here would not be for infertility.   Phone call disconnected.  Attempted to call the patient again and was unable to leave message due to mailbox full.

## 2014-11-12 NOTE — ED Notes (Signed)
Patient c/o lower back pain; states having UTI symptoms.  Patient also states has been diganosed with an umbilical hernia that is causing her pain.

## 2014-11-12 NOTE — ED Notes (Signed)
MD at bedside. 

## 2014-11-12 NOTE — ED Notes (Signed)
Pt given pre-pack of Percocet with instructions on administration and to not drive while taking this medication. Pt verbalized understanding of discharge instructions.

## 2014-11-12 NOTE — ED Provider Notes (Signed)
CSN: [id number]     Arrival date & time 11/12/14  0028 History   First MD Initiated Contact with Patient 11/12/14 0054     Chief Complaint  Patient presents with  . Umbilical Hernia  . Back Pain     (Consider location/radiation/quality/duration/timing/severity/associated sxs/prior Treatment) Patient is a 24 y.o. female presenting with back pain. The history is provided by the patient.  Back Pain She has a history of an umbilical hernia and states that she's been having pain in the periumbilical area for about the last week but it got worse over the last 2 days. She rates pain at 9/10. There is associated nausea but no vomiting. Nothing makes the pain better nothing makes it worse. She cannot find a position that is comfortable. She is also noted bilateral flank pain over the last several days. This is been associated with urinary urgency and mild dysuria. She denies fever or chills or sweats.  Past Medical History  Diagnosis Date  . Anxiety   . Chlamydia   . Gonorrhea   . Depression   . Bipolar affective disorder, depressed, mild   . 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 Dermoid cyst    Past Surgical History  Procedure Laterality Date  . Laparoscopy  07/27/2012    Procedure: LAPAROSCOPY OPERATIVE;  Surgeon: Mora Bellman, MD;  Location: [facility];  Service: Gynecology;  Laterality: N/A;  . Ovarian cyst removal  07/27/2012    Procedure: OVARIAN CYSTECTOMY;  Surgeon: Mora Bellman, MD;  Location: [facility];  Service: Gynecology;  Laterality: Right;  Dermoid Cyst   Family History  Problem Relation Age of Onset  . Diabetes Maternal Grandmother   . Arthritis Maternal Grandmother   . Arthritis Maternal Grandfather   . Diabetes Paternal Grandmother   . Hypertension Paternal Grandmother   . Arthritis Paternal Grandmother   . Kidney disease Paternal Grandmother   . Arthritis Paternal Grandfather    History  Substance Use Topics  . Smoking status: Current Every Day  Smoker -- 0.50 packs/day for 2 years    Types: Cigarettes  . Smokeless tobacco: Never Used  . Alcohol Use: 0.5 oz/week    1 drink(s) per week     Comment: occasionally   OB History    Gravida Para Term Preterm AB TAB SAB Ectopic Multiple Living   0    0  0   0     Review of Systems  Musculoskeletal: Positive for back pain.  All other systems reviewed and are negative.     Allergies  Review of patient's allergies indicates no known allergies.  Home Medications   Prior to Admission medications   Medication Sig Start Date End Date Taking? Authorizing Provider  metFORMIN (GLUCOPHAGE) 1000 MG tablet Take 1 tablet (1,000 mg total) by mouth 2 (two) times daily with a meal. 07/31/14  Yes Lavonia Drafts, MD  Multiple Vitamin (MULTIVITAMIN WITH MINERALS) TABS tablet Take 1 tablet by mouth daily.   Yes Historical Provider, MD  HYDROcodone-acetaminophen (NORCO) 5-325 MG per tablet Take 1 tablet by mouth every 6 (six) hours as needed (for pain). 08/06/14   John L Molpus, MD  naproxen (NAPROSYN) 500 MG tablet Take 500 mg by mouth 2 (two) times daily as needed. 05/31/14   [name], MD  ondansetron (ZOFRAN) 4 MG tablet Take 1 tablet (4 mg total) by mouth every 8 (eight) hours as needed for nausea or vomiting. 08/06/14   Karen Chafe  Molpus, MD  traZODone (DESYREL) 50 MG tablet Take 50 mg by mouth at bedtime as needed for sleep.    Historical Provider, MD   BP 93/64 mmHg  Pulse 82  Temp(Src) 98.2 F (36.8 C) (Oral)  Resp 18  Ht 5\' 4"  (1.626 m)  Wt 186 lb (84.369 kg)  BMI 31.91 kg/m2  SpO2 99% Physical Exam  Nursing note and vitals reviewed.  24 year old female, resting comfortably and in no acute distress. Vital signs are normal. Oxygen saturation is 99%, which is normal. Head is normocephalic and atraumatic. PERRLA, EOMI. Oropharynx is clear. Neck is nontender and supple without adenopathy or JVD. Back is nontender in the midline. There is mild bilateral CVA tenderness. Lungs are  clear without rales, wheezes, or rhonchi. Chest is nontender. Heart has regular rate and rhythm without murmur. Abdomen is soft, flat, with moderate periumbilical tenderness. Small umbilical hernia is present and easily reducible. There are no other masses or hepatosplenomegaly and peristalsis is hypoactive. Extremities have no cyanosis or edema, full range of motion is present. Skin is warm and dry without rash. Neurologic: Mental status is normal, cranial nerves are intact, there are no motor or sensory deficits.  ED Course  Procedures (including critical care time) Labs Review Results for orders placed or performed during the hospital encounter of 11/12/14  Comprehensive metabolic panel  Result Value Ref Range   Sodium 136 135 - 145 mmol/L   Potassium 3.6 3.5 - 5.1 mmol/L   Chloride 103 96 - 112 mEq/L   CO2 27 19 - 32 mmol/L   Glucose, Bld 130 (H) 70 - 99 mg/dL   BUN 7 6 - 23 mg/dL   Creatinine, Ser 0.80 0.50 - 1.10 mg/dL   Calcium 8.7 8.4 - 10.5 mg/dL   Total Protein 7.2 6.0 - 8.3 g/dL   Albumin 3.9 3.5 - 5.2 g/dL   AST 21 0 - 37 U/L   ALT 21 0 - 35 U/L   Alkaline Phosphatase 67 39 - 117 U/L   Total Bilirubin 0.4 0.3 - 1.2 mg/dL   GFR calc non Af Amer >90 >90 mL/min   GFR calc Af Amer >90 >90 mL/min   Anion gap 6 5 - 15  Lipase, blood  Result Value Ref Range   Lipase 25 11 - 59 U/L  CBC with Differential  Result Value Ref Range   WBC 17.8 (H) 4.0 - 10.5 K/uL   RBC 4.90 3.87 - 5.11 MIL/uL   Hemoglobin 14.2 12.0 - 15.0 g/dL   HCT 43.4 36.0 - 46.0 %   MCV 88.6 78.0 - 100.0 fL   MCH 29.0 26.0 - 34.0 pg   MCHC 32.7 30.0 - 36.0 g/dL   RDW 13.1 11.5 - 15.5 %   Platelets 256 150 - 400 K/uL   Neutrophils Relative % 70 43 - 77 %   Neutro Abs 12.4 (H) 1.7 - 7.7 K/uL   Lymphocytes Relative 24 12 - 46 %   Lymphs Abs 4.2 (H) 0.7 - 4.0 K/uL   Monocytes Relative 4 3 - 12 %   Monocytes Absolute 0.7 0.1 - 1.0 K/uL   Eosinophils Relative 2 0 - 5 %   Eosinophils Absolute 0.4 0.0 -  0.7 K/uL   Basophils Relative 0 0 - 1 %   Basophils Absolute 0.1 0.0 - 0.1 K/uL  Urinalysis, Routine w reflex microscopic  Result Value Ref Range   Color, Urine YELLOW YELLOW   APPearance CLEAR CLEAR   Specific  Gravity, Urine 1.015 1.005 - 1.030   pH 7.0 5.0 - 8.0   Glucose, UA NEGATIVE NEGATIVE mg/dL   Hgb urine dipstick MODERATE (A) NEGATIVE   Bilirubin Urine NEGATIVE NEGATIVE   Ketones, ur NEGATIVE NEGATIVE mg/dL   Protein, ur TRACE (A) NEGATIVE mg/dL   Urobilinogen, UA 0.2 0.0 - 1.0 mg/dL   Nitrite NEGATIVE NEGATIVE   Leukocytes, UA SMALL (A) NEGATIVE  Lactic acid, plasma  Result Value Ref Range   Lactic Acid, Venous 1.5 0.5 - 2.2 mmol/L  Pregnancy, urine  Result Value Ref Range   Preg Test, Ur NEGATIVE NEGATIVE  Urine microscopic-add on  Result Value Ref Range   Squamous Epithelial / LPF RARE RARE   WBC, UA 21-50 <3 WBC/hpf   RBC / HPF 3-6 <3 RBC/hpf   Bacteria, UA MANY (A) RARE   MDM   Final diagnoses:  Urinary tract infection without hematuria, site unspecified  Flank pain    Periumbilical pain with small umbilical hernia. Doubt if this is clinically significant. Old records have been reviewed and she has had several ED visits with similar complaints and CT scans have shown a small hernia which contains only fat. I do not see an indication for repeat CT scan today. Lactic acid level be checked to evaluate for possible vascular compromise and she will be treated symptomatically. Urinalysis will be checked.  WBC has come back significantly elevated, and a urinalysis has come back showing evidence of UTI with 21-50 WBCs and many bacteria. I believe this is sufficient to explain her symptoms. She is given a dose of ceftriaxone in the ED and is discharged with prescription for cephalexin and oxycodone-acetaminophen. She is referred to a general surgeon to discuss management of her umbilical hernia. t  Delora Fuel, MD 03/83/33 [id number]

## 2014-11-12 NOTE — Discharge Instructions (Signed)
Urinary Tract Infection Urinary tract infections (UTIs) can develop anywhere along your urinary tract. Your urinary tract is your body's drainage system for removing wastes and extra water. Your urinary tract includes two kidneys, two ureters, a bladder, and a urethra. Your kidneys are a pair of bean-shaped organs. Each kidney is about the size of your fist. They are located below your ribs, one on each side of your spine. CAUSES Infections are caused by microbes, which are microscopic organisms, including fungi, viruses, and bacteria. These organisms are so small that they can only be seen through a microscope. Bacteria are the microbes that most commonly cause UTIs. SYMPTOMS  Symptoms of UTIs may vary by age and gender of the patient and by the location of the infection. Symptoms in young women typically include a frequent and intense urge to urinate and a painful, burning feeling in the bladder or urethra during urination. Older women and men are more likely to be tired, shaky, and weak and have muscle aches and abdominal pain. A fever may mean the infection is in your kidneys. Other symptoms of a kidney infection include pain in your back or sides below the ribs, nausea, and vomiting. DIAGNOSIS To diagnose a UTI, your caregiver will ask you about your symptoms. Your caregiver also will ask to provide a urine sample. The urine sample will be tested for bacteria and white blood cells. White blood cells are made by your body to help fight infection. TREATMENT  Typically, UTIs can be treated with medication. Because most UTIs are caused by a bacterial infection, they usually can be treated with the use of antibiotics. The choice of antibiotic and length of treatment depend on your symptoms and the type of bacteria causing your infection. HOME CARE INSTRUCTIONS  If you were prescribed antibiotics, take them exactly as your caregiver instructs you. Finish the medication even if you feel better after you  have only taken some of the medication.  Drink enough water and fluids to keep your urine clear or pale yellow.  Avoid caffeine, tea, and carbonated beverages. They tend to irritate your bladder.  Empty your bladder often. Avoid holding urine for long periods of time.  Empty your bladder before and after sexual intercourse.  After a bowel movement, women should cleanse from front to back. Use each tissue only once. SEEK MEDICAL CARE IF:   You have back pain.  You develop a fever.  Your symptoms do not begin to resolve within 3 days. SEEK IMMEDIATE MEDICAL CARE IF:   You have severe back pain or lower abdominal pain.  You develop chills.  You have nausea or vomiting.  You have continued burning or discomfort with urination. MAKE SURE YOU:   Understand these instructions.  Will watch your condition.  Will get help right away if you are not doing well or get worse. Document Released: 08/05/2005 Document Revised: 04/26/2012 Document Reviewed: 12/04/2011 [facility] Patient Information 2015 [place], [place]. This information is not intended to replace advice given to you by your health care provider. Make sure you discuss any questions you have with your health care provider.  Cephalexin tablets or capsules What is this medicine? CEPHALEXIN (sef a LEX in) is a cephalosporin antibiotic. It is used to treat certain kinds of bacterial infections It will not work for colds, flu, or other viral infections. This medicine may be used for other purposes; ask your health care provider or pharmacist if you have questions. COMMON BRAND NAME(S): Biocef, Keflex, Keftab What should I  tell my health care provider before I take this medicine? They need to know if you have any of these conditions: -kidney disease -stomach or intestine problems, especially colitis -an unusual or allergic reaction to cephalexin, other cephalosporins, penicillins, other antibiotics, medicines, foods, dyes or  preservatives -pregnant or trying to get pregnant -breast-feeding How should I use this medicine? Take this medicine by mouth with a full glass of water. Follow the directions on the prescription label. This medicine can be taken with or without food. Take your medicine at regular intervals. Do not take your medicine more often than directed. Take all of your medicine as directed even if you think you are better. Do not skip doses or stop your medicine early. Talk to your pediatrician regarding the use of this medicine in children. While this drug may be prescribed for selected conditions, precautions do apply. Overdosage: If you think you have taken too much of this medicine contact a poison control center or emergency room at once. NOTE: This medicine is only for you. Do not share this medicine with others. What if I miss a dose? If you miss a dose, take it as soon as you can. If it is almost time for your next dose, take only that dose. Do not take double or extra doses. There should be at least 4 to 6 hours between doses. What may interact with this medicine? -probenecid -some other antibiotics This list may not describe all possible interactions. Give your health care provider a list of all the medicines, herbs, non-prescription drugs, or dietary supplements you use. Also tell them if you smoke, drink alcohol, or use illegal drugs. Some items may interact with your medicine. What should I watch for while using this medicine? Tell your doctor or health care professional if your symptoms do not begin to improve in a few days. Do not treat diarrhea with over the counter products. Contact your doctor if you have diarrhea that lasts more than 2 days or if it is severe and watery. If you have diabetes, you may get a false-positive result for sugar in your urine. Check with your doctor or health care professional. What side effects may I notice from receiving this medicine? Side effects that you  should report to your doctor or health care professional as soon as possible: -allergic reactions like skin rash, itching or hives, swelling of the face, lips, or tongue -breathing problems -pain or trouble passing urine -redness, blistering, peeling or loosening of the skin, including inside the mouth -severe or watery diarrhea -unusually weak or tired -yellowing of the eyes, skin Side effects that usually do not require medical attention (report to your doctor or health care professional if they continue or are bothersome): -gas or heartburn -genital or anal irritation -headache -joint or muscle pain -nausea, vomiting This list may not describe all possible side effects. Call your doctor for medical advice about side effects. You may report side effects to FDA at 1-800-FDA-1088. Where should I keep my medicine? Keep out of the reach of children. Store at room temperature between 59 and 86 degrees F (15 and 30 degrees C). Throw away any unused medicine after the expiration date. NOTE: This sheet is a summary. It may not cover all possible information. If you have questions about this medicine, talk to your doctor, pharmacist, or health care provider.  2015, [employer] Standard. (2008-01-30 17:09:13)  Acetaminophen; Oxycodone tablets What is this medicine? ACETAMINOPHEN; OXYCODONE (a set a MEE noe fen; ox i KOE  done) is a pain reliever. It is used to treat mild to moderate pain. This medicine may be used for other purposes; ask your health care provider or pharmacist if you have questions. COMMON BRAND NAME(S): Endocet, Magnacet, Narvox, Percocet, Perloxx, Primalev, Primlev, Roxicet, Xolox What should I tell my health care provider before I take this medicine? They need to know if you have any of these conditions: -brain tumor -Crohn's disease, inflammatory bowel disease, or ulcerative colitis -drug abuse or addiction -head injury -heart or circulation problems -if you often  drink alcohol -kidney disease or problems going to the bathroom -liver disease -lung disease, asthma, or breathing problems -an unusual or allergic reaction to acetaminophen, oxycodone, other opioid analgesics, other medicines, foods, dyes, or preservatives -pregnant or trying to get pregnant -breast-feeding How should I use this medicine? Take this medicine by mouth with a full glass of water. Follow the directions on the prescription label. Take your medicine at regular intervals. Do not take your medicine more often than directed. Talk to your pediatrician regarding the use of this medicine in children. Special care may be needed. Patients over 34 years old may have a stronger reaction and need a smaller dose. Overdosage: If you think you have taken too much of this medicine contact a poison control center or emergency room at once. NOTE: This medicine is only for you. Do not share this medicine with others. What if I miss a dose? If you miss a dose, take it as soon as you can. If it is almost time for your next dose, take only that dose. Do not take double or extra doses. What may interact with this medicine? -alcohol -antihistamines -barbiturates like amobarbital, butalbital, butabarbital, methohexital, pentobarbital, phenobarbital, thiopental, and secobarbital -benztropine -drugs for bladder problems like solifenacin, trospium, oxybutynin, tolterodine, hyoscyamine, and methscopolamine -drugs for breathing problems like ipratropium and tiotropium -drugs for certain stomach or intestine problems like propantheline, homatropine methylbromide, glycopyrrolate, atropine, belladonna, and dicyclomine -general anesthetics like etomidate, ketamine, nitrous oxide, propofol, desflurane, enflurane, halothane, isoflurane, and sevoflurane -medicines for depression, anxiety, or psychotic disturbances -medicines for sleep -muscle relaxants -naltrexone -narcotic medicines (opiates) for  pain -phenothiazines like perphenazine, thioridazine, chlorpromazine, mesoridazine, fluphenazine, prochlorperazine, promazine, and trifluoperazine -scopolamine -tramadol -trihexyphenidyl This list may not describe all possible interactions. Give your health care provider a list of all the medicines, herbs, non-prescription drugs, or dietary supplements you use. Also tell them if you smoke, drink alcohol, or use illegal drugs. Some items may interact with your medicine. What should I watch for while using this medicine? Tell your doctor or health care professional if your pain does not go away, if it gets worse, or if you have new or a different type of pain. You may develop tolerance to the medicine. Tolerance means that you will need a higher dose of the medication for pain relief. Tolerance is normal and is expected if you take this medicine for a long time. Do not suddenly stop taking your medicine because you may develop a severe reaction. Your body becomes used to the medicine. This does NOT mean you are addicted. Addiction is a behavior related to getting and using a drug for a non-medical reason. If you have pain, you have a medical reason to take pain medicine. Your doctor will tell you how much medicine to take. If your doctor wants you to stop the medicine, the dose will be slowly lowered over time to avoid any side effects. You may get drowsy or dizzy. Do not  drive, use machinery, or do anything that needs mental alertness until you know how this medicine affects you. Do not stand or sit up quickly, especially if you are an older patient. This reduces the risk of dizzy or fainting spells. Alcohol may interfere with the effect of this medicine. Avoid alcoholic drinks. There are different types of narcotic medicines (opiates) for pain. If you take more than one type at the same time, you may have more side effects. Give your health care provider a list of all medicines you use. Your doctor will  tell you how much medicine to take. Do not take more medicine than directed. Call emergency for help if you have problems breathing. The medicine will cause constipation. Try to have a bowel movement at least every 2 to 3 days. If you do not have a bowel movement for 3 days, call your doctor or health care professional. Do not take Tylenol (acetaminophen) or medicines that have acetaminophen with this medicine. Too much acetaminophen can be very dangerous. Many nonprescription medicines contain acetaminophen. Always read the labels carefully to avoid taking more acetaminophen. What side effects may I notice from receiving this medicine? Side effects that you should report to your doctor or health care professional as soon as possible: -allergic reactions like skin rash, itching or hives, swelling of the face, lips, or tongue -breathing difficulties, wheezing -confusion -light headedness or fainting spells -severe stomach pain -unusually weak or tired -yellowing of the skin or the whites of the eyes Side effects that usually do not require medical attention (report to your doctor or health care professional if they continue or are bothersome): -dizziness -drowsiness -nausea -vomiting This list may not describe all possible side effects. Call your doctor for medical advice about side effects. You may report side effects to FDA at 1-800-FDA-1088. Where should I keep my medicine? Keep out of the reach of children. This medicine can be abused. Keep your medicine in a safe place to protect it from theft. Do not share this medicine with anyone. Selling or giving away this medicine is dangerous and against the law. Store at room temperature between 20 and 25 degrees C (68 and 77 degrees F). Keep container tightly closed. Protect from light. This medicine may cause accidental overdose and death if it is taken by other adults, children, or pets. Flush any unused medicine down the toilet to reduce the  chance of harm. Do not use the medicine after the expiration date. NOTE: This sheet is a summary. It may not cover all possible information. If you have questions about this medicine, talk to your doctor, pharmacist, or health care provider.  2015, [employer] Standard. (2013-06-19 13:17:35)

## 2014-11-13 MED FILL — Oxycodone w/ Acetaminophen Tab 5-325 MG: ORAL | Qty: 6 | Status: AC

## 2014-11-14 ENCOUNTER — Emergency Department (HOSPITAL_COMMUNITY)
Admission: EM | Admit: 2014-11-14 | Discharge: 2014-11-14 | Disposition: A | Discharge status: Home / Self Care | Payer: Self-pay | Attending: Emergency Medicine | Admitting: Emergency Medicine

## 2014-11-14 ENCOUNTER — Encounter (HOSPITAL_COMMUNITY): Payer: Self-pay | Admitting: *Deleted

## 2014-11-14 DIAGNOSIS — R112 Nausea with vomiting, unspecified: Secondary | ICD-10-CM | POA: Insufficient documentation

## 2014-11-14 DIAGNOSIS — Z8742 Personal history of other diseases of the female genital tract: Secondary | ICD-10-CM | POA: Insufficient documentation

## 2014-11-14 DIAGNOSIS — Z720 Tobacco use: Secondary | ICD-10-CM | POA: Insufficient documentation

## 2014-11-14 DIAGNOSIS — Z87440 Personal history of urinary (tract) infections: Secondary | ICD-10-CM | POA: Insufficient documentation

## 2014-11-14 DIAGNOSIS — M545 Low back pain, unspecified: Secondary | ICD-10-CM

## 2014-11-14 DIAGNOSIS — Z9889 Other specified postprocedural states: Secondary | ICD-10-CM | POA: Insufficient documentation

## 2014-11-14 DIAGNOSIS — Z8659 Personal history of other mental and behavioral disorders: Secondary | ICD-10-CM | POA: Insufficient documentation

## 2014-11-14 DIAGNOSIS — Z79899 Other long term (current) drug therapy: Secondary | ICD-10-CM | POA: Insufficient documentation

## 2014-11-14 DIAGNOSIS — Z792 Long term (current) use of antibiotics: Secondary | ICD-10-CM | POA: Insufficient documentation

## 2014-11-14 DIAGNOSIS — Z8619 Personal history of other infectious and parasitic diseases: Secondary | ICD-10-CM | POA: Insufficient documentation

## 2014-11-14 LAB — BASIC METABOLIC PANEL
Anion gap: 6 (ref 5–15)
BUN: 7 mg/dL (ref 6–23)
CALCIUM: 9 mg/dL (ref 8.4–10.5)
CHLORIDE: 105 meq/L (ref 96–112)
CO2: 28 mmol/L (ref 19–32)
CREATININE: 0.76 mg/dL (ref 0.50–1.10)
GFR calc Af Amer: >90 mL/min (ref >90)
GLUCOSE: 92 mg/dL (ref 70–99)
POTASSIUM: 3.5 mmol/L (ref 3.5–5.1)
SODIUM: 139 mmol/L (ref 135–145)

## 2014-11-14 LAB — CBC WITH DIFFERENTIAL/PLATELET
BASOS ABS: 0 10*3/uL (ref 0.0–0.1)
BASOS PCT: 0 % (ref 0–1)
EOS ABS: 0.2 10*3/uL (ref 0.0–0.7)
Eosinophils Relative: 2 % (ref 0–5)
HEMATOCRIT: 43.8 % (ref 36.0–46.0)
Hemoglobin: 14.4 g/dL (ref 12.0–15.0)
LYMPHS PCT: 27 % (ref 12–46)
Lymphs Abs: 2.3 10*3/uL (ref 0.7–4.0)
MCH: 28.8 pg (ref 26.0–34.0)
MCHC: 32.9 g/dL (ref 30.0–36.0)
MCV: 87.6 fL (ref 78.0–100.0)
Monocytes Absolute: 1 10*3/uL (ref 0.1–1.0)
Monocytes Relative: 12 % (ref 3–12)
NEUTROS PCT: 59 % (ref 43–77)
Neutro Abs: 4.8 10*3/uL (ref 1.7–7.7)
Platelets: 237 10*3/uL (ref 150–400)
RBC: 5 MIL/uL (ref 3.87–5.11)
RDW: 12.9 % (ref 11.5–15.5)
WBC: 8.3 10*3/uL (ref 4.0–10.5)

## 2014-11-14 LAB — URINALYSIS, ROUTINE W REFLEX MICROSCOPIC
Bilirubin Urine: NEGATIVE
GLUCOSE, UA: NEGATIVE mg/dL
HGB URINE DIPSTICK: NEGATIVE
LEUKOCYTES UA: NEGATIVE
Nitrite: NEGATIVE
PH: 6 (ref 5.0–8.0)
Protein, ur: NEGATIVE mg/dL
SPECIFIC GRAVITY, URINE: 1.025 (ref 1.005–1.030)
Urobilinogen, UA: 0.2 mg/dL (ref 0.0–1.0)

## 2014-11-14 MED ORDER — KETOROLAC TROMETHAMINE 30 MG/ML IJ SOLN
30.0000 mg | Freq: Once | INTRAMUSCULAR | Status: AC
  Administered 2014-11-14: 30 mg via INTRAVENOUS
  Filled 2014-11-14: qty 1

## 2014-11-14 MED ORDER — SODIUM CHLORIDE 0.9 % IV BOLUS (SEPSIS)
500.0000 mL | Freq: Once | INTRAVENOUS | Status: AC
  Administered 2014-11-14: 500 mL via INTRAVENOUS

## 2014-11-14 NOTE — ED Notes (Signed)
Patient is tearful and states that her back is hurting really bad.

## 2014-11-14 NOTE — Discharge Instructions (Signed)
Test shows that you do not have a urinary tract infection today. We did a urine culture that will be reported in one or 2 days, and will call you if there is a problem. For the pain, take ibuprofen or Tylenol. Try using heat in your back and do some gentle stretching exercises to improve your mobility.    Back Pain, Adult Low back pain is very common. About 1 in 5 people have back pain.The cause of low back pain is rarely dangerous. The pain often gets better over time.About half of people with a sudden onset of back pain feel better in just 2 weeks. About 8 in 10 people feel better by 6 weeks.  CAUSES Some common causes of back pain include:  Strain of the muscles or ligaments supporting the spine.  Wear and tear (degeneration) of the spinal discs.  Arthritis.  Direct injury to the back. DIAGNOSIS Most of the time, the direct cause of low back pain is not known.However, back pain can be treated effectively even when the exact cause of the pain is unknown.Answering your caregiver's questions about your overall health and symptoms is one of the most accurate ways to make sure the cause of your pain is not dangerous. If your caregiver needs more information, he or she may order lab work or imaging tests (X-rays or MRIs).However, even if imaging tests show changes in your back, this usually does not require surgery. HOME CARE INSTRUCTIONS For many people, back pain returns.Since low back pain is rarely dangerous, it is often a condition that people can learn to [facility] their own.   Remain active. It is stressful on the back to sit or stand in one place. Do not sit, drive, or stand in one place for more than 30 minutes at a time. Take short walks on level surfaces as soon as pain allows.Try to increase the length of time you walk each day.  Do not stay in bed.Resting more than 1 or 2 days can delay your recovery.  Do not avoid exercise or work.Your body is made to move.It is not  dangerous to be active, even though your back may hurt.Your back will likely heal faster if you return to being active before your pain is gone.  Pay attention to your body when you bend and lift. Many people have less discomfortwhen lifting if they bend their knees, keep the load close to their bodies,and avoid twisting. Often, the most comfortable positions are those that put less stress on your recovering back.  Find a comfortable position to sleep. Use a firm mattress and lie on your side with your knees slightly bent. If you lie on your back, put a pillow under your knees.  Only take over-the-counter or prescription medicines as directed by your caregiver. Over-the-counter medicines to reduce pain and inflammation are often the most helpful.Your caregiver may prescribe muscle relaxant drugs.These medicines help dull your pain so you can more quickly return to your normal activities and healthy exercise.  Put ice on the injured area.  Put ice in a plastic bag.  Place a towel between your skin and the bag.  Leave the ice on for 15-20 minutes, 03-04 times a day for the first 2 to 3 days. After that, ice and heat may be alternated to reduce pain and spasms.  Ask your caregiver about trying back exercises and gentle massage. This may be of some benefit.  Avoid feeling anxious or stressed.Stress increases muscle tension and can worsen back  pain.It is important to recognize when you are anxious or stressed and learn ways to manage it.Exercise is a great option. SEEK MEDICAL CARE IF:  You have pain that is not relieved with rest or medicine.  You have pain that does not improve in 1 week.  You have new symptoms.  You are generally not feeling well. SEEK IMMEDIATE MEDICAL CARE IF:   You have pain that radiates from your back into your legs.  You develop new bowel or bladder control problems.  You have unusual weakness or numbness in your arms or legs.  You develop nausea or  vomiting.  You develop abdominal pain.  You feel faint. Document Released: 10/26/2005 Document Revised: 04/26/2012 Document Reviewed: 02/27/2014 [facility] Patient Information 2015 [place], [place]. This information is not intended to replace advice given to you by your health care provider. Make sure you discuss any questions you have with your health care provider.  Back Exercises Back exercises help treat and prevent back injuries. The goal of back exercises is to increase the strength of your abdominal and back muscles and the flexibility of your back. These exercises should be started when you no longer have back pain. Back exercises include:  Pelvic Tilt. Lie on your back with your knees bent. Tilt your pelvis until the lower part of your back is against the floor. Hold this position 5 to 10 sec and repeat 5 to 10 times.  Knee to Chest. Pull first 1 knee up against your chest and hold for 20 to 30 seconds, repeat this with the other knee, and then both knees. This may be done with the other leg straight or bent, whichever feels better.  Sit-Ups or Curl-Ups. Bend your knees 90 degrees. Start with tilting your pelvis, and do a partial, slow sit-up, lifting your trunk only 30 to 45 degrees off the floor. Take at least 2 to 3 seconds for each sit-up. Do not do sit-ups with your knees out straight. If partial sit-ups are difficult, simply do the above but with only tightening your abdominal muscles and holding it as directed.  Hip-Lift. Lie on your back with your knees flexed 90 degrees. Push down with your feet and shoulders as you raise your hips a couple inches off the floor; hold for 10 seconds, repeat 5 to 10 times.  Back arches. Lie on your stomach, propping yourself up on bent elbows. Slowly press on your hands, causing an arch in your low back. Repeat 3 to 5 times. Any initial stiffness and discomfort should lessen with repetition over time.  Shoulder-Lifts. Lie face down with arms beside  your body. Keep hips and torso pressed to floor as you slowly lift your head and shoulders off the floor. Do not overdo your exercises, especially in the beginning. Exercises may cause you some mild back discomfort which lasts for a few minutes; however, if the pain is more severe, or lasts for more than 15 minutes, do not continue exercises until you see your caregiver. Improvement with exercise therapy for back problems is slow.  See your caregivers for assistance with developing a proper back exercise program. Document Released: 12/03/2004 Document Revised: 01/18/2012 Document Reviewed: 08/27/2011 [facility] Patient Information 2015 [place], [place]. This information is not intended to replace advice given to you by your health care provider. Make sure you discuss any questions you have with your health care provider.

## 2014-11-14 NOTE — ED Notes (Signed)
MD at bedside. 

## 2014-11-14 NOTE — ED Notes (Signed)
Pt reporting continued lower abdominal pain radiating across back that has not been relieved with the pre-pack of Percocet she was given on Monday when she was seen here. Pt reports she was unable to get her prescription for the antibiotic filled and will be unable to do so until tomorrow. Pt also reports running a fever with N/V yesterday.

## 2014-11-14 NOTE — ED Notes (Signed)
Pt states she was here Monday night for UTI and was prescribed antibiotics but is unable to get antibiotics until after she gets paid tomorrow. Pt states symtpoms are much worse. Pt states chills and vomiting.

## 2014-11-14 NOTE — ED Provider Notes (Signed)
CSN: [id number]     Arrival date & time 11/14/14  1741 History  This chart was scribed for Richarda Blade, MD by Delphia Grates, ED Scribe. This patient was seen in room APA03/APA03 and the patient's care was started at 7:23 PM.   Chief Complaint  Patient presents with  . Urinary Tract Infection    The history is provided by the patient. No language interpreter was used.     HPI Comments: Amber Ray is a 24 y.o. female who presents to the Emergency Department complaining of urinary tract infection for the past 2 days. Patient was seen here 2 days ago and was diagnosed with a UTI. She reports that she was prescribed ABX, is not unable to fill this prescription until she gets paid tomorrow. She reports her symptoms have worsened in the last 2 days. There is associated vomiting since last night and worsening, constant lower back pain. She also notes subjective fever, but states this resolved last night. Patient has been taking Percocet, Tylenol, and Motrin without significant pain relief. Patient has past surgical history of right ovarian cystectomy, approximately 2 years ago.  No PCP  [facility]: [facility]  Past Medical History  Diagnosis Date  . Anxiety   . Chlamydia   . Gonorrhea   . Depression   . Bipolar affective disorder, depressed, mild   . 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 Dermoid cyst    Past Surgical History  Procedure Laterality Date  . Laparoscopy  07/27/2012    Procedure: LAPAROSCOPY OPERATIVE;  Surgeon: Mora Bellman, MD;  Location: [place] [facility];  Service: Gynecology;  Laterality: N/A;  . Ovarian cyst removal  07/27/2012    Procedure: OVARIAN CYSTECTOMY;  Surgeon: Mora Bellman, MD;  Location: [place] [facility];  Service: Gynecology;  Laterality: Right;  Dermoid Cyst   Family History  Problem Relation Age of Onset  . Diabetes Maternal Grandmother   . Arthritis Maternal Grandmother   . Arthritis Maternal Grandfather   .  Diabetes Paternal Grandmother   . Hypertension Paternal Grandmother   . Arthritis Paternal Grandmother   . Kidney disease Paternal Grandmother   . Arthritis Paternal Grandfather    History  Substance Use Topics  . Smoking status: Current Every Day Smoker -- 0.50 packs/day for 2 years    Types: Cigarettes  . Smokeless tobacco: Never Used  . Alcohol Use: 0.5 oz/week    1 drink(s) per week     Comment: occasionally   OB History    Gravida Para Term Preterm AB TAB SAB Ectopic Multiple Living   0    0  0   0     Review of Systems  Constitutional: Positive for fever (subjectivel; resolved).  Gastrointestinal: Positive for nausea and vomiting.  Musculoskeletal: Positive for back pain.  All other systems reviewed and are negative.     Allergies  Review of patient's allergies indicates no known allergies.  Home Medications   Prior to Admission medications   Medication Sig Start Date End Date Taking? Authorizing Provider  metFORMIN (GLUCOPHAGE) 1000 MG tablet Take 1 tablet (1,000 mg total) by mouth 2 (two) times daily with a meal. 07/31/14  Yes Lavonia Drafts, MD  Multiple Vitamin (MULTIVITAMIN WITH MINERALS) TABS tablet Take 1 tablet by mouth daily.   Yes Historical Provider, MD  oxyCODONE-acetaminophen (PERCOCET) 5-325 MG per tablet Take 1 tablet by mouth every 4 (four) hours as needed for moderate pain.  12/18/49  Yes Delora Fuel, MD  cephALEXin (KEFLEX) 500 MG capsule Take 1 capsule (500 mg total) by mouth 4 (four) times daily. 06/16/40   Delora Fuel, MD  HYDROcodone-acetaminophen (NORCO) 5-325 MG per tablet Take 1 tablet by mouth every 6 (six) hours as needed (for pain). Patient not taking: Reported on 11/14/2014 08/06/14   Karen Chafe Molpus, MD  ondansetron (ZOFRAN) 4 MG tablet Take 1 tablet (4 mg total) by mouth every 8 (eight) hours as needed for nausea or vomiting. Patient not taking: Reported on 11/14/2014 08/06/14   Karen Chafe Molpus, MD   Triage Vitals: BP 103/79 mmHg  Pulse 97   Temp(Src) 98.8 F (37.1 C) (Oral)  Ht 5\' 4"  (1.626 m)  Wt 186 lb (84.369 kg)  BMI 31.91 kg/m2  SpO2 100%  LMP 10/17/2014  Physical Exam  Constitutional: She is oriented to person, place, and time. She appears well-developed and well-nourished.  HENT:  Head: Normocephalic and atraumatic.  Eyes: Conjunctivae and EOM are normal. Pupils are equal, round, and reactive to light.  Neck: Normal range of motion and phonation normal. Neck supple.  Cardiovascular: Normal rate and regular rhythm.   Pulmonary/Chest: Effort normal and breath sounds normal. She exhibits no tenderness.  Abdominal: Soft. She exhibits no distension. There is no tenderness. There is no guarding.  Genitourinary:  No CVAT   Musculoskeletal: Normal range of motion.  Neurological: She is alert and oriented to person, place, and time. She exhibits normal muscle tone.  Skin: Skin is warm and dry.  Psychiatric: Her behavior is normal. Judgment and thought content normal.  Patient is crying.  Nursing note and vitals reviewed.   ED Course  Procedures (including critical care time)  DIAGNOSTIC STUDIES: Oxygen Saturation is 100% on room air, normal by my interpretation.    COORDINATION OF CARE:  Medications  sodium chloride 0.9 % bolus 500 mL (0 mLs Intravenous Stopped 11/14/14 2015)  ketorolac (TORADOL) 30 MG/ML injection 30 mg (30 mg Intravenous Given 11/14/14 2102)    Patient Vitals for the past 24 hrs:  BP Temp Temp src Pulse Resp SpO2 Height Weight  11/14/14 2047 106/65 mmHg - - 77 20 99 % - -  11/14/14 1758 103/79 mmHg 98.8 F (37.1 C) Oral 97 - 100 % 5\' 4"  (1.626 m) 186 lb (84.369 kg)    At 1927 Discussed treatment plan with patient. Patient agrees.   At 2103 Upon reevaluation, informed patient of negative results for UTI and her back pain is possibly due to muscle. Patient advised to apply heat and take Tylenol and ibuprofen for pain. Patient agrees.  Labs Review Labs Reviewed  URINALYSIS, ROUTINE W REFLEX  MICROSCOPIC - Abnormal; Notable for the following:    Ketones, ur TRACE (*)    All other components within normal limits  URINE CULTURE  BASIC METABOLIC PANEL  CBC WITH DIFFERENTIAL    Imaging Review No results found.   EKG Interpretation None      MDM   Final diagnoses:  Bilateral low back pain without sciatica    Nonspecific low back pain, with normal urinalysis.  Recent urine culture, 4 days ago is consistent with skin contaminant.  No systemic illness at this time.  Doubt bacteremia.  Metabolic instability or impending vascular collapse.   Nursing Notes Reviewed/ Care Coordinated Applicable Imaging Reviewed Interpretation of Laboratory Data incorporated into ED treatment  The patient appears reasonably screened and/or stabilized for discharge and I doubt any other medical condition or other [facility] requiring further  screening, evaluation, or treatment in the ED at this time prior to discharge.  Plan: Home Medications- IBU prn pain; Home Treatments- Rest, fluids; return here if the recommended treatment, does not improve the symptoms; Recommended follow up- PCP of choice prn   I personally performed the services described in this documentation, which was scribed in my presence. The recorded information has been reviewed and is accurate.     Richarda Blade, MD 11/15/14 [phone]

## 2014-11-15 LAB — URINE CULTURE: Colony Count: >=100000

## 2014-11-16 ENCOUNTER — Telehealth (HOSPITAL_COMMUNITY): Payer: Self-pay

## 2014-11-16 LAB — URINE CULTURE
Colony Count: NO GROWTH
Culture: NO GROWTH

## 2014-11-16 NOTE — Telephone Encounter (Signed)
Post ED Visit - Positive Culture Follow-up  Culture report reviewed by antimicrobial stewardship pharmacist: []  Wes [place], Pharm.D., BCPS []  Heide Guile, Pharm.D., BCPS []  Alycia Rossetti, Pharm.D., BCPS []  [place], Pharm.D., BCPS, AAHIVP [x]  Legrand Como, Pharm.D., BCPS, AAHIVP []  Isac Sarna, Pharm.D., BCPS  Positive Urine culture, >/= 100,000 colonies -> Staph. species Treated with Cephalexin, organism sensitive to the same and no further patient follow-up is required at this time.  Dortha Kern 11/16/2014, 12:07 PM

## 2014-12-12 ENCOUNTER — Encounter (HOSPITAL_COMMUNITY): Payer: Self-pay | Admitting: *Deleted

## 2014-12-12 ENCOUNTER — Inpatient Hospital Stay (HOSPITAL_COMMUNITY)
Admission: AD | Admit: 2014-12-12 | Discharge: 2014-12-12 | Disposition: A | Discharge status: Home / Self Care | Payer: Self-pay | Source: Ambulatory Visit | Attending: Obstetrics & Gynecology | Admitting: Obstetrics & Gynecology

## 2014-12-12 ENCOUNTER — Inpatient Hospital Stay (HOSPITAL_COMMUNITY): Payer: Self-pay

## 2014-12-12 DIAGNOSIS — N939 Abnormal uterine and vaginal bleeding, unspecified: Secondary | ICD-10-CM | POA: Insufficient documentation

## 2014-12-12 DIAGNOSIS — Z8742 Personal history of other diseases of the female genital tract: Secondary | ICD-10-CM

## 2014-12-12 DIAGNOSIS — N946 Dysmenorrhea, unspecified: Secondary | ICD-10-CM | POA: Insufficient documentation

## 2014-12-12 DIAGNOSIS — E282 Polycystic ovarian syndrome: Secondary | ICD-10-CM | POA: Insufficient documentation

## 2014-12-12 DIAGNOSIS — R109 Unspecified abdominal pain: Secondary | ICD-10-CM | POA: Insufficient documentation

## 2014-12-12 DIAGNOSIS — F17210 Nicotine dependence, cigarettes, uncomplicated: Secondary | ICD-10-CM | POA: Insufficient documentation

## 2014-12-12 LAB — WET PREP, GENITAL
Clue Cells Wet Prep HPF POC: NONE SEEN
TRICH WET PREP: NONE SEEN
Yeast Wet Prep HPF POC: NONE SEEN

## 2014-12-12 LAB — CBC
HCT: 39.9 % (ref 36.0–46.0)
Hemoglobin: 13.3 g/dL (ref 12.0–15.0)
MCH: 28.5 pg (ref 26.0–34.0)
MCHC: 33.3 g/dL (ref 30.0–36.0)
MCV: 85.6 fL (ref 78.0–100.0)
Platelets: 219 K/uL (ref 150–400)
RBC: 4.66 MIL/uL (ref 3.87–5.11)
RDW: 12.9 % (ref 11.5–15.5)
WBC: 12.6 K/uL — ABNORMAL HIGH (ref 4.0–10.5)

## 2014-12-12 LAB — URINE MICROSCOPIC-ADD ON

## 2014-12-12 LAB — URINALYSIS, ROUTINE W REFLEX MICROSCOPIC
Bilirubin Urine: NEGATIVE
GLUCOSE, UA: NEGATIVE mg/dL
KETONES UR: NEGATIVE mg/dL
NITRITE: NEGATIVE
PROTEIN: NEGATIVE mg/dL
SPECIFIC GRAVITY, URINE: 1.015 (ref 1.005–1.030)
UROBILINOGEN UA: 0.2 mg/dL (ref 0.0–1.0)
pH: 7 (ref 5.0–8.0)

## 2014-12-12 LAB — POCT PREGNANCY, URINE: PREG TEST UR: NEGATIVE

## 2014-12-12 MED ORDER — MEDROXYPROGESTERONE ACETATE 10 MG PO TABS: 10.0000 mg | ORAL_TABLET | Freq: Every day | ORAL | Status: DC

## 2014-12-12 MED ORDER — KETOROLAC TROMETHAMINE 60 MG/2ML IM SOLN
60.0000 mg | Freq: Once | INTRAMUSCULAR | Status: AC
  Administered 2014-12-12: 60 mg via INTRAMUSCULAR
  Filled 2014-12-12: qty 2

## 2014-12-12 NOTE — MAU Note (Signed)
Started bleeding the last wk in Dec.  Continues to bleed.  Pain is getting worse.  Has been trying to get into the clinic.

## 2014-12-12 NOTE — Discharge Instructions (Signed)
Dysmenorrhea Dysmenorrhea is pain during a menstrual period. You will have pain in the lower belly (abdomen). The pain is caused by the tightening (contracting) of the muscles of the uterus. The pain can be minor or severe. Headache, feeling sick to your stomach (nausea), throwing up (vomiting), or low back pain may occur with this condition. HOME CARE  Only take medicine as told by your doctor.  Place a heating pad or hot water bottle on your lower back or belly. Do not sleep with a heating pad.  Exercise may help lessen the pain.  Massage the lower back or belly.  Stop smoking.  Avoid alcohol and caffeine. GET HELP IF:   Your pain does not get better with medicine.  You have pain during sex.  Your pain gets worse while taking pain medicine.  Your period bleeding is heavier than normal.  You keep feeling sick to your stomach or keep throwing up. GET HELP RIGHT AWAY IF: You pass out (faint). Document Released: 01/22/2009 Document Revised: 10/31/2013 Document Reviewed: 04/13/2013 [facility] Patient Information 2015 [place], [place]. This information is not intended to replace advice given to you by your health care provider. Make sure you discuss any questions you have with your health care provider. Abnormal Uterine Bleeding Abnormal uterine bleeding means bleeding from the vagina that is not your normal menstrual period. This can be:  Bleeding or spotting between periods.  Bleeding after sex (sexual intercourse).  Bleeding that is heavier or more than normal.  Periods that last longer than usual.  Bleeding after menopause. There are many problems that may cause this. Treatment will depend on the cause of the bleeding. Any kind of bleeding that is not normal should be reviewed by your doctor.  HOME CARE Watch your condition for any changes. These actions may lessen any discomfort you are having:  Do not use tampons or douches as told by your doctor.  Change your pads  often. You should get regular pelvic exams and Pap tests. Keep all appointments for tests as told by your doctor. GET HELP IF:  You are bleeding for more than 1 week.  You feel dizzy at times. GET HELP RIGHT AWAY IF:   You pass out.  You have to change pads every 15 to 30 minutes.  You have belly pain.  You have a fever.  You become sweaty or weak.  You are passing large blood clots from the vagina.  You feel sick to your stomach (nauseous) and throw up (vomit). MAKE SURE YOU:  Understand these instructions.  Will watch your condition.  Will get help right away if you are not doing well or get worse. Document Released: 08/23/2009 Document Revised: 10/31/2013 Document Reviewed: 05/25/2013 [facility] Patient Information 2015 [place], [place]. This information is not intended to replace advice given to you by your health care provider. Make sure you discuss any questions you have with your health care provider.   Free Pap Smear Screening:  12/31/14 from 6:00 pm- 7:30 pm at [facility] in [place]

## 2014-12-12 NOTE — MAU Note (Signed)
Pt states she started having vaginal bleeding the end of Dec. Pt states periods are irregular and she can not tell if this was period.  Pt states she was given med to make periods regular. Pt states she took medication 1 time  7days and has not taken medication again. Pt states she had a period the beginning of Decemeber.. Pt bleedings had been heavy as a period "90 percent" pt has had a pad or a tampon on since the end of DE.

## 2014-12-12 NOTE — MAU Provider Note (Signed)
History     CSN: [id number]  Arrival date and time: 12/12/14 1749   None     Chief Complaint  Patient presents with  . Vaginal Bleeding   HPI   Amber Ray is a 24 y.o. female [place] who presents with heavy vaginal bleeding and abdominal cramping. The bleeding started at the end of December. She had her regular menstrual cycle at the beginning of December. Her cycle started again at the end of December and has continued to bleed. She has tried getting into the clinic, however they do not have any appointments. She has not taken anything for pain today and currently rates her abdominal pain 9/10.   She has PCOS and a history of irregular menstrual cycles.  Patient is crying stating that she is tired of the pain and bleeding.   OB History    Gravida Para Term Preterm AB TAB SAB Ectopic Multiple Living   0    0  0   0      Past Medical History  Diagnosis Date  . Anxiety   . Chlamydia   . Gonorrhea   . Depression   . Bipolar affective disorder, depressed, mild   . 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 Dermoid cyst     Past Surgical History  Procedure Laterality Date  . Laparoscopy  07/27/2012    Procedure: LAPAROSCOPY OPERATIVE;  Surgeon: Mora Bellman, MD;  Location: [place] [facility];  Service: Gynecology;  Laterality: N/A;  . Ovarian cyst removal  07/27/2012    Procedure: OVARIAN CYSTECTOMY;  Surgeon: Mora Bellman, MD;  Location: [place] [facility];  Service: Gynecology;  Laterality: Right;  Dermoid Cyst    Family History  Problem Relation Age of Onset  . Diabetes Maternal Grandmother   . Arthritis Maternal Grandmother   . Arthritis Maternal Grandfather   . Diabetes Paternal Grandmother   . Hypertension Paternal Grandmother   . Arthritis Paternal Grandmother   . Kidney disease Paternal Grandmother   . Arthritis Paternal Grandfather     History  Substance Use Topics  . Smoking status: Current Every Day Smoker -- 0.50 packs/day for 2 years    Types:  Cigarettes  . Smokeless tobacco: Never Used  . Alcohol Use: 0.5 oz/week    1 drink(s) per week     Comment: occasionally    Allergies: No Known Allergies  No prescriptions prior to admission    Review of Systems  Constitutional: Negative for fever and chills.  Gastrointestinal: Positive for abdominal pain.  Genitourinary:       History of irregular vaginal bleeding    Physical Exam   Blood pressure 121/77, pulse 90, temperature 99.1 F (37.3 C), temperature source Oral, resp. rate 16, weight 184 lb (83.462 kg), last menstrual period 11/04/2014.  Physical Exam  Constitutional: She is oriented to person, place, and time. She appears well-developed and well-nourished. No distress.  HENT:  Head: Normocephalic.  Eyes: Pupils are equal, round, and reactive to light.  Neck: Neck supple.  Respiratory: Effort normal.  GI: Soft. She exhibits no distension. There is no tenderness.  Genitourinary:  Speculum exam: Vagina - Small amount of dark red vaginal bleeding Cervix - No contact bleeding. Multiple, smooth, elevated papules. Non tender, non oozing, pink in color.  Bimanual exam: Cervix closed, no CMT  Uterus non tender, normal size Adnexa non tender, no masses bilaterally GC/Chlam, wet prep done Chaperone present for exam.   Musculoskeletal: Normal range  of motion.  Neurological: She is alert and oriented to person, place, and time.  Skin: Skin is warm. She is not diaphoretic.  Psychiatric: Her behavior is normal.  Patient is very tearful during interview    Results for orders placed or performed during the hospital encounter of 12/12/14 (from the past 24 hour(s))  Urinalysis, Routine w reflex microscopic     Status: Abnormal   Collection Time: 12/12/14  6:15 PM  Result Value Ref Range   Color, Urine YELLOW YELLOW   APPearance CLEAR CLEAR   Specific Gravity, Urine 1.015 1.005 - 1.030   pH 7.0 5.0 - 8.0   Glucose, UA NEGATIVE NEGATIVE mg/dL   Hgb urine dipstick TRACE  (A) NEGATIVE   Bilirubin Urine NEGATIVE NEGATIVE   Ketones, ur NEGATIVE NEGATIVE mg/dL   Protein, ur NEGATIVE NEGATIVE mg/dL   Urobilinogen, UA 0.2 0.0 - 1.0 mg/dL   Nitrite NEGATIVE NEGATIVE   Leukocytes, UA SMALL (A) NEGATIVE  Urine microscopic-add on     Status: Abnormal   Collection Time: 12/12/14  6:15 PM  Result Value Ref Range   Squamous Epithelial / LPF FEW (A) RARE   WBC, UA 11-20 <3 WBC/hpf   RBC / HPF 7-10 <3 RBC/hpf   Bacteria, UA FEW (A) RARE  Pregnancy, urine POC     Status: None   Collection Time: 12/12/14  6:20 PM  Result Value Ref Range   Preg Test, Ur NEGATIVE NEGATIVE  CBC     Status: Abnormal   Collection Time: 12/12/14  6:45 PM  Result Value Ref Range   WBC 12.6 (H) 4.0 - 10.5 K/uL   RBC 4.66 3.87 - 5.11 MIL/uL   Hemoglobin 13.3 12.0 - 15.0 g/dL   HCT 39.9 36.0 - 46.0 %   MCV 85.6 78.0 - 100.0 fL   MCH 28.5 26.0 - 34.0 pg   MCHC 33.3 30.0 - 36.0 g/dL   RDW 12.9 11.5 - 15.5 %   Platelets 219 150 - 400 K/uL  Wet prep, genital     Status: Abnormal   Collection Time: 12/12/14  7:45 PM  Result Value Ref Range   Yeast Wet Prep HPF POC NONE SEEN NONE SEEN   Trich, Wet Prep NONE SEEN NONE SEEN   Clue Cells Wet Prep HPF POC NONE SEEN NONE SEEN   WBC, Wet Prep HPF POC FEW (A) NONE SEEN    US Transvaginal Non-ob  12/12/2014   CLINICAL DATA:  24 year old female with abdominal pain and irregular vaginal bleeding.  EXAM: TRANSABDOMINAL AND TRANSVAGINAL ULTRASOUND OF PELVIS  DOPPLER ULTRASOUND OF OVARIES  TECHNIQUE: Both transabdominal and transvaginal ultrasound examinations of the pelvis were performed. Transabdominal technique was performed for global imaging of the pelvis including uterus, ovaries, adnexal regions, and pelvic cul-de-sac.  It was necessary to proceed with endovaginal exam following the transabdominal exam to visualize the adnexa and endometrium. Color and duplex Doppler ultrasound was utilized to evaluate blood flow to the ovaries.  COMPARISON:  Pelvic  ultrasound 06/27/2014.  FINDINGS: Uterus  Measurements: 7.6 x 3.6 x 4.4 cm. No fibroids or other mass visualized.  Endometrium  Thickness: 12.1 mm.  No focal abnormality visualized.  Right ovary  Measurements: 4.6 x 2.5 x 2.2 cm. Normal appearance/no adnexal mass. Adjacent to the right ovary there is a tiny 1.5 x 1.4 x 1.2 cm anechoic lesion with increased through transmission, compatible with a simple paraovarian cyst.  Left ovary  Measurements: 4.6 x 2.4 x 2.3 cm. Normal appearance/no adnexal mass.  Pulsed Doppler evaluation of both ovaries demonstrates normal low-resistance arterial and venous waveforms.  Other findings  No significant free fluid in the cul-de-sac.  IMPRESSION: 1. No findings to account for the patient's history of pelvic pain or abnormal vaginal bleeding. 2. Small right-sided paraovarian cyst incidentally noted.   Electronically Signed   By: Vinnie Langton M.D.   On: 12/12/2014 20:45   US Pelvis Complete  12/12/2014   CLINICAL DATA:  24 year old female with abdominal pain and irregular vaginal bleeding.  EXAM: TRANSABDOMINAL AND TRANSVAGINAL ULTRASOUND OF PELVIS  DOPPLER ULTRASOUND OF OVARIES  TECHNIQUE: Both transabdominal and transvaginal ultrasound examinations of the pelvis were performed. Transabdominal technique was performed for global imaging of the pelvis including uterus, ovaries, adnexal regions, and pelvic cul-de-sac.  It was necessary to proceed with endovaginal exam following the transabdominal exam to visualize the adnexa and endometrium. Color and duplex Doppler ultrasound was utilized to evaluate blood flow to the ovaries.  COMPARISON:  Pelvic ultrasound 06/27/2014.  FINDINGS: Uterus  Measurements: 7.6 x 3.6 x 4.4 cm. No fibroids or other mass visualized.  Endometrium  Thickness: 12.1 mm.  No focal abnormality visualized.  Right ovary  Measurements: 4.6 x 2.5 x 2.2 cm. Normal appearance/no adnexal mass. Adjacent to the right ovary there is a tiny 1.5 x 1.4 x 1.2 cm anechoic  lesion with increased through transmission, compatible with a simple paraovarian cyst.  Left ovary  Measurements: 4.6 x 2.4 x 2.3 cm. Normal appearance/no adnexal mass.  Pulsed Doppler evaluation of both ovaries demonstrates normal low-resistance arterial and venous waveforms.  Other findings  No significant free fluid in the cul-de-sac.  IMPRESSION: 1. No findings to account for the patient's history of pelvic pain or abnormal vaginal bleeding. 2. Small right-sided paraovarian cyst incidentally noted.   Electronically Signed   By: Vinnie Langton M.D.   On: 12/12/2014 20:45   [place] Art/ven Flow Abd Pelv Doppler  12/12/2014   CLINICAL DATA:  24 year old female with abdominal pain and irregular vaginal bleeding.  EXAM: TRANSABDOMINAL AND TRANSVAGINAL ULTRASOUND OF PELVIS  DOPPLER ULTRASOUND OF OVARIES  TECHNIQUE: Both transabdominal and transvaginal ultrasound examinations of the pelvis were performed. Transabdominal technique was performed for global imaging of the pelvis including uterus, ovaries, adnexal regions, and pelvic cul-de-sac.  It was necessary to proceed with endovaginal exam following the transabdominal exam to visualize the adnexa and endometrium. Color and duplex Doppler ultrasound was utilized to evaluate blood flow to the ovaries.  COMPARISON:  Pelvic ultrasound 06/27/2014.  FINDINGS: Uterus  Measurements: 7.6 x 3.6 x 4.4 cm. No fibroids or other mass visualized.  Endometrium  Thickness: 12.1 mm.  No focal abnormality visualized.  Right ovary  Measurements: 4.6 x 2.5 x 2.2 cm. Normal appearance/no adnexal mass. Adjacent to the right ovary there is a tiny 1.5 x 1.4 x 1.2 cm anechoic lesion with increased through transmission, compatible with a simple paraovarian cyst.  Left ovary  Measurements: 4.6 x 2.4 x 2.3 cm. Normal appearance/no adnexal mass.  Pulsed Doppler evaluation of both ovaries demonstrates normal low-resistance arterial and venous waveforms.  Other findings  No significant free fluid in  the cul-de-sac.  IMPRESSION: 1. No findings to account for the patient's history of pelvic pain or abnormal vaginal bleeding. 2. Small right-sided paraovarian cyst incidentally noted.   Electronically Signed   By: Vinnie Langton M.D.   On: 12/12/2014 20:45    MAU Course  Procedures  None  MDM UA  UPT CBC Pelvic US with dopplers Toradol  60 mg  Wet prep GC  Patient awaiting [place]. Report given to Kerry Hough [facility] who resumes care of the patient.  Patient needs free pap   Noni Saupe, NP  2000 - Care assume from Noni Saupe, NP Patient states improvement in pain prior to discharge   Assessment and Plan  A: AUB Dysmenorrhea   P: Discharge home Patient given information about [facility] Pap Smear Clinic 12/31/14 in [place] Patient referred to [facility] 12/19/14 at 2:00 pm for further work-up of irregular bleeding Rx for Provera given to patient Patient advised to take Ibuprofen PRN pain Patient may return to MAU as needed or if her condition were to change or worsen   Luvenia Redden, PA-C  12/12/2014 9:12 PM

## 2014-12-12 NOTE — Progress Notes (Signed)
Pt states her pain is a little better, pt remains tearful , pt states she is tired of going through all of this.

## 2014-12-13 LAB — GC/CHLAMYDIA PROBE AMP (~~LOC~~) NOT AT ARMC
Chlamydia: NEGATIVE
Neisseria Gonorrhea: NEGATIVE

## 2014-12-14 LAB — URINE CULTURE: SPECIAL REQUESTS: NORMAL

## 2014-12-15 ENCOUNTER — Telehealth (HOSPITAL_COMMUNITY): Payer: Self-pay | Admitting: Obstetrics and Gynecology

## 2014-12-15 MED ORDER — SULFAMETHOXAZOLE-TRIMETHOPRIM 800-160 MG PO TABS: 1.0000 | ORAL_TABLET | Freq: Two times a day (BID) | ORAL | Status: AC

## 2014-12-15 NOTE — Telephone Encounter (Signed)
Medication called to pharmacy on chart. Bactrim for UTI/ + urine culture. message left on home phone.

## 2014-12-19 ENCOUNTER — Ambulatory Visit (INDEPENDENT_AMBULATORY_CARE_PROVIDER_SITE_OTHER): Payer: Self-pay | Admitting: Family Medicine

## 2014-12-19 ENCOUNTER — Encounter: Payer: Self-pay | Admitting: Family Medicine

## 2014-12-19 VITALS — BP 104/65 | HR 91 | Temp 98.2°F | Resp 20 | Ht 64.0 in | Wt 182.5 lb

## 2014-12-19 DIAGNOSIS — N921 Excessive and frequent menstruation with irregular cycle: Secondary | ICD-10-CM | POA: Insufficient documentation

## 2014-12-19 MED ORDER — MEGESTROL ACETATE 40 MG PO TABS: 40.0000 mg | ORAL_TABLET | Freq: Two times a day (BID) | ORAL | Status: DC

## 2014-12-19 NOTE — Assessment & Plan Note (Addendum)
Will perform D and C and see if this improves things. Switch to Megace in the mean time to see if this helps ameliorate her bleeding.Then restart her combination OC's to see if a reset will help. Risks include but are not limited to bleeding, infection, injury to surrounding structures, including bowel, bladder and ureters, blood clots, and death.  Likelihood of success is moderate.

## 2014-12-19 NOTE — Progress Notes (Signed)
Pt states she stopped taking the Metformin because it was causing nausea and some vomiting.

## 2014-12-19 NOTE — Progress Notes (Signed)
    Subjective:    Patient ID: Amber Ray is a 24 y.o. female presenting with Follow-up  on 12/19/2014  HPI: Pt. Reports heavy vaginal bleeding from end of December until now.  Placed on Provera which has not helped. Has h/o regular cycles for several years. Now has PCOS and skips periods and placed on Provera.  Has clotting with cycles and pain. Provera has not worked.  She is bleeding daily and is tearful and desires some form of definitive treatment.   Review of Systems  Constitutional: Negative for fever and chills.  Respiratory: Negative for shortness of breath.   Cardiovascular: Negative for chest pain.  Gastrointestinal: Negative for nausea, vomiting and abdominal pain.  Genitourinary: Negative for dysuria.  Skin: Negative for rash.      Objective:    BP 104/65 mmHg  Pulse 91  Temp(Src) 98.2 F (36.8 C) (Oral)  Resp 20  Ht 5\' 4"  (1.626 m)  Wt 182 lb 8 oz (82.781 kg)  BMI 31.31 kg/m2  LMP 11/04/2014 (Approximate) Physical Exam  Constitutional: She is oriented to person, place, and time. She appears well-developed and well-nourished. No distress.  HENT:  Head: Normocephalic and atraumatic.  Eyes: No scleral icterus.  Neck: Neck supple.  Cardiovascular: Normal rate.   Pulmonary/Chest: Effort normal.  Abdominal: Soft.  Neurological: She is alert and oriented to person, place, and time.  Skin: Skin is warm and dry.  Psychiatric: She has a normal mood and affect.        Assessment & Plan:   Problem List Items Addressed This Visit      Unprioritized   Menorrhagia with irregular cycle - Primary    Will perform D and C and see if this improves things. Switch to Megace in the mean time to see if this helps ameliorate her bleeding.Then restart her combination OC's to see if a reset will help. Risks include but are not limited to bleeding, infection, injury to surrounding structures, including bowel, bladder and ureters, blood clots, and death.  Likelihood of  success is moderate.       Relevant Medications   megestrol (MEGACE) tablet      Return in about 3 months (around 03/19/2015) for a follow-up.

## 2014-12-19 NOTE — Patient Instructions (Signed)
Polycystic Ovarian Syndrome Polycystic ovarian syndrome (PCOS) is a common hormonal disorder among women of reproductive age. Most women with PCOS grow many small cysts on their ovaries. PCOS can cause problems with your periods and make it difficult to get pregnant. It can also cause an increased risk of miscarriage with pregnancy. If left untreated, PCOS can lead to serious health problems, such as diabetes and heart disease. CAUSES The cause of PCOS is not fully understood, but genetics may be a factor. SIGNS AND SYMPTOMS   Infrequent or no menstrual periods.   Inability to get pregnant (infertility) because of not ovulating.   Increased growth of hair on the face, chest, stomach, back, thumbs, thighs, or toes.   Acne, oily skin, or dandruff.   Pelvic pain.   Weight gain or obesity, usually carrying extra weight around the waist.   Type 2 diabetes.   High cholesterol.   High blood pressure.   Female-pattern baldness or thinning hair.   Patches of thickened and dark brown or black skin on the neck, arms, breasts, or thighs.   Tiny excess flaps of skin (skin tags) in the armpits or neck area.   Excessive snoring and having breathing stop at times while asleep (sleep apnea).   Deepening of the voice.   Gestational diabetes when pregnant.  DIAGNOSIS  There is no single test to diagnose PCOS.   Your health care provider will:   Take a medical history.   Perform a pelvic exam.   Have ultrasonography done.   Check your female and female hormone levels.   Measure glucose or sugar levels in the blood.   Do other blood tests.   If you are producing too many female hormones, your health care provider will make sure it is from PCOS. At the physical exam, your health care provider will want to evaluate the areas of increased hair growth. Try to allow natural hair growth for a few days before the visit.   During a pelvic exam, the ovaries may be enlarged  or swollen because of the increased number of small cysts. This can be seen more easily by using vaginal ultrasonography or screening to examine the ovaries and lining of the uterus (endometrium) for cysts. The uterine lining may become thicker if you have not been having a regular period.  TREATMENT  Because there is no cure for PCOS, it needs to be managed to prevent problems. Treatments are based on your symptoms. Treatment is also based on whether you want to have a baby or whether you need contraception.  Treatment may include:   Progesterone hormone to start a menstrual period.   Birth control pills to make you have regular menstrual periods.   Medicines to make you ovulate, if you want to get pregnant.   Medicines to control your insulin.   Medicine to control your blood pressure.   Medicine and diet to control your high cholesterol and triglycerides in your blood.  Medicine to reduce excessive hair growth.  Surgery, making small holes in the ovary, to decrease the amount of female hormone production. This is done through a long, lighted tube (laparoscope) placed into the pelvis through a tiny incision in the lower abdomen.  HOME CARE INSTRUCTIONS  Only take over-the-counter or prescription medicine as directed by your health care provider.  Pay attention to the foods you eat and your activity levels. This can help reduce the effects of PCOS.  Keep your weight under control.  Eat foods that are   take over-the-counter or prescription medicine as directed by your health care provider.   Pay attention to the foods you eat and your activity levels. This can help reduce the effects of PCOS.   Keep your weight under control.   Eat foods that are low in carbohydrate and high in fiber.   Exercise regularly.  SEEK MEDICAL CARE IF:   Your symptoms do not get better with medicine.   You have new symptoms.  Document Released: 02/19/2005 Document Revised: 08/16/2013 Document Reviewed: 04/13/2013  Exit[employer] Patient Information 2015 [employer]. This information is not intended to replace advice given to you by your health care provider. Make sure you discuss any questions you have with your health care provider.

## 2015-01-08 NOTE — H&P (Signed)
  Amber Ray is an 24 y.o. G0P0000 Unknown female.   Chief Complaint: abnormal uterine bleeding HPI: Pt. Reports heavy vaginal bleeding from end of December until now. Placed on Provera which has not helped. Has h/o regular cycles for several years. Now has PCOS and skips periods and placed on Provera. Has clotting with cycles and pain. Provera has not worked. She is bleeding daily and is tearful and desires some form of definitive treatment.  Past Medical History  Diagnosis Date  . Anxiety   . Chlamydia   . Gonorrhea   . Depression   . Bipolar affective disorder, depressed, mild   . 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 Dermoid cyst   . PCOS (polycystic ovarian syndrome)     Past Surgical History  Procedure Laterality Date  . Laparoscopy  07/27/2012    Procedure: LAPAROSCOPY OPERATIVE;  Surgeon: Mora Bellman, MD;  Location: [facility];  Service: Gynecology;  Laterality: N/A;  . Ovarian cyst removal  07/27/2012    Procedure: OVARIAN CYSTECTOMY;  Surgeon: Mora Bellman, MD;  Location: [facility];  Service: Gynecology;  Laterality: Right;  Dermoid Cyst    Family History  Problem Relation Age of Onset  . Diabetes Maternal Grandmother   . Arthritis Maternal Grandmother   . Arthritis Maternal Grandfather   . Diabetes Paternal Grandmother   . Hypertension Paternal Grandmother   . Arthritis Paternal Grandmother   . Kidney disease Paternal Grandmother   . Arthritis Paternal Grandfather    Social History:  reports that she has been smoking Cigarettes.  She has a 1.5 pack-year smoking history. She has never used smokeless tobacco. She reports that she drinks about 0.5 oz of alcohol per week. She reports that she does not use illicit drugs.  Allergies: No Known Allergies  No current facility-administered medications on file prior to encounter.   Current Outpatient Prescriptions on File Prior to Encounter  Medication Sig Dispense Refill  . megestrol (MEGACE) 40 MG  tablet Take 1 tablet (40 mg total) by mouth 2 (two) times daily. 30 tablet 3  . metFORMIN (GLUCOPHAGE) 1000 MG tablet Take 1 tablet (1,000 mg total) by mouth 2 (two) times daily with a meal. 60 tablet 12    Pertinent items are noted in HPI.  There were no vitals taken for this visit. General appearance: alert, cooperative and appears stated age Head: Normocephalic, without obvious abnormality, atraumatic Neck: supple, symmetrical, trachea midline Lungs: normal effort Heart: regular rate and rhythm Abdomen: soft, non-tender; bowel sounds normal; no masses,  no organomegaly Extremities: extremities normal, atraumatic, no cyanosis or edema Skin: acanthosis nigricans at neck   Lab Results  Component Value Date   WBC 12.6* 12/12/2014   HGB 13.3 12/12/2014   HCT 39.9 12/12/2014   MCV 85.6 12/12/2014   PLT 219 12/12/2014   Lab Results  Component Value Date   PREGTESTUR NEGATIVE 12/12/2014     Assessment/Plan Patient Active Problem List   Diagnosis Date Noted  . Menorrhagia with irregular cycle 12/19/2014  . PCOS (polycystic ovarian syndrome) 08/21/2013  . Irregular menses 02/08/2013  . Dermoid cyst 07/06/2012   For D & C with hysteroscopy. Risks include but are not limited to bleeding, infection, injury to surrounding structures, including bowel, bladder and ureters, blood clots, and death.  Likelihood of success is high.   Tulsi Crossett S 01/08/2015, 8:46 AM

## 2015-01-09 ENCOUNTER — Encounter (HOSPITAL_COMMUNITY): Payer: Self-pay | Admitting: *Deleted

## 2015-01-30 ENCOUNTER — Ambulatory Visit (HOSPITAL_COMMUNITY): Payer: Self-pay | Admitting: Anesthesiology

## 2015-01-30 ENCOUNTER — Ambulatory Visit (HOSPITAL_COMMUNITY)
Admission: RE | Admit: 2015-01-30 | Discharge: 2015-01-30 | Disposition: A | Discharge status: Home / Self Care | Payer: Self-pay | Source: Ambulatory Visit | Attending: Family Medicine | Admitting: Family Medicine

## 2015-01-30 ENCOUNTER — Encounter (HOSPITAL_COMMUNITY): Payer: Self-pay | Admitting: *Deleted

## 2015-01-30 ENCOUNTER — Encounter (HOSPITAL_COMMUNITY)
Admission: RE | Disposition: A | Discharge status: Home / Self Care | Payer: Self-pay | Source: Ambulatory Visit | Attending: Family Medicine

## 2015-01-30 DIAGNOSIS — N920 Excessive and frequent menstruation with regular cycle: Secondary | ICD-10-CM | POA: Insufficient documentation

## 2015-01-30 DIAGNOSIS — N921 Excessive and frequent menstruation with irregular cycle: Secondary | ICD-10-CM | POA: Diagnosis present

## 2015-01-30 DIAGNOSIS — F17210 Nicotine dependence, cigarettes, uncomplicated: Secondary | ICD-10-CM | POA: Insufficient documentation

## 2015-01-30 DIAGNOSIS — F419 Anxiety disorder, unspecified: Secondary | ICD-10-CM | POA: Insufficient documentation

## 2015-01-30 DIAGNOSIS — N894 Leukoplakia of vagina: Secondary | ICD-10-CM | POA: Insufficient documentation

## 2015-01-30 DIAGNOSIS — F329 Major depressive disorder, single episode, unspecified: Secondary | ICD-10-CM | POA: Insufficient documentation

## 2015-01-30 DIAGNOSIS — N898 Other specified noninflammatory disorders of vagina: Secondary | ICD-10-CM

## 2015-01-30 DIAGNOSIS — E282 Polycystic ovarian syndrome: Secondary | ICD-10-CM | POA: Insufficient documentation

## 2015-01-30 DIAGNOSIS — F319 Bipolar disorder, unspecified: Secondary | ICD-10-CM | POA: Insufficient documentation

## 2015-01-30 HISTORY — PX: HYSTEROSCOPY WITH D & C: SHX1775

## 2015-01-30 LAB — CBC
HEMATOCRIT: 42.3 % (ref 36.0–46.0)
Hemoglobin: 14 g/dL (ref 12.0–15.0)
MCH: 28.5 pg (ref 26.0–34.0)
MCHC: 33.1 g/dL (ref 30.0–36.0)
MCV: 86 fL (ref 78.0–100.0)
PLATELETS: 267 10*3/uL (ref 150–400)
RBC: 4.92 MIL/uL (ref 3.87–5.11)
RDW: 13.4 % (ref 11.5–15.5)
WBC: 12.7 10*3/uL — ABNORMAL HIGH (ref 4.0–10.5)

## 2015-01-30 LAB — PREGNANCY, URINE: PREG TEST UR: NEGATIVE

## 2015-01-30 SURGERY — DILATATION AND CURETTAGE /HYSTEROSCOPY
Anesthesia: General | Site: Vagina

## 2015-01-30 MED ORDER — DEXAMETHASONE SODIUM PHOSPHATE 10 MG/ML IJ SOLN
INTRAMUSCULAR | Status: DC | PRN
  Administered 2015-01-30: 4 mg via INTRAVENOUS

## 2015-01-30 MED ORDER — SODIUM CHLORIDE 0.9 % IR SOLN
Status: DC | PRN
  Administered 2015-01-30: 3000 mL

## 2015-01-30 MED ORDER — ONDANSETRON HCL 4 MG/2ML IJ SOLN
INTRAMUSCULAR | Status: DC | PRN
  Administered 2015-01-30: 4 mg via INTRAVENOUS

## 2015-01-30 MED ORDER — FENTANYL CITRATE 0.05 MG/ML IJ SOLN
INTRAMUSCULAR | Status: AC
  Filled 2015-01-30: qty 2

## 2015-01-30 MED ORDER — SCOPOLAMINE 1 MG/3DAYS TD PT72
1.0000 | MEDICATED_PATCH | Freq: Once | TRANSDERMAL | Status: DC
  Administered 2015-01-30: 1.5 mg via TRANSDERMAL

## 2015-01-30 MED ORDER — BUPIVACAINE-EPINEPHRINE 0.25% -1:200000 IJ SOLN
INTRAMUSCULAR | Status: DC | PRN
  Administered 2015-01-30: 20 mL

## 2015-01-30 MED ORDER — LIDOCAINE HCL (CARDIAC) 20 MG/ML IV SOLN
INTRAVENOUS | Status: AC
  Filled 2015-01-30: qty 5

## 2015-01-30 MED ORDER — ONDANSETRON HCL 4 MG/2ML IJ SOLN: 4.0000 mg | Freq: Once | INTRAMUSCULAR | Status: DC | PRN

## 2015-01-30 MED ORDER — LACTATED RINGERS IV SOLN
INTRAVENOUS | Status: DC
  Administered 2015-01-30: 13:00:00 via INTRAVENOUS

## 2015-01-30 MED ORDER — PROPOFOL 10 MG/ML IV BOLUS
INTRAVENOUS | Status: AC
  Filled 2015-01-30: qty 20

## 2015-01-30 MED ORDER — PROPOFOL 10 MG/ML IV BOLUS
INTRAVENOUS | Status: DC | PRN
  Administered 2015-01-30: 200 mg via INTRAVENOUS

## 2015-01-30 MED ORDER — FENTANYL CITRATE 0.05 MG/ML IJ SOLN: 25.0000 ug | INTRAMUSCULAR | Status: DC | PRN

## 2015-01-30 MED ORDER — FENTANYL CITRATE 0.05 MG/ML IJ SOLN
INTRAMUSCULAR | Status: DC | PRN
  Administered 2015-01-30: 100 ug via INTRAVENOUS

## 2015-01-30 MED ORDER — ONDANSETRON HCL 4 MG/2ML IJ SOLN
INTRAMUSCULAR | Status: AC
  Filled 2015-01-30: qty 2

## 2015-01-30 MED ORDER — BUPIVACAINE HCL (PF) 0.25 % IJ SOLN
INTRAMUSCULAR | Status: AC
  Filled 2015-01-30: qty 30

## 2015-01-30 MED ORDER — KETOROLAC TROMETHAMINE 30 MG/ML IJ SOLN
INTRAMUSCULAR | Status: AC
  Filled 2015-01-30: qty 1

## 2015-01-30 MED ORDER — SILVER NITRATE-POT NITRATE 75-25 % EX MISC
CUTANEOUS | Status: AC
  Filled 2015-01-30: qty 1

## 2015-01-30 MED ORDER — LIDOCAINE HCL (CARDIAC) 20 MG/ML IV SOLN
INTRAVENOUS | Status: DC | PRN
  Administered 2015-01-30: 80 mg via INTRAVENOUS

## 2015-01-30 MED ORDER — SCOPOLAMINE 1 MG/3DAYS TD PT72
MEDICATED_PATCH | TRANSDERMAL | Status: AC
  Filled 2015-01-30: qty 1

## 2015-01-30 MED ORDER — LACTATED RINGERS IV SOLN
INTRAVENOUS | Status: DC
  Administered 2015-01-30: 12:00:00 via INTRAVENOUS

## 2015-01-30 MED ORDER — MIDAZOLAM HCL 2 MG/2ML IJ SOLN
INTRAMUSCULAR | Status: DC | PRN
  Administered 2015-01-30: 2 mg via INTRAVENOUS

## 2015-01-30 MED ORDER — KETOROLAC TROMETHAMINE 30 MG/ML IJ SOLN
INTRAMUSCULAR | Status: DC | PRN
  Administered 2015-01-30: 30 mg via INTRAVENOUS

## 2015-01-30 MED ORDER — IBUPROFEN 600 MG PO TABS: 600.0000 mg | ORAL_TABLET | Freq: Four times a day (QID) | ORAL | Status: DC | PRN

## 2015-01-30 MED ORDER — MIDAZOLAM HCL 2 MG/2ML IJ SOLN
INTRAMUSCULAR | Status: AC
  Filled 2015-01-30: qty 2

## 2015-01-30 MED ORDER — DEXAMETHASONE SODIUM PHOSPHATE 4 MG/ML IJ SOLN
INTRAMUSCULAR | Status: AC
  Filled 2015-01-30: qty 1

## 2015-01-30 SURGICAL SUPPLY — 17 items
CANISTER SUCT 3000ML (MISCELLANEOUS) ×3 IMPLANT
CATH ROBINSON RED A/P 16FR (CATHETERS) ×3 IMPLANT
CLOTH BEACON ORANGE TIMEOUT ST (SAFETY) ×3 IMPLANT
CONTAINER PREFILL 10% NBF 60ML (FORM) ×6 IMPLANT
ELECT REM PT RETURN 9FT ADLT (ELECTROSURGICAL)
ELECTRODE REM PT RTRN 9FT ADLT (ELECTROSURGICAL) IMPLANT
GLOVE BIOGEL PI IND STRL 7.0 (GLOVE) ×1 IMPLANT
GLOVE BIOGEL PI INDICATOR 7.0 (GLOVE) ×2
GLOVE ECLIPSE 7.0 STRL STRAW (GLOVE) ×6 IMPLANT
GOWN STRL REUS W/TWL LRG LVL3 (GOWN DISPOSABLE) ×9 IMPLANT
LOOP ANGLED CUTTING 22FR (CUTTING LOOP) IMPLANT
PACK VAGINAL MINOR WOMEN LF (CUSTOM PROCEDURE TRAY) ×3 IMPLANT
PAD OB MATERNITY 4.3X12.25 (PERSONAL CARE ITEMS) ×3 IMPLANT
TOWEL OR 17X24 6PK STRL BLUE (TOWEL DISPOSABLE) ×6 IMPLANT
TUBING AQUILEX INFLOW (TUBING) ×3 IMPLANT
TUBING AQUILEX OUTFLOW (TUBING) ×3 IMPLANT
WATER STERILE IRR 1000ML POUR (IV SOLUTION) ×3 IMPLANT

## 2015-01-30 NOTE — Op Note (Signed)
Preoperative diagnoses:  PCOS and heavy vaginal bleeding  Postoperative diagnosis: Same  Procedure: D & C, hysteroscopy, vaginal biopsy  Surgeon: Standley Dakins. Kennon Rounds, MD  Anesthesia: Hyman Hopes, MD  Findings: Fluffy endometrium, small bumps on cervix and vaginal walls  Fluid deficit: 84  Estimated blood loss: Minimum  Pathology: Endometrial currettings, vaginal biopsy  Indications for procedure: 24 y.o. G0P0000 with history of PCOS who has had daily bleeding for 3 months, unresponsive to Provera who presents for definitive treatment and possible diagnosis.  Procedure: The patient was taken to the operating room where spinal analgesia was inserted. SCDs were in place.  Time out was performed. Patient was placed in dorsal lithotomy in [place]. She was prepped and draped in the usual sterile fashion. A Red Rubber catheter was used to drain her bladder. A speculum was placeed in the vagina. The cervix was visualized anteriorly and grasped with a single-tooth tenaculum. Paracervical block was performed with 1% lidocaine plain with 20 cc injected. The uterus sounded to 8 cm. Sequential dilation was performed with Kennon Rounds dilators. The hysteroscope was inserted and the endometrial cavity and inspected. The above findings noted in the endometrial cavity with both ostia seen.  The hysteroscope was removed. Sharp curettage was performed in all 4 quadrants. Attention then turned to vagina, and biopsy of one of the bumps was taken and sent separately to pathology. All instruments were removed from the vagina. All instrument, needle and lap counts were correct x 2. The patient was awakened and is recovering in stable condition.  PRATT,TANYA S MD 01/30/2015 1:50 PM

## 2015-01-30 NOTE — Anesthesia Procedure Notes (Signed)
Procedure Name: LMA Insertion Date/Time: 01/30/2015 1:21 PM Performed by: Hewitt Blade Pre-anesthesia Checklist: Patient identified, Patient being monitored, Emergency Drugs available and Suction available Patient Re-evaluated:Patient Re-evaluated prior to inductionOxygen Delivery Method: Circle system utilized Intubation Type: IV induction LMA: LMA inserted LMA Size: 4.0 Number of attempts: 1 Placement Confirmation: positive ETCO2 and breath sounds checked- equal and bilateral Tube secured with: Tape Dental Injury: Teeth and Oropharynx as per pre-operative assessment

## 2015-01-30 NOTE — Interval H&P Note (Signed)
History and Physical Interval Note:  01/30/2015 1:01 PM  Amber Ray  has presented today for surgery, with the diagnosis of  DUB  The various methods of treatment have been discussed with the patient and family. After consideration of risks, benefits and other options for treatment, the patient has consented to  Procedure(s): DILATATION AND CURETTAGE /HYSTEROSCOPY (N/A) as a surgical intervention .  The patient's history has been reviewed, patient examined, no change in status, stable for surgery.  I have reviewed the patient's chart and labs.  Questions were answered to the patient's satisfaction.     [place]

## 2015-01-30 NOTE — Anesthesia Preprocedure Evaluation (Signed)
Anesthesia Evaluation  Patient identified by MRN, date of birth, ID band Patient awake    Reviewed: Allergy & Precautions, NPO status , Patient's Chart, lab work & pertinent test results  History of Anesthesia Complications Negative for: history of anesthetic complications  Airway Mallampati: II  TM Distance: >3 FB Neck ROM: Full    Dental no notable dental hx. (+) Dental Advisory Given   Pulmonary Current Smoker,  breath sounds clear to auscultation  Pulmonary exam normal       Cardiovascular negative cardio ROS  Rhythm:Regular Rate:Normal     Neuro/Psych PSYCHIATRIC DISORDERS Anxiety Depression Bipolar Disorder negative neurological ROS     GI/Hepatic negative GI ROS, Neg liver ROS,   Endo/Other  negative endocrine ROS  Renal/GU negative Renal ROS  negative genitourinary   Musculoskeletal negative musculoskeletal ROS (+)   Abdominal   Peds negative pediatric ROS (+)  Hematology negative hematology ROS (+)   Anesthesia Other Findings   Reproductive/Obstetrics negative OB ROS                             Anesthesia Physical Anesthesia Plan  ASA: II  Anesthesia Plan: General   Post-op Pain Management:    Induction: Intravenous  Airway Management Planned: LMA  Additional Equipment:   Intra-op Plan:   Post-operative Plan: Extubation in OR  Informed Consent: I have reviewed the patients History and Physical, chart, labs and discussed the procedure including the risks, benefits and alternatives for the proposed anesthesia with the patient or authorized representative who has indicated his/her understanding and acceptance.   Dental advisory given  Plan Discussed with: CRNA  Anesthesia Plan Comments:         Anesthesia Quick Evaluation

## 2015-01-30 NOTE — Anesthesia Postprocedure Evaluation (Signed)
  Anesthesia Post-op Note  Patient: Amber Ray  Procedure(s) Performed: Procedure(s) (LRB): DILATATION AND CURETTAGE /HYSTEROSCOPY (N/A)  Patient Location: PACU  Anesthesia Type: General  Level of Consciousness: awake and alert   Airway and Oxygen Therapy: Patient Spontanous Breathing  Post-op Pain: mild  Post-op Assessment: Post-op Vital signs reviewed, Patient's Cardiovascular Status Stable, Respiratory Function Stable, Patent Airway and No signs of Nausea or vomiting  Last Vitals:  Filed Vitals:   01/30/15 1425  BP: 109/72  Pulse: 77  Temp: 36.9 C  Resp: 20    Post-op Vital Signs: stable   Complications: No apparent anesthesia complications

## 2015-01-30 NOTE — Discharge Instructions (Signed)
DISCHARGE INSTRUCTIONS: HYSTEROSCOPY / ENDOMETRIAL ABLATION The following instructions have been prepared to help you care for yourself upon your return home.  May Remove Scop patch on or before 02/01/2015.  May take Ibuprofen after 6:38 p.m. as needed for cramps.  Personal hygiene:  Use sanitary pads for vaginal drainage, not tampons.  Shower the day after your procedure.  NO tub baths, pools or Jacuzzis for 2-3 weeks.  Wipe front to back after using the bathroom.  Activity and limitations:  Do NOT drive or operate any equipment for 24 hours. The effects of anesthesia are still present and drowsiness may result.  Do NOT rest in bed all day.  Walking is encouraged.  Walk up and down stairs slowly.  You may resume your normal activity in one to two days or as indicated by your physician. Sexual activity: NO intercourse for at least 2 weeks after the procedure, or as indicated by your Doctor.  Diet: Eat a light meal as desired this evening. You may resume your usual diet tomorrow.  Return to Work: You may resume your work activities in one to two days or as indicated by Marine scientist.  What to expect after your surgery: Expect to have vaginal bleeding/discharge for 2-3 days and spotting for up to 10 days. It is not unusual to have soreness for up to 1-2 weeks. You may have a slight burning sensation when you urinate for the first day. Mild cramps may continue for a couple of days. You may have a regular period in 2-6 weeks.  Call your doctor for any of the following:  Excessive vaginal bleeding or clotting, saturating and changing one pad every hour.  Inability to urinate 6 hours after discharge from hospital.  Pain not relieved by pain medication.  Fever of 100.4 F or greater.  Unusual vaginal discharge or odor.   [facility] [phone]

## 2015-01-30 NOTE — Transfer of Care (Signed)
Immediate Anesthesia Transfer of Care Note  Patient: Amber Ray  Procedure(s) Performed: Procedure(s): DILATATION AND CURETTAGE /HYSTEROSCOPY (N/A)  Patient Location: PACU  Anesthesia Type:General  Level of Consciousness: awake, alert  and oriented  Airway & Oxygen Therapy: Patient Spontanous Breathing and Patient connected to face mask oxygen  Post-op Assessment: Report given to RN, Post -op Vital signs reviewed and stable and Patient moving all extremities  Post vital signs: Reviewed and stable  Last Vitals:  Filed Vitals:   01/30/15 1205  BP: 116/72  Pulse: 87  Temp: 36.9 C  Resp: 18    Complications: No apparent anesthesia complications

## 2015-02-01 ENCOUNTER — Encounter (HOSPITAL_COMMUNITY): Payer: Self-pay | Admitting: Family Medicine

## 2015-02-02 ENCOUNTER — Encounter (HOSPITAL_COMMUNITY): Payer: Self-pay | Admitting: Emergency Medicine

## 2015-02-02 ENCOUNTER — Emergency Department (HOSPITAL_COMMUNITY)
Admission: EM | Admit: 2015-02-02 | Discharge: 2015-02-02 | Disposition: A | Discharge status: Home / Self Care | Payer: Self-pay | Attending: Emergency Medicine | Admitting: Emergency Medicine

## 2015-02-02 DIAGNOSIS — Z8639 Personal history of other endocrine, nutritional and metabolic disease: Secondary | ICD-10-CM | POA: Insufficient documentation

## 2015-02-02 DIAGNOSIS — G8918 Other acute postprocedural pain: Secondary | ICD-10-CM | POA: Insufficient documentation

## 2015-02-02 DIAGNOSIS — Z86018 Personal history of other benign neoplasm: Secondary | ICD-10-CM | POA: Insufficient documentation

## 2015-02-02 DIAGNOSIS — Z8659 Personal history of other mental and behavioral disorders: Secondary | ICD-10-CM | POA: Insufficient documentation

## 2015-02-02 DIAGNOSIS — Z720 Tobacco use: Secondary | ICD-10-CM | POA: Insufficient documentation

## 2015-02-02 DIAGNOSIS — Z8619 Personal history of other infectious and parasitic diseases: Secondary | ICD-10-CM | POA: Insufficient documentation

## 2015-02-02 DIAGNOSIS — Z79899 Other long term (current) drug therapy: Secondary | ICD-10-CM | POA: Insufficient documentation

## 2015-02-02 MED ORDER — HYDROCODONE-ACETAMINOPHEN 5-325 MG PO TABS: 1.0000 | ORAL_TABLET | ORAL | Status: DC | PRN

## 2015-02-02 NOTE — ED Notes (Signed)
MD at bedside. 

## 2015-02-02 NOTE — ED Notes (Signed)
Pt. Ambulatory to restroom.

## 2015-02-02 NOTE — Discharge Instructions (Signed)
Follow-up with Dr. Kennon Rounds. Return for discharge or fevers. Return for increasing weakness.

## 2015-02-02 NOTE — ED Provider Notes (Signed)
CSN: [id number]     Arrival date & time 02/02/15  1327 History   This chart was scribed for Davonna Belling, MD by Edison Simon, ED Scribe. This patient was seen in room APA02/APA02 and the patient's care was started at 3:07 PM.    Chief Complaint  Patient presents with  . Post-op Problem   The history is provided by the patient. No language interpreter was used.    HPI Comments: Amber Ray is a 24 y.o. female who presents to the Emergency Department complaining of groin pain with onset after D&C 3 days ago which was performed by Dr. Kennon Rounds for heavy bleeding. Records indicate that biopsy revealed no evidence of cancer. She states the procedure went well. She states she began feeling the groin pain the same day of the procedure and has been persistent since then; she states the severity has not changed. She describes it as aching and sharp. She states that 2 days ago, she developed weakness in her left leg, left hip pain, numbness/tingling in foot, and pain shooting down the leg. She denies similar symptoms previously. Anesthesia for the procedure was local. She states she had some back pain yesterday as well. She states she has not been using anything for pain besides Ibuprofen which has not improved her pain. She states vaginal bleeding stopped yesterday. She had some nausea right after getting home after the procedure that has resolved. She denies fever or vomiting.   Past Medical History  Diagnosis Date  . Anxiety   . Chlamydia   . Gonorrhea   . Depression   . Bipolar affective disorder, depressed, mild   . 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 Dermoid cyst   . PCOS (polycystic ovarian syndrome)    Past Surgical History  Procedure Laterality Date  . Laparoscopy  07/27/2012    Procedure: LAPAROSCOPY OPERATIVE;  Surgeon: Mora Bellman, MD;  Location: [facility];  Service: Gynecology;  Laterality: N/A;  . Ovarian cyst removal  07/27/2012    Procedure: OVARIAN  CYSTECTOMY;  Surgeon: Mora Bellman, MD;  Location: [facility];  Service: Gynecology;  Laterality: Right;  Dermoid Cyst  . Hysteroscopy w/d&c N/A 01/30/2015    Procedure: DILATATION AND CURETTAGE /HYSTEROSCOPY;  Surgeon: Donnamae Jude, MD;  Location: [facility];  Service: Gynecology;  Laterality: N/A;   Family History  Problem Relation Age of Onset  . Diabetes Maternal Grandmother   . Arthritis Maternal Grandmother   . Arthritis Maternal Grandfather   . Diabetes Paternal Grandmother   . Hypertension Paternal Grandmother   . Arthritis Paternal Grandmother   . Kidney disease Paternal Grandmother   . Arthritis Paternal Grandfather    History  Substance Use Topics  . Smoking status: Current Every Day Smoker -- 0.25 packs/day for 6 years    Types: Cigarettes  . Smokeless tobacco: Never Used  . Alcohol Use: 0.5 oz/week    1 Standard drinks or equivalent per week     Comment: occasionally   OB History    Gravida Para Term Preterm AB TAB SAB Ectopic Multiple Living   0    0  0   0     Review of Systems  Genitourinary: Positive for pelvic pain.  Musculoskeletal: Positive for back pain.  Neurological: Positive for weakness and numbness.  All other systems reviewed and are negative.     Allergies  Review of patient's allergies indicates no known allergies.  Home Medications  Prior to Admission medications   Medication Sig Start Date End Date Taking? Authorizing Provider  ibuprofen (ADVIL,MOTRIN) 600 MG tablet Take 1 tablet (600 mg total) by mouth every 6 (six) hours as needed. 01/30/15  Yes Donnamae Jude, MD  megestrol (MEGACE) 40 MG tablet Take 1 tablet (40 mg total) by mouth 2 (two) times daily. 12/19/14  Yes Donnamae Jude, MD  metFORMIN (GLUCOPHAGE) 1000 MG tablet Take 1 tablet (1,000 mg total) by mouth 2 (two) times daily with a meal. 07/31/14  Yes Lavonia Drafts, MD   BP 113/72 mmHg  Pulse 79  Temp(Src) 98.7 F (37.1 C) (Oral)  Resp 13  Ht 5\' 4"  (1.626 m)  Wt 196 lb  (88.905 kg)  BMI 33.63 kg/m2  SpO2 100%  LMP 12/10/2014 Physical Exam  Constitutional: She is oriented to person, place, and time. She appears well-developed and well-nourished.  HENT:  Head: Normocephalic and atraumatic.  Eyes: Conjunctivae are normal.  Neck: Normal range of motion. Neck supple.  Cardiovascular: Normal rate, regular rhythm and normal heart sounds.   No murmur heard. Pulmonary/Chest: Effort normal and breath sounds normal. No respiratory distress. She has no wheezes. She has no rales.  Abdominal: Soft.  Abdomen mildly tender diffusely, worst over lower suprapubic and LLQ No ecchymosis  Musculoskeletal: Normal range of motion.  Good pulse in left foot, sensation intact medially and laterally No lumbar or SI tenderness  Neurological: She is alert and oriented to person, place, and time.  Normal neurological exam Right leg has good flexion and extension at ankle, knee, and hip There is somewhat decreased flexion and extension at left ankle with 4+ strength Mildly decreased strength at left knee with flexion and extension Decreased flexion and extension at left hip  Skin: Skin is warm and dry.  Psychiatric: She has a normal mood and affect.  Nursing note and vitals reviewed.   ED Course  Procedures (including critical care time)  DIAGNOSTIC STUDIES: Oxygen Saturation is 100% on room air, normal by my interpretation.    COORDINATION OF CARE: 3:16 PM Discussed treatment plan with patient at beside, the patient agrees with the plan and has no further questions at this time.   Labs Review Labs Reviewed - No data to display  Imaging Review No results found.   EKG Interpretation None      MDM   Final diagnoses:  None    Patient with abdominal pain post D and C. Discussed with GYN on call. Doubt perforation. Patient did not have a spinal. Could be weakness but appears to be more related to pain. Some abdominal tenderness but no discharge. No fevers.  After discussion with the gynecologist on call for [facility] clinic will increase pain medicines and have follow-up on Monday as needed.  I personally performed the services described in this documentation, which was scribed in my presence. The recorded information has been reviewed and is accurate.    Davonna Belling, MD 02/02/15 [phone]

## 2015-02-02 NOTE — ED Notes (Signed)
Pt reports pain that started in her groin after a D&C on Wed. Pt states the pain has become progressively worse and she is having difficulty raising her L leg.

## 2015-02-13 ENCOUNTER — Ambulatory Visit (INDEPENDENT_AMBULATORY_CARE_PROVIDER_SITE_OTHER): Payer: Self-pay | Admitting: Family Medicine

## 2015-02-13 ENCOUNTER — Encounter: Payer: Self-pay | Admitting: Family Medicine

## 2015-02-13 VITALS — BP 119/74 | HR 81 | Temp 98.3°F | Ht 64.0 in | Wt 186.9 lb

## 2015-02-13 DIAGNOSIS — Z30011 Encounter for initial prescription of contraceptive pills: Secondary | ICD-10-CM

## 2015-02-13 DIAGNOSIS — E282 Polycystic ovarian syndrome: Secondary | ICD-10-CM

## 2015-02-13 DIAGNOSIS — Z9889 Other specified postprocedural states: Secondary | ICD-10-CM

## 2015-02-13 MED ORDER — NORGESTIMATE-ETH ESTRADIOL 0.25-35 MG-MCG PO TABS: 1.0000 | ORAL_TABLET | Freq: Every day | ORAL | Status: DC

## 2015-02-13 NOTE — Patient Instructions (Signed)
Oral Contraception Information Oral contraceptive pills (OCPs) are medicines taken to prevent pregnancy. OCPs work by preventing the ovaries from releasing eggs. The hormones in OCPs also cause the cervical mucus to thicken, preventing the sperm from entering the uterus. The hormones also cause the uterine lining to become thin, not allowing a fertilized egg to attach to the inside of the uterus. OCPs are highly effective when taken exactly as prescribed. However, OCPs do not prevent sexually transmitted diseases (STDs). Safe sex practices, such as using condoms along with the pill, can help prevent STDs.  Before taking the pill, you may have a physical exam and Pap test. Your health care provider may order blood tests. The health care provider will make sure you are a good candidate for oral contraception. Discuss with your health care provider the possible side effects of the OCP you may be prescribed. When starting an OCP, it can take 2 to 3 months for the body to adjust to the changes in hormone levels in your body.  TYPES OF ORAL CONTRACEPTION  The combination pill--This pill contains estrogen and progestin (synthetic progesterone) hormones. The combination pill comes in 21-day, 28-day, or 91-day packs. Some types of combination pills are meant to be taken continuously (365-day pills). With 21-day packs, you do not take pills for 7 days after the last pill. With 28-day packs, the pill is taken every day. The last 7 pills are without hormones. Certain types of pills have more than 21 hormone-containing pills. With 91-day packs, the first 84 pills contain both hormones, and the last 7 pills contain no hormones or contain estrogen only.  The minipill--This pill contains the progesterone hormone only. The pill is taken every day continuously. It is very important to take the pill at the same time each day. The minipill comes in packs of 28 pills. All 28 pills contain the hormone.  ADVANTAGES OF ORAL  CONTRACEPTIVE PILLS  Decreases premenstrual symptoms.   Treats menstrual period cramps.   Regulates the menstrual cycle.   Decreases a heavy menstrual flow.   May treatacne, depending on the type of pill.   Treats abnormal uterine bleeding.   Treats polycystic ovarian syndrome.   Treats endometriosis.   Can be used as emergency contraception.  THINGS THAT CAN MAKE ORAL CONTRACEPTIVE PILLS LESS EFFECTIVE OCPs can be less effective if:   You forget to take the pill at the same time every day.   You have a stomach or intestinal disease that lessens the absorption of the pill.   You take OCPs with other medicines that make OCPs less effective, such as antibiotics, certain HIV medicines, and some seizure medicines.   You take expired OCPs.   You forget to restart the pill on day 7, when using the packs of 21 pills.  RISKS ASSOCIATED WITH ORAL CONTRACEPTIVE PILLS  Oral contraceptive pills can sometimes cause side effects, such as:  Headache.  Nausea.  Breast tenderness.  Irregular bleeding or spotting. Combination pills are also associated with a small increased risk of:  Blood clots.  Heart attack.  Stroke. Document Released: 01/16/2003 Document Revised: 08/16/2013 Document Reviewed: 04/16/2013 [facility] Patient Information 2015 [place], [place]. This information is not intended to replace advice given to you by your health care provider. Make sure you discuss any questions you have with your health care provider. Polycystic Ovarian Syndrome Polycystic ovarian syndrome (PCOS) is a common hormonal disorder among women of reproductive age. Most women with PCOS grow many small cysts on their ovaries.  PCOS can cause problems with your periods and make it difficult to get pregnant. It can also cause an increased risk of miscarriage with pregnancy. If left untreated, PCOS can lead to serious health problems, such as diabetes and heart disease. CAUSES The cause  of PCOS is not fully understood, but genetics may be a factor. SIGNS AND SYMPTOMS   Infrequent or no menstrual periods.   Inability to get pregnant (infertility) because of not ovulating.   Increased growth of hair on the face, chest, stomach, back, thumbs, thighs, or toes.   Acne, oily skin, or dandruff.   Pelvic pain.   Weight gain or obesity, usually carrying extra weight around the waist.   Type 2 diabetes.   High cholesterol.   High blood pressure.   Female-pattern baldness or thinning hair.   Patches of thickened and dark brown or black skin on the neck, arms, breasts, or thighs.   Tiny excess flaps of skin (skin tags) in the armpits or neck area.   Excessive snoring and having breathing stop at times while asleep (sleep apnea).   Deepening of the voice.   Gestational diabetes when pregnant.  DIAGNOSIS  There is no single test to diagnose PCOS.   Your health care provider will:   Take a medical history.   Perform a pelvic exam.   Have ultrasonography done.   Check your female and female hormone levels.   Measure glucose or sugar levels in the blood.   Do other blood tests.   If you are producing too many female hormones, your health care provider will make sure it is from PCOS. At the physical exam, your health care provider will want to evaluate the areas of increased hair growth. Try to allow natural hair growth for a few days before the visit.   During a pelvic exam, the ovaries may be enlarged or swollen because of the increased number of small cysts. This can be seen more easily by using vaginal ultrasonography or screening to examine the ovaries and lining of the uterus (endometrium) for cysts. The uterine lining may become thicker if you have not been having a regular period.  TREATMENT  Because there is no cure for PCOS, it needs to be managed to prevent problems. Treatments are based on your symptoms. Treatment is also based on  whether you want to have a baby or whether you need contraception.  Treatment may include:   Progesterone hormone to start a menstrual period.   Birth control pills to make you have regular menstrual periods.   Medicines to make you ovulate, if you want to get pregnant.   Medicines to control your insulin.   Medicine to control your blood pressure.   Medicine and diet to control your high cholesterol and triglycerides in your blood.  Medicine to reduce excessive hair growth.  Surgery, making small holes in the ovary, to decrease the amount of female hormone production. This is done through a long, lighted tube (laparoscope) placed into the pelvis through a tiny incision in the lower abdomen.  HOME CARE INSTRUCTIONS  Only take over-the-counter or prescription medicine as directed by your health care provider.  Pay attention to the foods you eat and your activity levels. This can help reduce the effects of PCOS.  Keep your weight under control.  Eat foods that are low in carbohydrate and high in fiber.  Exercise regularly. SEEK MEDICAL CARE IF:  Your symptoms do not get better with medicine.  You have new  symptoms. Document Released: 02/19/2005 Document Revised: 08/16/2013 Document Reviewed: 04/13/2013 [facility] Patient Information 2015 [place], [place]. This information is not intended to replace advice given to you by your health care provider. Make sure you discuss any questions you have with your health care provider.

## 2015-02-13 NOTE — Assessment & Plan Note (Signed)
Begin OC's

## 2015-02-13 NOTE — Progress Notes (Signed)
    Subjective:    Patient ID: Amber Ray is a 24 y.o. female presenting with Routine Post Op  on 02/13/2015  HPI: S/p D and C.  Stopped bleeding finally. Pathology reviewed.  It was negative.  Review of Systems  Constitutional: Negative for fever and chills.  Respiratory: Negative for shortness of breath.   Cardiovascular: Negative for chest pain.  Gastrointestinal: Negative for abdominal pain.      Objective:    BP 119/74 mmHg  Pulse 81  Temp(Src) 98.3 F (36.8 C)  Ht 5\' 4"  (1.626 m)  Wt 186 lb 14.4 oz (84.777 kg)  BMI 32.07 kg/m2  LMP 12/10/2014 Physical Exam  Constitutional: She is oriented to person, place, and time. She appears well-developed and well-nourished.  Cardiovascular: Normal rate.   Pulmonary/Chest: Effort normal.  Abdominal: Soft.  Neurological: She is alert and oriented to person, place, and time.  Psychiatric: She has a normal mood and affect.  Vitals reviewed.       Assessment & Plan:   Problem List Items Addressed This Visit      Unprioritized   PCOS (polycystic ovarian syndrome)    Begin OC's       Other Visit Diagnoses    Encounter for initial prescription of contraceptive pills    -  Primary    Relevant Medications    Sprintec 1 tab PO daily       Return in about 6 months (around 08/15/2015), or if symptoms worsen or fail to improve.  Amber Ray S 02/13/2015 4:39 PM

## 2015-03-19 ENCOUNTER — Encounter (HOSPITAL_COMMUNITY): Payer: Self-pay | Admitting: *Deleted

## 2015-03-19 ENCOUNTER — Emergency Department (HOSPITAL_COMMUNITY)
Admission: EM | Admit: 2015-03-19 | Discharge: 2015-03-19 | Disposition: A | Discharge status: Home / Self Care | Payer: Self-pay | Attending: Emergency Medicine | Admitting: Emergency Medicine

## 2015-03-19 DIAGNOSIS — Z8659 Personal history of other mental and behavioral disorders: Secondary | ICD-10-CM | POA: Insufficient documentation

## 2015-03-19 DIAGNOSIS — E282 Polycystic ovarian syndrome: Secondary | ICD-10-CM | POA: Insufficient documentation

## 2015-03-19 DIAGNOSIS — R197 Diarrhea, unspecified: Secondary | ICD-10-CM | POA: Insufficient documentation

## 2015-03-19 DIAGNOSIS — Z8619 Personal history of other infectious and parasitic diseases: Secondary | ICD-10-CM | POA: Insufficient documentation

## 2015-03-19 DIAGNOSIS — Z86018 Personal history of other benign neoplasm: Secondary | ICD-10-CM | POA: Insufficient documentation

## 2015-03-19 DIAGNOSIS — R112 Nausea with vomiting, unspecified: Secondary | ICD-10-CM | POA: Insufficient documentation

## 2015-03-19 DIAGNOSIS — Z793 Long term (current) use of hormonal contraceptives: Secondary | ICD-10-CM | POA: Insufficient documentation

## 2015-03-19 DIAGNOSIS — Z79899 Other long term (current) drug therapy: Secondary | ICD-10-CM | POA: Insufficient documentation

## 2015-03-19 DIAGNOSIS — Z3202 Encounter for pregnancy test, result negative: Secondary | ICD-10-CM | POA: Insufficient documentation

## 2015-03-19 DIAGNOSIS — Z720 Tobacco use: Secondary | ICD-10-CM | POA: Insufficient documentation

## 2015-03-19 LAB — I-STAT CHEM 8, ED
BUN: 5 mg/dL — ABNORMAL LOW (ref 6–20)
CALCIUM ION: 1.19 mmol/L (ref 1.12–1.23)
Chloride: 103 mmol/L (ref 101–111)
Creatinine, Ser: 0.7 mg/dL (ref 0.44–1.00)
Glucose, Bld: 91 mg/dL (ref 70–99)
HCT: 41 % (ref 36.0–46.0)
HEMOGLOBIN: 13.9 g/dL (ref 12.0–15.0)
Potassium: 3.7 mmol/L (ref 3.5–5.1)
Sodium: 139 mmol/L (ref 135–145)
TCO2: 21 mmol/L (ref 0–100)

## 2015-03-19 LAB — I-STAT BETA HCG BLOOD, ED (MC, WL, AP ONLY)

## 2015-03-19 MED ORDER — ONDANSETRON HCL 8 MG PO TABS
8.0000 mg | ORAL_TABLET | Freq: Three times a day (TID) | ORAL | Status: DC | PRN
Start: 2015-03-19 — End: 2015-04-24

## 2015-03-19 MED ORDER — ONDANSETRON HCL 4 MG/2ML IJ SOLN
4.0000 mg | Freq: Once | INTRAMUSCULAR | Status: AC
  Administered 2015-03-19: 4 mg via INTRAVENOUS
  Filled 2015-03-19: qty 2

## 2015-03-19 MED ORDER — SODIUM CHLORIDE 0.9 % IV BOLUS (SEPSIS)
1000.0000 mL | Freq: Once | INTRAVENOUS | Status: AC
  Administered 2015-03-19: 1000 mL via INTRAVENOUS

## 2015-03-19 NOTE — ED Provider Notes (Signed)
CSN: [id number]     Arrival date & time 03/19/15  1917 History   First MD Initiated Contact with Patient 03/19/15 1931     Chief Complaint  Patient presents with  . Emesis     (Consider location/radiation/quality/duration/timing/severity/associated sxs/prior Treatment) Patient is a 24 y.o. female presenting with vomiting. The history is provided by the patient.  Emesis  Amber Ray is a 24 y.o. female who presents for evaluation of nausea, vomiting and diarrhea. Symptoms started today. She denies fever, chills, cough, shortness of breath or chest pain. No known sick contacts. She takes metformin for polycystic ovarian syndrome. She does not have diabetes. 69 to work because of these symptoms. There are no other known modifying factors.  Past Medical History  Diagnosis Date  . Anxiety   . Chlamydia   . Gonorrhea   . Depression   . Bipolar affective disorder, depressed, mild   . 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 Dermoid cyst   . PCOS (polycystic ovarian syndrome)    Past Surgical History  Procedure Laterality Date  . Laparoscopy  07/27/2012    Procedure: LAPAROSCOPY OPERATIVE;  Surgeon: Mora Bellman, MD;  Location: [facility];  Service: Gynecology;  Laterality: N/A;  . Ovarian cyst removal  07/27/2012    Procedure: OVARIAN CYSTECTOMY;  Surgeon: Mora Bellman, MD;  Location: [facility];  Service: Gynecology;  Laterality: Right;  Dermoid Cyst  . Hysteroscopy w/d&c N/A 01/30/2015    Procedure: DILATATION AND CURETTAGE /HYSTEROSCOPY;  Surgeon: Donnamae Jude, MD;  Location: [facility];  Service: Gynecology;  Laterality: N/A;   Family History  Problem Relation Age of Onset  . Diabetes Maternal Grandmother   . Arthritis Maternal Grandmother   . Arthritis Maternal Grandfather   . Diabetes Paternal Grandmother   . Hypertension Paternal Grandmother   . Arthritis Paternal Grandmother   . Kidney disease Paternal Grandmother   . Arthritis Paternal Grandfather    History   Substance Use Topics  . Smoking status: Current Every Day Smoker -- 0.25 packs/day for 6 years    Types: Cigarettes  . Smokeless tobacco: Never Used  . Alcohol Use: 0.5 oz/week    1 Standard drinks or equivalent per week     Comment: occasionally   OB History    Gravida Para Term Preterm AB TAB SAB Ectopic Multiple Living   0    0  0   0     Review of Systems  Gastrointestinal: Positive for vomiting.  All other systems reviewed and are negative.     Allergies  Review of patient's allergies indicates no known allergies.  Home Medications   Prior to Admission medications   Medication Sig Start Date End Date Taking? Authorizing Provider  ibuprofen (ADVIL,MOTRIN) 600 MG tablet Take 1 tablet (600 mg total) by mouth every 6 (six) hours as needed. 01/30/15   Donnamae Jude, MD  metFORMIN (GLUCOPHAGE) 1000 MG tablet Take 1 tablet (1,000 mg total) by mouth 2 (two) times daily with a meal. 07/31/14   Lavonia Drafts, MD  norgestimate-ethinyl estradiol (ORTHO-CYCLEN,SPRINTEC,PREVIFEM) 0.25-35 MG-MCG tablet Take 1 tablet by mouth daily. 02/13/15   Donnamae Jude, MD  ondansetron (ZOFRAN) 8 MG tablet Take 1 tablet (8 mg total) by mouth every 8 (eight) hours as needed for nausea or vomiting. 03/19/15   Daleen Bo, MD   BP 116/76 mmHg  Pulse 99  Temp(Src) 99 F (37.2 C) (Oral)  Resp 20  Ht  5\' 4"  (1.626 m)  Wt 189 lb (85.73 kg)  BMI 32.43 kg/m2  SpO2 99%  LMP 02/17/2015 (Approximate) Physical Exam  Constitutional: She is oriented to person, place, and time. She appears well-developed and well-nourished.  HENT:  Head: Normocephalic and atraumatic.  Right Ear: External ear normal.  Left Ear: External ear normal.  Eyes: Conjunctivae and EOM are normal. Pupils are equal, round, and reactive to light.  Neck: Normal range of motion and phonation normal. Neck supple.  Cardiovascular: Normal rate, regular rhythm and normal heart sounds.   Pulmonary/Chest: Effort normal and breath  sounds normal. No respiratory distress. She exhibits no bony tenderness.  Abdominal: Soft. She exhibits no mass. There is no tenderness. There is no rebound and no guarding.  Musculoskeletal: Normal range of motion.  Neurological: She is alert and oriented to person, place, and time. No cranial nerve deficit or sensory deficit. She exhibits normal muscle tone. Coordination normal.  Skin: Skin is warm, dry and intact.  Psychiatric: She has a normal mood and affect. Her behavior is normal. Judgment and thought content normal.  Nursing note and vitals reviewed.   ED Course  Procedures (including critical care time)  Medications  sodium chloride 0.9 % bolus 1,000 mL (1,000 mLs Intravenous New Bag/Given 03/19/15 1954)  ondansetron (ZOFRAN) injection 4 mg (4 mg Intravenous Given 03/19/15 1954)    Patient Vitals for the past 24 hrs:  BP Temp Temp src Pulse Resp SpO2 Height Weight  03/19/15 1927 116/76 mmHg 99 F (37.2 C) Oral 99 20 99 % - -  03/19/15 1926 - - - - - - 5\' 4"  (1.626 m) 189 lb (85.73 kg)    8:31 PM Reevaluation with update and discussion. After initial assessment and treatment, an updated evaluation reveals she is somewhat more comfortable at this time. Findings discussed with patient, all questions answered. Amber Ray '  Labs Review Labs Reviewed  I-STAT CHEM 8, ED - Abnormal; Notable for the following:    BUN 5 (*)    All other components within normal limits  I-STAT BETA HCG BLOOD, ED ([facility], [facility], [facility] ONLY)    Imaging Review No results found.   EKG Interpretation None      MDM   Final diagnoses:  Nausea vomiting and diarrhea    Evaluation consistent with nonspecific, nausea, vomiting, diarrhea. Doubt serious bacterial infection or metabolic instability.  Nursing Notes Reviewed/ Care Coordinated Applicable Imaging Reviewed Interpretation of Laboratory Data incorporated into ED treatment  The patient appears reasonably screened and/or stabilized for  discharge and I doubt any other medical condition or other [facility] requiring further screening, evaluation, or treatment in the ED at this time prior to discharge.  Plan: Home Medications- Zofran; Home Treatments- rest; return here if the recommended treatment, does not improve the symptoms; Recommended follow up- PCP prn    Daleen Bo, MD 03/19/15 2032

## 2015-03-19 NOTE — Discharge Instructions (Signed)
Start with a clear liquid diet for 1 or 2 days, then gradually advance to bland foods. See the doctor of your choice if not better in 3 or 4 days.   Nausea and Vomiting Nausea means you feel sick to your stomach. Throwing up (vomiting) is a reflex where stomach contents come out of your mouth. HOME CARE   Take medicine as told by your doctor.  Do not force yourself to eat. However, you do need to drink fluids.  If you feel like eating, eat a normal diet as told by your doctor.  Eat rice, wheat, potatoes, bread, lean meats, yogurt, fruits, and vegetables.  Avoid high-fat foods.  Drink enough fluids to keep your pee (urine) clear or pale yellow.  Ask your doctor how to replace body fluid losses (rehydrate). Signs of body fluid loss (dehydration) include:  Feeling very thirsty.  Dry lips and mouth.  Feeling dizzy.  Dark pee.  Peeing less than normal.  Feeling confused.  Fast breathing or heart rate. GET HELP RIGHT AWAY IF:   You have blood in your throw up.  You have black or bloody poop (stool).  You have a bad headache or stiff neck.  You feel confused.  You have bad belly (abdominal) pain.  You have chest pain or trouble breathing.  You do not pee at least once every 8 hours.  You have cold, clammy skin.  You keep throwing up after 24 to 48 hours.  You have a fever. MAKE SURE YOU:   Understand these instructions.  Will watch your condition.  Will get help right away if you are not doing well or get worse. Document Released: 04/13/2008 Document Revised: 01/18/2012 Document Reviewed: 03/27/2011 [facility] Patient Information 2015 [place], [place]. This information is not intended to replace advice given to you by your health care provider. Make sure you discuss any questions you have with your health care provider.  Diarrhea Diarrhea is frequent loose and watery bowel movements. It can cause you to feel weak and dehydrated. Dehydration can cause you to  become tired and thirsty, have a dry mouth, and have decreased urination that often is dark yellow. Diarrhea is a sign of another problem, most often an infection that will not last long. In most cases, diarrhea typically lasts 2-3 days. However, it can last longer if it is a sign of something more serious. It is important to treat your diarrhea as directed by your caregiver to lessen or prevent future episodes of diarrhea. CAUSES  Some common causes include:  Gastrointestinal infections caused by viruses, bacteria, or parasites.  Food poisoning or food allergies.  Certain medicines, such as antibiotics, chemotherapy, and laxatives.  Artificial sweeteners and fructose.  Digestive disorders. HOME CARE INSTRUCTIONS  Ensure adequate fluid intake (hydration): Have 1 cup (8 oz) of fluid for each diarrhea episode. Avoid fluids that contain simple sugars or sports drinks, fruit juices, whole milk products, and sodas. Your urine should be clear or pale yellow if you are drinking enough fluids. Hydrate with an oral rehydration solution that you can purchase at pharmacies, retail stores, and online. You can prepare an oral rehydration solution at home by mixing the following ingredients together:   - tsp table salt.   tsp baking soda.   tsp salt substitute containing potassium chloride.  1  tablespoons sugar.  1 L (34 oz) of water.  Certain foods and beverages may increase the speed at which food moves through the gastrointestinal (GI) tract. These foods and beverages  should be avoided and include:  Caffeinated and alcoholic beverages.  High-fiber foods, such as raw fruits and vegetables, nuts, seeds, and whole grain breads and cereals.  Foods and beverages sweetened with sugar alcohols, such as xylitol, sorbitol, and mannitol.  Some foods may be well tolerated and may help thicken stool including:  Starchy foods, such as rice, toast, pasta, low-sugar cereal, oatmeal, grits, baked  potatoes, crackers, and bagels.  Bananas.  Applesauce.  Add probiotic-rich foods to help increase healthy bacteria in the GI tract, such as yogurt and fermented milk products.  Wash your hands well after each diarrhea episode.  Only take over-the-counter or prescription medicines as directed by your caregiver.  Take a warm bath to relieve any burning or pain from frequent diarrhea episodes. SEEK IMMEDIATE MEDICAL CARE IF:   You are unable to keep fluids down.  You have persistent vomiting.  You have blood in your stool, or your stools are black and tarry.  You do not urinate in 6-8 hours, or there is only a small amount of very dark urine.  You have abdominal pain that increases or localizes.  You have weakness, dizziness, confusion, or light-headedness.  You have a severe headache.  Your diarrhea gets worse or does not get better.  You have a fever or persistent symptoms for more than 2-3 days.  You have a fever and your symptoms suddenly get worse. MAKE SURE YOU:   Understand these instructions.  Will watch your condition.  Will get help right away if you are not doing well or get worse. Document Released: 10/16/2002 Document Revised: 03/12/2014 Document Reviewed: 07/03/2012 [facility] Patient Information 2015 [place], [place]. This information is not intended to replace advice given to you by your health care provider. Make sure you discuss any questions you have with your health care provider.

## 2015-03-19 NOTE — ED Notes (Signed)
NVD, with abd pain, chills. With sweats.

## 2015-04-23 ENCOUNTER — Emergency Department (HOSPITAL_COMMUNITY)
Admission: EM | Admit: 2015-04-23 | Discharge: 2015-04-23 | Disposition: A | Discharge status: Home / Self Care | Payer: Self-pay | Attending: Emergency Medicine | Admitting: Emergency Medicine

## 2015-04-23 ENCOUNTER — Encounter (HOSPITAL_COMMUNITY): Payer: Self-pay | Admitting: Emergency Medicine

## 2015-04-23 DIAGNOSIS — Z8639 Personal history of other endocrine, nutritional and metabolic disease: Secondary | ICD-10-CM | POA: Insufficient documentation

## 2015-04-23 DIAGNOSIS — K029 Dental caries, unspecified: Secondary | ICD-10-CM | POA: Insufficient documentation

## 2015-04-23 DIAGNOSIS — K0889 Other specified disorders of teeth and supporting structures: Secondary | ICD-10-CM

## 2015-04-23 DIAGNOSIS — Z720 Tobacco use: Secondary | ICD-10-CM | POA: Insufficient documentation

## 2015-04-23 DIAGNOSIS — K088 Other specified disorders of teeth and supporting structures: Secondary | ICD-10-CM | POA: Insufficient documentation

## 2015-04-23 DIAGNOSIS — Z8619 Personal history of other infectious and parasitic diseases: Secondary | ICD-10-CM | POA: Insufficient documentation

## 2015-04-23 DIAGNOSIS — Z8659 Personal history of other mental and behavioral disorders: Secondary | ICD-10-CM | POA: Insufficient documentation

## 2015-04-23 DIAGNOSIS — Z79899 Other long term (current) drug therapy: Secondary | ICD-10-CM | POA: Insufficient documentation

## 2015-04-23 DIAGNOSIS — Z86018 Personal history of other benign neoplasm: Secondary | ICD-10-CM | POA: Insufficient documentation

## 2015-04-23 MED ORDER — AMOXICILLIN 500 MG PO CAPS: 500.0000 mg | ORAL_CAPSULE | Freq: Three times a day (TID) | ORAL | Status: DC

## 2015-04-23 MED ORDER — OXYCODONE-ACETAMINOPHEN 5-325 MG PO TABS
2.0000 | ORAL_TABLET | Freq: Once | ORAL | Status: AC
Start: 2015-04-23 — End: 2015-04-23
  Administered 2015-04-23: 2 via ORAL
  Filled 2015-04-23: qty 2

## 2015-04-23 MED ORDER — OXYCODONE-ACETAMINOPHEN 5-325 MG PO TABS: 2.0000 | ORAL_TABLET | ORAL | Status: DC | PRN

## 2015-04-23 NOTE — ED Notes (Signed)
Pt states she has chipped tooth to top and bottom right side, c/o dental pain. Worried about it being infected.

## 2015-04-23 NOTE — Discharge Instructions (Signed)
Dental Pain Take ibuprofen for pain and Percocet for breakthrough pain. A tooth ache may be caused by cavities (tooth decay). Cavities expose the nerve of the tooth to air and hot or cold temperatures. It may come from an infection or abscess (also called a boil or furuncle) around your tooth. It is also often caused by dental caries (tooth decay). This causes the pain you are having. DIAGNOSIS  Your caregiver can diagnose this problem by exam. TREATMENT   If caused by an infection, it may be treated with medications which kill germs (antibiotics) and pain medications as prescribed by your caregiver. Take medications as directed.  Only take over-the-counter or prescription medicines for pain, discomfort, or fever as directed by your caregiver.  Whether the tooth ache today is caused by infection or dental disease, you should see your dentist as soon as possible for further care. SEEK MEDICAL CARE IF: The exam and treatment you received today has been provided on an emergency basis only. This is not a substitute for complete medical or dental care. If your problem worsens or new problems (symptoms) appear, and you are unable to meet with your dentist, call or return to this location. SEEK IMMEDIATE MEDICAL CARE IF:   You have a fever.  You develop redness and swelling of your face, jaw, or neck.  You are unable to open your mouth.  You have severe pain uncontrolled by pain medicine. MAKE SURE YOU:   Understand these instructions.  Will watch your condition.  Will get help right away if you are not doing well or get worse. Document Released: 10/26/2005 Document Revised: 01/18/2012 Document Reviewed: 06/13/2008 [facility] Patient Information 2015 [place], [place]. This information is not intended to replace advice given to you by your health care provider. Make sure you discuss any questions you have with your health care provider.

## 2015-04-23 NOTE — ED Provider Notes (Signed)
CSN: [id number]     Arrival date & time 04/23/15  1039 History   First MD Initiated Contact with Patient 04/23/15 1052     Chief Complaint  Patient presents with  . Dental Pain     (Consider location/radiation/quality/duration/timing/severity/associated sxs/prior Treatment) Patient is a 24 y.o. female presenting with tooth pain. The history is provided by the patient. No language interpreter was used.  Dental Pain Associated symptoms: no drooling, no facial swelling and no fever    Amber Ray is a 24 year old female a history of anxiety, chlamydia, gonorrhea, depression, bipolar disorder, PCOS who presents for right lower dental pain that has been intermittent for the past week but worse today. No treatment prior to arrival. Nothing makes it better. She describes the pain as 10 out of 10. She denies any fever, chills, drooling, difficulty breathing, shortness of breath, throat or tongue swelling, facial swelling, and difficulty opening the jaw. Past Medical History  Diagnosis Date  . Anxiety   . Chlamydia   . Gonorrhea   . Depression   . Bipolar affective disorder, depressed, mild   . 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 Dermoid cyst   . PCOS (polycystic ovarian syndrome)    Past Surgical History  Procedure Laterality Date  . Laparoscopy  07/27/2012    Procedure: LAPAROSCOPY OPERATIVE;  Surgeon: Mora Bellman, MD;  Location: [facility];  Service: Gynecology;  Laterality: N/A;  . Ovarian cyst removal  07/27/2012    Procedure: OVARIAN CYSTECTOMY;  Surgeon: Mora Bellman, MD;  Location: [facility];  Service: Gynecology;  Laterality: Right;  Dermoid Cyst  . Hysteroscopy w/d&c N/A 01/30/2015    Procedure: DILATATION AND CURETTAGE /HYSTEROSCOPY;  Surgeon: Donnamae Jude, MD;  Location: [facility];  Service: Gynecology;  Laterality: N/A;   Family History  Problem Relation Age of Onset  . Diabetes Maternal Grandmother   . Arthritis Maternal Grandmother   . Arthritis Maternal  Grandfather   . Diabetes Paternal Grandmother   . Hypertension Paternal Grandmother   . Arthritis Paternal Grandmother   . Kidney disease Paternal Grandmother   . Arthritis Paternal Grandfather    History  Substance Use Topics  . Smoking status: Current Every Day Smoker -- 0.25 packs/day for 6 years    Types: Cigarettes  . Smokeless tobacco: Never Used  . Alcohol Use: 0.5 oz/week    1 Standard drinks or equivalent per week     Comment: occasionally   OB History    Gravida Para Term Preterm AB TAB SAB Ectopic Multiple Living   0    0  0   0     Review of Systems  Constitutional: Negative for fever.  HENT: Positive for dental problem. Negative for drooling, ear pain, facial swelling, sore throat and trouble swallowing.   Gastrointestinal: Negative for nausea and vomiting.       Allergies  Review of patient's allergies indicates no known allergies.  Home Medications   Prior to Admission medications   Medication Sig Start Date End Date Taking? Authorizing Provider  metFORMIN (GLUCOPHAGE) 1000 MG tablet Take 1 tablet (1,000 mg total) by mouth 2 (two) times daily with a meal. 07/31/14  Yes Lavonia Drafts, MD  amoxicillin (AMOXIL) 500 MG capsule Take 1 capsule (500 mg total) by mouth 3 (three) times daily. 04/23/15   Alif Petrak Patel-Mills, PA-C  ibuprofen (ADVIL,MOTRIN) 600 MG tablet Take 1 tablet (600 mg total) by mouth every 6 (six) hours as needed. Patient  not taking: Reported on 04/23/2015 01/30/15   Donnamae Jude, MD  norgestimate-ethinyl estradiol (ORTHO-CYCLEN,SPRINTEC,PREVIFEM) 0.25-35 MG-MCG tablet Take 1 tablet by mouth daily. Patient not taking: Reported on 04/23/2015 02/13/15   Donnamae Jude, MD  ondansetron (ZOFRAN) 8 MG tablet Take 1 tablet (8 mg total) by mouth every 8 (eight) hours as needed for nausea or vomiting. Patient not taking: Reported on 04/23/2015 03/19/15   Daleen Bo, MD  oxyCODONE-acetaminophen (PERCOCET/ROXICET) 5-325 MG per tablet Take 2 tablets by  mouth every 4 (four) hours as needed for severe pain. 04/23/15   Bianey Tesoro Patel-Mills, PA-C   BP 150/100 mmHg  Pulse 81  Temp(Src) 98.6 F (37 C) (Oral)  Resp 18  SpO2 99%  LMP 03/25/2015 (Approximate) Physical Exam  Constitutional: She is oriented to person, place, and time. She appears well-developed and well-nourished.  HENT:  Head: Normocephalic.  Mouth/Throat: Uvula is midline, oropharynx is clear and moist and mucous membranes are normal. No trismus in the jaw. Abnormal dentition. Dental caries present. No dental abscesses or uvula swelling. No tonsillar abscesses.    Eyes: Conjunctivae are normal.  Neck: Normal range of motion. Neck supple.  Cardiovascular: Normal rate.   Pulmonary/Chest: Effort normal.  Musculoskeletal: Normal range of motion.  Neurological: She is alert and oriented to person, place, and time.  Skin: Skin is warm and dry.    ED Course  Procedures (including critical care time) Labs Review Labs Reviewed - No data to display  Imag Review No results found.   EKG Interpretation None      MDM   Final diagnoses:  Pain, dental  Patient presents for right lower dental pain for the past week and worse today.  She denies any new tooth avulsion or fracture. There is no facial swelling, tooth abscess, trismus, drooling, difficulty breathing or signs of Ludwig's angina. Her vitals are stable and she is afebrile.  There is no submandibular swelling.  I gave her amoxicillin, percocet, and dental referral.  Patient verbally agrees with the plan.      Ottie Glazier, PA-C 04/23/15 [place], MD 04/26/15 [phone]

## 2015-04-24 ENCOUNTER — Inpatient Hospital Stay (HOSPITAL_COMMUNITY)
Admission: AD | Admit: 2015-04-24 | Discharge: 2015-04-24 | Disposition: A | Discharge status: Home / Self Care | Payer: Self-pay | Source: Ambulatory Visit | Attending: Obstetrics and Gynecology | Admitting: Obstetrics and Gynecology

## 2015-04-24 ENCOUNTER — Encounter (HOSPITAL_COMMUNITY): Payer: Self-pay | Admitting: *Deleted

## 2015-04-24 DIAGNOSIS — N939 Abnormal uterine and vaginal bleeding, unspecified: Secondary | ICD-10-CM | POA: Insufficient documentation

## 2015-04-24 DIAGNOSIS — F17210 Nicotine dependence, cigarettes, uncomplicated: Secondary | ICD-10-CM | POA: Insufficient documentation

## 2015-04-24 LAB — WET PREP, GENITAL
Clue Cells Wet Prep HPF POC: NONE SEEN
TRICH WET PREP: NONE SEEN
YEAST WET PREP: NONE SEEN

## 2015-04-24 LAB — POCT PREGNANCY, URINE: Preg Test, Ur: NEGATIVE

## 2015-04-24 NOTE — MAU Provider Note (Signed)
History     CSN: [id number]  Arrival date and time: 04/24/15 1152   First Provider Initiated Contact with Patient 04/24/15 1312      Chief Complaint  Patient presents with  . Metrorrhagia   HPI   Amber Ray is a 24 y.o. female G0P0000 presenting to MAU with irregular spotting. She had a D&C in March for PCOS and irregular bleeding. She stopped bleeding, periods were normal and regular for a few months following surgery.  In May she had a period which started out as light and then stopped. This month she is due to have her period however it has only been spotting. She has not had a regular, heavy cycle. She had sex last night and it got a little heavier, however has not changed to a regular cycle. Currently she is not bleeding.   She has no other complaints today other than wanting to know why her cycle has not come on this month. She desires pregnancy.    She has tried birth control in the past however she feels it made her bleed more She refuses to talk about and IUD for controlling her irregular bleeding. She does not want this option.   OB History    Gravida Para Term Preterm AB TAB SAB Ectopic Multiple Living   0    0  0   0      Past Medical History  Diagnosis Date  . Anxiety   . Chlamydia   . Gonorrhea   . Depression   . Bipolar affective disorder, depressed, mild   . 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 Dermoid cyst   . PCOS (polycystic ovarian syndrome)     Past Surgical History  Procedure Laterality Date  . Laparoscopy  07/27/2012    Procedure: LAPAROSCOPY OPERATIVE;  Surgeon: Mora Bellman, MD;  Location: [facility];  Service: Gynecology;  Laterality: N/A;  . Ovarian cyst removal  07/27/2012    Procedure: OVARIAN CYSTECTOMY;  Surgeon: Mora Bellman, MD;  Location: [facility];  Service: Gynecology;  Laterality: Right;  Dermoid Cyst  . Hysteroscopy w/d&c N/A 01/30/2015    Procedure: DILATATION AND CURETTAGE /HYSTEROSCOPY;  Surgeon: Donnamae Jude, MD;  Location: [facility];  Service: Gynecology;  Laterality: N/A;    Family History  Problem Relation Age of Onset  . Diabetes Maternal Grandmother   . Arthritis Maternal Grandmother   . Arthritis Maternal Grandfather   . Diabetes Paternal Grandmother   . Hypertension Paternal Grandmother   . Arthritis Paternal Grandmother   . Kidney disease Paternal Grandmother   . Arthritis Paternal Grandfather     History  Substance Use Topics  . Smoking status: Current Every Day Smoker -- 0.25 packs/day for 6 years    Types: Cigarettes  . Smokeless tobacco: Never Used  . Alcohol Use: 0.5 oz/week    1 Standard drinks or equivalent per week     Comment: occasionally    Allergies: No Known Allergies  Prescriptions prior to admission  Medication Sig Dispense Refill Last Dose  . amoxicillin (AMOXIL) 500 MG capsule Take 1 capsule (500 mg total) by mouth 3 (three) times daily. 21 capsule 0   . ibuprofen (ADVIL,MOTRIN) 600 MG tablet Take 1 tablet (600 mg total) by mouth every 6 (six) hours as needed. (Patient not taking: Reported on 04/23/2015) 30 tablet 1 Not Taking at Unknown time  . metFORMIN (GLUCOPHAGE) 1000 MG tablet Take 1 tablet (1,000 mg  total) by mouth 2 (two) times daily with a meal. 60 tablet 12 04/22/2015 at Unknown time  . norgestimate-ethinyl estradiol (ORTHO-CYCLEN,SPRINTEC,PREVIFEM) 0.25-35 MG-MCG tablet Take 1 tablet by mouth daily. (Patient not taking: Reported on 04/23/2015) 1 Package 11 Not Taking at Unknown time  . ondansetron (ZOFRAN) 8 MG tablet Take 1 tablet (8 mg total) by mouth every 8 (eight) hours as needed for nausea or vomiting. (Patient not taking: Reported on 04/23/2015) 20 tablet 0 Not Taking at Unknown time  . oxyCODONE-acetaminophen (PERCOCET/ROXICET) 5-325 MG per tablet Take 2 tablets by mouth every 4 (four) hours as needed for severe pain. 10 tablet 0    Results for orders placed or performed during the hospital encounter of 04/24/15 (from the past 48 hour(s))   Pregnancy, urine POC     Status: None   Collection Time: 04/24/15 12:08 PM  Result Value Ref Range   Preg Test, Ur NEGATIVE NEGATIVE    Comment:        THE SENSITIVITY OF THIS METHODOLOGY IS >24 mIU/mL   Wet prep, genital     Status: Abnormal   Collection Time: 04/24/15  1:50 PM  Result Value Ref Range   Yeast Wet Prep HPF POC NONE SEEN NONE SEEN   Trich, Wet Prep NONE SEEN NONE SEEN   Clue Cells Wet Prep HPF POC NONE SEEN NONE SEEN   WBC, Wet Prep HPF POC FEW (A) NONE SEEN    Comment: MODERATE BACTERIA SEEN    Review of Systems  Constitutional: Negative for fever and chills.  Gastrointestinal: Negative for abdominal pain.   Physical Exam   Blood pressure 133/73, pulse 96, temperature 98.6 F (37 C), temperature source Oral, resp. rate 18, height 5' 3.25" (1.607 m), weight 87.544 kg (193 lb), last menstrual period 03/25/2015.  Physical Exam  Constitutional: She is oriented to person, place, and time. She appears well-developed and well-nourished. No distress.  HENT:  Head: Normocephalic.  Eyes: Pupils are equal, round, and reactive to light.  Neck: Neck supple.  Genitourinary:  Speculum exam: Vagina - Small amount of thin, brown discharge, no odor Cervix - No contact bleeding GC/Chlam, wet prep done Chaperone present for exam.  Musculoskeletal: Normal range of motion.  Neurological: She is alert and oriented to person, place, and time.  Skin: Skin is warm. She is not diaphoretic.  Psychiatric: Her behavior is normal.    MAU Course  Procedures  None   MDM  Wet prep GC UA  The patient is upset about her visit today. I encouraged the patient to keep her appointment in the clinic with her GYN provider. I informed her that the emergency room is not the appropriate setting to manage PCOS and  irregular cycles. I answered her questions and the RN provided the patient with written information about PCOS.   Assessment and Plan   A:  1. Abnormal vaginal bleeding     P:  Discharge home in stable condition Follow up with [place] as scheduled Return to MAU for emergencies only.   Lezlie Lye, NP 04/24/2015 1:44 PM

## 2015-04-24 NOTE — MAU Note (Signed)
Had a D&C in March, for PCOS/heavy bleeding.  Since then, things had gone back to nromal.  Last month had a short light period; this month has not had a cycle.  Has only had some spotting off and on.

## 2015-04-24 NOTE — MAU Note (Signed)
Pt. Urine sent to lab

## 2015-04-24 NOTE — Discharge Instructions (Signed)
Abnormal Uterine Bleeding Abnormal uterine bleeding can affect women at various stages in life, including teenagers, women in their reproductive years, pregnant women, and women who have reached menopause. Several kinds of uterine bleeding are considered abnormal, including:  Bleeding or spotting between periods.   Bleeding after sexual intercourse.   Bleeding that is heavier or more than normal.   Periods that last longer than usual.  Bleeding after menopause.  Many cases of abnormal uterine bleeding are minor and simple to treat, while others are more serious. Any type of abnormal bleeding should be evaluated by your health care provider. Treatment will depend on the cause of the bleeding. HOME CARE INSTRUCTIONS Monitor your condition for any changes. The following actions may help to alleviate any discomfort you are experiencing:  Avoid the use of tampons and douches as directed by your health care provider.  Change your pads frequently. You should get regular pelvic exams and Pap tests. Keep all follow-up appointments for diagnostic tests as directed by your health care provider.  SEEK MEDICAL CARE IF:   Your bleeding lasts more than 1 week.   You feel dizzy at times.  SEEK IMMEDIATE MEDICAL CARE IF:   You pass out.   You are changing pads every 15 to 30 minutes.   You have abdominal pain.  You have a fever.   You become sweaty or weak.   You are passing large blood clots from the vagina.   You start to feel nauseous and vomit. MAKE SURE YOU:   Understand these instructions.  Will watch your condition.  Will get help right away if you are not doing well or get worse. Document Released: 10/26/2005 Document Revised: 10/31/2013 Document Reviewed: 05/25/2013 ExitCare Patient Information 2015 [employer]. This information is not intended to replace advice given to you by your health care provider. Make sure you discuss any questions you have with your  health care provider.  

## 2015-04-25 LAB — GC/CHLAMYDIA PROBE AMP (~~LOC~~) NOT AT ARMC
Chlamydia: NEGATIVE
Neisseria Gonorrhea: NEGATIVE

## 2015-06-03 ENCOUNTER — Ambulatory Visit: Payer: Self-pay | Admitting: Family Medicine

## 2016-04-05 ENCOUNTER — Encounter (HOSPITAL_COMMUNITY): Payer: Self-pay | Admitting: Emergency Medicine

## 2016-04-05 ENCOUNTER — Emergency Department (HOSPITAL_COMMUNITY)
Admission: EM | Admit: 2016-04-05 | Discharge: 2016-04-05 | Disposition: A | Discharge status: Home / Self Care | Payer: Self-pay | Attending: Emergency Medicine | Admitting: Emergency Medicine

## 2016-04-05 DIAGNOSIS — F17210 Nicotine dependence, cigarettes, uncomplicated: Secondary | ICD-10-CM | POA: Insufficient documentation

## 2016-04-05 DIAGNOSIS — S61309A Unspecified open wound of unspecified finger with damage to nail, initial encounter: Secondary | ICD-10-CM

## 2016-04-05 DIAGNOSIS — Y939 Activity, unspecified: Secondary | ICD-10-CM | POA: Insufficient documentation

## 2016-04-05 DIAGNOSIS — W548XXA Other contact with dog, initial encounter: Secondary | ICD-10-CM | POA: Insufficient documentation

## 2016-04-05 DIAGNOSIS — Z791 Long term (current) use of non-steroidal anti-inflammatories (NSAID): Secondary | ICD-10-CM | POA: Insufficient documentation

## 2016-04-05 DIAGNOSIS — F329 Major depressive disorder, single episode, unspecified: Secondary | ICD-10-CM | POA: Insufficient documentation

## 2016-04-05 DIAGNOSIS — Y929 Unspecified place or not applicable: Secondary | ICD-10-CM | POA: Insufficient documentation

## 2016-04-05 DIAGNOSIS — Y999 Unspecified external cause status: Secondary | ICD-10-CM | POA: Insufficient documentation

## 2016-04-05 DIAGNOSIS — S61112A Laceration without foreign body of left thumb with damage to nail, initial encounter: Secondary | ICD-10-CM | POA: Insufficient documentation

## 2016-04-05 MED ORDER — LIDOCAINE HCL (PF) 2 % IJ SOLN
10.0000 mL | Freq: Once | INTRAMUSCULAR | Status: AC
  Administered 2016-04-05: 10 mL
  Filled 2016-04-05: qty 10

## 2016-04-05 MED ORDER — IBUPROFEN 600 MG PO TABS: 600.0000 mg | ORAL_TABLET | Freq: Four times a day (QID) | ORAL | Status: DC | PRN

## 2016-04-05 MED ORDER — HYDROCODONE-ACETAMINOPHEN 5-325 MG PO TABS
1.0000 | ORAL_TABLET | Freq: Once | ORAL | Status: AC
  Administered 2016-04-05: 1 via ORAL
  Filled 2016-04-05: qty 1

## 2016-04-05 MED ORDER — HYDROCODONE-ACETAMINOPHEN 5-325 MG PO TABS: 1.0000 | ORAL_TABLET | ORAL | Status: DC | PRN

## 2016-04-05 MED ORDER — CEPHALEXIN 500 MG PO CAPS: 500.0000 mg | ORAL_CAPSULE | Freq: Four times a day (QID) | ORAL | Status: DC

## 2016-04-05 NOTE — ED Notes (Signed)
Pt reports last night bending fingernail back on left thumb while trying to correct dog's behavior. Pt has finger wrapped in triage.

## 2016-04-05 NOTE — Discharge Instructions (Signed)
Nail Avulsion Injury Nail avulsion means that you have lost the whole, or part of a nail. The nail will usually grow back in 2 to 6 months. If your injury damaged the growth center of the nail, the nail may be deformed, split, or not stuck to the nail bed. (I don't expect this to happen). Sometimes the avulsed nail is stitched back in place. This provides temporary protection to the nail bed until the new nail grows in.  HOME CARE INSTRUCTIONS   Raise (elevate) your injury as much as possible.  Protect the injury and cover it with bandages (dressings) or splints as instructed.  Change dressings as instructed. SEEK MEDICAL CARE IF:   There is increasing pain, redness, or swelling.  You cannot move your fingers or toes.   This information is not intended to replace advice given to you by your health care provider. Make sure you discuss any questions you have with your health care provider.   Document Released: 12/03/2004 Document Revised: 01/18/2012 Document Reviewed: 09/27/2009 Elsevier Interactive Patient Education 2016 [place] the nail plate in place as long as long as possible to help protect the nail bed while it heals.  Once the soreness is better, you no longer need to keep the plate - it will probably fall off on its own as the new nail starts growing in.  You may take the medicines prescribed for pain relief and inflammation relief.  Hydrocodone will make you drowsy - do not drive within 4 hours of taking this medication.  Wear the finger splint until your pain is improved.

## 2016-04-05 NOTE — ED Provider Notes (Signed)
CSN: [id number]     Arrival date & time 04/05/16  1409 History  By signing my name below, I, Mesha Guinyard, attest that this documentation has been prepared under the direction and in the presence of Treatment Team:  Attending Provider: Milton Ferguson, MD Physician Assistant: Evalee Jefferson, PA-C.  Electronically Signed: Verlee Monte, Medical Scribe. 04/05/2016. 3:19 PM.   Chief Complaint  Patient presents with  . Finger Injury   The history is provided by the patient. No language interpreter was used.   HPI Comments: Amber Ray is a 25 y.o. female who presents to the Emergency Department complaining of left thumb injury onset today. Pt tried to correct her dogs behavior when her dog ran away and snatched her thumb nail back causing the acrylic nail and real nail plate to tear nearly off. Pt has acrylic nails on and is right landed. Pt works at [employer], and uses her hands frequently at work.  Past Medical History  Diagnosis Date  . Anxiety   . Chlamydia   . Gonorrhea   . Depression   . Bipolar affective disorder, depressed, mild ([place])   . 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 Dermoid cyst   . PCOS (polycystic ovarian syndrome)    Past Surgical History  Procedure Laterality Date  . Laparoscopy  07/27/2012    Procedure: LAPAROSCOPY OPERATIVE;  Surgeon: Mora Bellman, MD;  Location: [facility];  Service: Gynecology;  Laterality: N/A;  . Ovarian cyst removal  07/27/2012    Procedure: OVARIAN CYSTECTOMY;  Surgeon: Mora Bellman, MD;  Location: [facility];  Service: Gynecology;  Laterality: Right;  Dermoid Cyst  . Hysteroscopy w/d&c N/A 01/30/2015    Procedure: DILATATION AND CURETTAGE /HYSTEROSCOPY;  Surgeon: Donnamae Jude, MD;  Location: [facility];  Service: Gynecology;  Laterality: N/A;   Family History  Problem Relation Age of Onset  . Diabetes Maternal Grandmother   . Arthritis Maternal Grandmother   . Arthritis Maternal Grandfather   . Diabetes Paternal  Grandmother   . Hypertension Paternal Grandmother   . Arthritis Paternal Grandmother   . Kidney disease Paternal Grandmother   . Arthritis Paternal Grandfather    Social History  Substance Use Topics  . Smoking status: Current Every Day Smoker -- 0.25 packs/day for 6 years    Types: Cigarettes  . Smokeless tobacco: Never Used  . Alcohol Use: 0.5 oz/week    1 Standard drinks or equivalent per week     Comment: occasionally   OB History    Gravida Para Term Preterm AB TAB SAB Ectopic Multiple Living   0    0  0   0     Review of Systems  Musculoskeletal:       Finger nailbed injury  All other systems reviewed and are negative.  Allergies  Review of patient's allergies indicates no known allergies.  Home Medications   Prior to Admission medications   Medication Sig Start Date End Date Taking? Authorizing Provider  HYDROcodone-acetaminophen (NORCO/VICODIN) 5-325 MG tablet Take 1 tablet by mouth every 4 (four) hours as needed. 04/05/16   Evalee Jefferson, PA-C  ibuprofen (ADVIL,MOTRIN) 600 MG tablet Take 1 tablet (600 mg total) by mouth every 6 (six) hours as needed. 04/05/16   Evalee Jefferson, PA-C   BP 111/83 mmHg  Pulse 74  Temp(Src) 98.4 F (36.9 C) (Oral)  Resp 18  Ht 5\' 4"  (1.626 m)  Wt 84.823 kg  BMI 32.08 kg/m2  SpO2 100%  LMP 03/15/2016 Physical Exam  Constitutional: She appears well-developed and well-nourished. No distress.  HENT:  Head: Normocephalic and atraumatic.  Eyes: Conjunctivae are normal.  Neck: Neck supple.  Cardiovascular: Normal rate.   Pulmonary/Chest: Effort normal.  Musculoskeletal:  Left thumb nail has a long acrylic nail attached with near avulsion of the plate - no apparent skin injury Distal sensation is nl No nail bed laceration  Neurological: She is alert.  Skin: Skin is warm and dry.  Psychiatric: She has a normal mood and affect. Her behavior is normal.  Nursing note and vitals reviewed.   ED Course  .Nail Removal Date/Time: 04/05/2016  5:04 PM Performed by: Evalee Jefferson Authorized by: Evalee Jefferson Consent: Verbal consent obtained. Risks and benefits: risks, benefits and alternatives were discussed Consent given by: patient Patient identity confirmed: verbally with patient Time out: Immediately prior to procedure a "time out" was called to verify the correct patient, procedure, equipment, support staff and site/side marked as required. Location: left hand Anesthesia: digital block Local anesthetic: lidocaine 2% without epinephrine Anesthetic total: 6 ml Procedure prep: safe clens. Amount removed: partial (Proximal nail trimmed which was pulled out from the nail fold and was unable to re -approximate. also cut acrylic nail short to help minimize movement and catching.) Wedge excision of skin of nail fold: no Nail bed sutured: no Nail matrix removed: none Removed nail replaced and anchored: yes (using tape) Dressing: splint and dressing applied Patient tolerance: Patient tolerated the procedure well with no immediate complications      DIAGNOSTIC STUDIES: Oxygen Saturation is 100% on RA, NL by my interpretation.    COORDINATION OF CARE: 5:03 PM Discussed treatment plan with pt at bedside and pt agreed to plan.  MDM   Final diagnoses:  Traumatic avulsion of nail plate of finger, initial encounter    Pt advised to keep nail in place as long as possible or until nail bed is no longer tender (approx 1 week).  She was prescribed hydrocodone, will add keflex to cover for infection.  Prn f/u anticipated.   I personally performed the services described in this documentation, which was scribed in my presence. The recorded information has been reviewed and is accurate.   Evalee Jefferson, PA-C 04/05/16 1710  Fredia Sorrow, MD 04/08/16 1700

## 2016-04-29 ENCOUNTER — Emergency Department (HOSPITAL_COMMUNITY): Payer: Self-pay

## 2016-04-29 ENCOUNTER — Emergency Department (HOSPITAL_COMMUNITY)
Admission: EM | Admit: 2016-04-29 | Discharge: 2016-04-29 | Disposition: A | Discharge status: Home / Self Care | Payer: Self-pay | Attending: Emergency Medicine | Admitting: Emergency Medicine

## 2016-04-29 ENCOUNTER — Encounter (HOSPITAL_COMMUNITY): Payer: Self-pay | Admitting: Emergency Medicine

## 2016-04-29 DIAGNOSIS — K625 Hemorrhage of anus and rectum: Secondary | ICD-10-CM | POA: Insufficient documentation

## 2016-04-29 DIAGNOSIS — F329 Major depressive disorder, single episode, unspecified: Secondary | ICD-10-CM | POA: Insufficient documentation

## 2016-04-29 DIAGNOSIS — F17210 Nicotine dependence, cigarettes, uncomplicated: Secondary | ICD-10-CM | POA: Insufficient documentation

## 2016-04-29 LAB — URINALYSIS, ROUTINE W REFLEX MICROSCOPIC
BILIRUBIN URINE: NEGATIVE
GLUCOSE, UA: NEGATIVE mg/dL
HGB URINE DIPSTICK: NEGATIVE
Ketones, ur: NEGATIVE mg/dL
Leukocytes, UA: NEGATIVE
Nitrite: NEGATIVE
PH: 5.5 (ref 5.0–8.0)
Protein, ur: NEGATIVE mg/dL

## 2016-04-29 LAB — COMPREHENSIVE METABOLIC PANEL
ALBUMIN: 3.9 g/dL (ref 3.5–5.0)
ALK PHOS: 67 U/L (ref 38–126)
ALT: 20 U/L (ref 14–54)
ANION GAP: 4 — AB (ref 5–15)
AST: 22 U/L (ref 15–41)
BUN: 7 mg/dL (ref 6–20)
CALCIUM: 8.9 mg/dL (ref 8.9–10.3)
CO2: 25 mmol/L (ref 22–32)
CREATININE: 0.58 mg/dL (ref 0.44–1.00)
Chloride: 106 mmol/L (ref 101–111)
GFR calc Af Amer: >60 mL/min (ref >60)
GFR calc non Af Amer: >60 mL/min (ref >60)
GLUCOSE: 99 mg/dL (ref 65–99)
Potassium: 3.4 mmol/L — ABNORMAL LOW (ref 3.5–5.1)
SODIUM: 135 mmol/L (ref 135–145)
TOTAL PROTEIN: 6.9 g/dL (ref 6.5–8.1)
Total Bilirubin: 0.2 mg/dL — ABNORMAL LOW (ref 0.3–1.2)

## 2016-04-29 LAB — CBC
HCT: 41.7 % (ref 36.0–46.0)
HEMOGLOBIN: 13.9 g/dL (ref 12.0–15.0)
MCH: 28.8 pg (ref 26.0–34.0)
MCHC: 33.3 g/dL (ref 30.0–36.0)
MCV: 86.3 fL (ref 78.0–100.0)
PLATELETS: 224 10*3/uL (ref 150–400)
RBC: 4.83 MIL/uL (ref 3.87–5.11)
RDW: 13.1 % (ref 11.5–15.5)
WBC: 14 10*3/uL — ABNORMAL HIGH (ref 4.0–10.5)

## 2016-04-29 LAB — POC OCCULT BLOOD, ED: Fecal Occult Bld: POSITIVE — AB

## 2016-04-29 LAB — TYPE AND SCREEN
ABO/RH(D): A POS
ANTIBODY SCREEN: NEGATIVE

## 2016-04-29 LAB — PREGNANCY, URINE: PREG TEST UR: NEGATIVE

## 2016-04-29 MED ORDER — DIATRIZOATE MEGLUMINE & SODIUM 66-10 % PO SOLN
ORAL | Status: AC
  Filled 2016-04-29: qty 30

## 2016-04-29 MED ORDER — IOPAMIDOL (ISOVUE-300) INJECTION 61%
100.0000 mL | Freq: Once | INTRAVENOUS | Status: AC | PRN
  Administered 2016-04-29: 100 mL via INTRAVENOUS

## 2016-04-29 MED ORDER — HYDROCORTISONE 2.5 % RE CREA: TOPICAL_CREAM | RECTAL | Status: DC

## 2016-04-29 NOTE — ED Notes (Signed)
Awaiting pt's urine for clearance to go to CT. Pt states she got up and went to the bathroom but forgot to get the urine sample.

## 2016-04-29 NOTE — ED Provider Notes (Signed)
CSN: [id number]     Arrival date & time 04/29/16  1910 History  By signing my name below, I, Emmanuella Mensah, attest that this documentation has been prepared under the direction and in the presence of Ezequiel Essex, MD. Electronically Signed: Judithann Sauger, ED Scribe. 04/29/2016. 8:20 PM.    Chief Complaint  Patient presents with  . GI Bleeding   The history is provided by the patient. No language interpreter was used.   HPI Comments: Amber Ray is a 25 y.o. female with a hx of PCOS who presents to the Emergency Department complaining of ongoing 4 episodes of moderate amount of blood in stool and rectal bleeding onset yesterday. She reports associated mild abdominal distension. She notes that there was blood clots within a brown stool with blood in the toilet bowl. She explains that her first episode was 4 days ago but the blood was a small amount, only when wiping. She denies any straining with BM. No alleviating factors noted. Pt has not tried any medications PTA. She reports that she has an abdominal hernia around her umbilicus onset 2 years ago and she noticed the area get firm one month ago. She denies a hx of anticoagulant use, Crohn's disease, and hemorrhoids. She reports NKDA. No recent travel outside the country or sick contacts. She denies any abdominal pain, chest pain, SOB, or n/v/d.   Past Medical History  Diagnosis Date  . Anxiety   . Chlamydia   . Gonorrhea   . Depression   . Bipolar affective disorder, depressed, mild ([place])   . 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 Dermoid cyst   . PCOS (polycystic ovarian syndrome)    Past Surgical History  Procedure Laterality Date  . Laparoscopy  07/27/2012    Procedure: LAPAROSCOPY OPERATIVE;  Surgeon: Mora Bellman, MD;  Location: [facility];  Service: Gynecology;  Laterality: N/A;  . Ovarian cyst removal  07/27/2012    Procedure: OVARIAN CYSTECTOMY;  Surgeon: Mora Bellman, MD;  Location: [facility];   Service: Gynecology;  Laterality: Right;  Dermoid Cyst  . Hysteroscopy w/d&c N/A 01/30/2015    Procedure: DILATATION AND CURETTAGE /HYSTEROSCOPY;  Surgeon: Donnamae Jude, MD;  Location: [facility];  Service: Gynecology;  Laterality: N/A;   Family History  Problem Relation Age of Onset  . Diabetes Maternal Grandmother   . Arthritis Maternal Grandmother   . Arthritis Maternal Grandfather   . Diabetes Paternal Grandmother   . Hypertension Paternal Grandmother   . Arthritis Paternal Grandmother   . Kidney disease Paternal Grandmother   . Arthritis Paternal Grandfather    Social History  Substance Use Topics  . Smoking status: Current Every Day Smoker -- 0.25 packs/day for 6 years    Types: Cigarettes  . Smokeless tobacco: Never Used  . Alcohol Use: 0.5 oz/week    1 Standard drinks or equivalent per week     Comment: occasionally   OB History    Gravida Amber Term Preterm AB TAB SAB Ectopic Multiple Living   0    0  0   0     Review of Systems  Constitutional: Negative for fever.  Respiratory: Negative for shortness of breath.   Cardiovascular: Negative for chest pain.  Gastrointestinal: Positive for blood in stool, abdominal distention and anal bleeding. Negative for nausea, vomiting and abdominal pain.  All other systems reviewed and are negative.     Allergies  Review of patient's allergies indicates no  known allergies.  Home Medications   Prior to Admission medications   Medication Sig Start Date End Date Taking? Authorizing Provider  cephALEXin (KEFLEX) 500 MG capsule Take 1 capsule (500 mg total) by mouth 4 (four) times daily. Patient not taking: Reported on 04/29/2016 04/05/16   Evalee Jefferson, PA-C  HYDROcodone-acetaminophen (NORCO/VICODIN) 5-325 MG tablet Take 1 tablet by mouth every 4 (four) hours as needed. Patient not taking: Reported on 04/29/2016 04/05/16   Evalee Jefferson, PA-C  ibuprofen (ADVIL,MOTRIN) 600 MG tablet Take 1 tablet (600 mg total) by mouth every 6 (six) hours  as needed. Patient not taking: Reported on 04/29/2016 04/05/16   Evalee Jefferson, PA-C   BP 118/75 mmHg  Pulse 93  Temp(Src) 98.9 F (37.2 C) (Oral)  Ht 5\' 5"  (1.651 m)  Wt 187 lb (84.823 kg)  BMI 31.12 kg/m2  SpO2 100%  LMP 03/15/2016 Physical Exam  Constitutional: She is oriented to person, place, and time. She appears well-developed and well-nourished. No distress.  Well-appearing  HENT:  Head: Normocephalic and atraumatic.  Mouth/Throat: Oropharynx is clear and moist. No oropharyngeal exudate.  Eyes: Conjunctivae and EOM are normal. Pupils are equal, round, and reactive to light.  Neck: Normal range of motion. Neck supple.  No meningismus.  Cardiovascular: Normal rate, regular rhythm, normal heart sounds and intact distal pulses.   No murmur heard. Pulmonary/Chest: Effort normal and breath sounds normal. No respiratory distress.  Abdominal: Soft. There is tenderness. There is no rebound and no guarding.  Periumbilical tenderness with a firm small umbilical hernia  Genitourinary:  Rectal exam: Chaperon present No hemorrhoids, fissures, no gross blood.   Musculoskeletal: Normal range of motion. She exhibits no edema or tenderness.  Neurological: She is alert and oriented to person, place, and time. No cranial nerve deficit. She exhibits normal muscle tone. Coordination normal.  No ataxia on finger to nose bilaterally. No pronator drift. 5/5 strength throughout. CN 2-12 intact.Equal grip strength. Sensation intact.   Skin: Skin is warm.  Psychiatric: She has a normal mood and affect. Her behavior is normal.  Nursing note and vitals reviewed.   ED Course  Procedures (including critical care time) DIAGNOSTIC STUDIES: Oxygen Saturation is 100% on RA, normal by my interpretation.    COORDINATION OF CARE: 8:20 PM- Pt advised of plan for treatment and pt agrees. Pt will receive CT abdomen and lab work for further evaluation.    Labs Review Labs Reviewed  COMPREHENSIVE METABOLIC  PANEL - Abnormal; Notable for the following:    Potassium 3.4 (*)    Total Bilirubin 0.2 (*)    Anion gap 4 (*)    All other components within normal limits  CBC - Abnormal; Notable for the following:    WBC 14.0 (*)    All other components within normal limits  URINALYSIS, ROUTINE W REFLEX MICROSCOPIC (NOT AT [facility]) - Abnormal; Notable for the following:    Specific Gravity, Urine <1.005 (*)    All other components within normal limits  POC OCCULT BLOOD, ED - Abnormal; Notable for the following:    Fecal Occult Bld POSITIVE (*)    All other components within normal limits  PREGNANCY, URINE  TYPE AND SCREEN    Imaging Review Ct Abdomen Pelvis W Contrast  04/29/2016  CLINICAL DATA:  4 episodes of moderate amount of blood in stool and rectal bleeding onset yesterday. She reports associated mild abdominal distension. She explains that her first episode was 4 days ago but the blood was a small amount,  only when wiping. She reports that she has an abdominal hernia around her umbilicus onset 2 years ago and she noticed the area get firm one month ago. She denies any abdominal pain, chest pain, SOB, or n/v/d. EXAM: CT ABDOMEN AND PELVIS WITH CONTRAST TECHNIQUE: Multidetector CT imaging of the abdomen and pelvis was performed using the standard protocol following bolus administration of intravenous contrast. CONTRAST:  122mL ISOVUE-300 IOPAMIDOL (ISOVUE-300) INJECTION 61% COMPARISON:  12/02/2015 FINDINGS: Lower chest:  No acute findings. Hepatobiliary: No masses or other significant abnormality. Pancreas: No mass, inflammatory changes, or other significant abnormality. Spleen: Within normal limits in size and appearance. Adrenals/Urinary Tract: No masses identified. No evidence of hydronephrosis. Stomach/Bowel: No evidence of obstruction, inflammatory process, or abnormal fluid collections. Normal appendix. Vascular/Lymphatic: No pathologically enlarged lymph nodes. No evidence of abdominal aortic  aneurysm. Reproductive: No mass or other significant abnormality. Other: No ascites.  No free air. Musculoskeletal: No suspicious bone lesions identified. Small umbilical hernia containing only mesenteric fat. IMPRESSION: 1. No acute abnormality. Electronically Signed   By: Lucrezia Europe M.D.   On: 04/29/2016 23:17     Ezequiel Essex, MD has personally reviewed and evaluated these images and lab results as part of his medical decision-making.   EKG Interpretation None      MDM   Final diagnoses:  Rectal bleeding   Patient with rectal bleeding and bloody stools intermittently for the past 3 days. Denies straining. Denies any rectal pain. Denies vomiting or fever.  No gross blood or hemorrhoids on rectal exam but Hemoccult positive. Hemoglobin stable. Vitals stable.  CT obtained. No etiology of bleeding identified. Umbilical hernia contains fat only.  Patient well appearing and hemodynamically stable. Suspect bleeding related to internal hemorrhoids or possibly diverticular.   Followup with GI.  Return to the ED with worsening bleeding, pain, dizziness or any other concerns.    I personally performed the services described in this documentation, which was scribed in my presence. The recorded information has been reviewed and is accurate.   Ezequiel Essex, MD 04/30/16 [phone]

## 2016-04-29 NOTE — Discharge Instructions (Signed)
Gastrointestinal Bleeding Follow up with the stomach doctors. Return to the ED if you develop worsening pain, bleeding or any other concerns. Gastrointestinal (GI) bleeding means there is bleeding somewhere along the digestive tract, between the mouth and anus. CAUSES  There are many different problems that can cause GI bleeding. Possible causes include:  Esophagitis. This is inflammation, irritation, or swelling of the esophagus.  Hemorrhoids.These are veins that are full of blood (engorged) in the rectum. They cause pain, inflammation, and may bleed.  Anal fissures.These are areas of painful tearing which may bleed. They are often caused by passing hard stool.  Diverticulosis.These are pouches that form on the colon over time, with age, and may bleed significantly.  Diverticulitis.This is inflammation in areas with diverticulosis. It can cause pain, fever, and bloody stools, although bleeding is rare.  Polyps and cancer. Colon cancer often starts out as precancerous polyps.  Gastritis and ulcers.Bleeding from the upper gastrointestinal tract (near the stomach) may travel through the intestines and produce black, sometimes tarry, often bad smelling stools. In certain cases, if the bleeding is fast enough, the stools may not be black, but red. This condition may be life-threatening. SYMPTOMS   Vomiting bright red blood or material that looks like coffee grounds.  Bloody, black, or tarry stools. DIAGNOSIS  Your caregiver may diagnose your condition by taking your history and performing a physical exam. More tests may be needed, including:  X-rays and other imaging tests.  Esophagogastroduodenoscopy (EGD). This test uses a flexible, lighted tube to look at your esophagus, stomach, and small intestine.  Colonoscopy. This test uses a flexible, lighted tube to look at your colon. TREATMENT  Treatment depends on the cause of your bleeding.   For bleeding from the esophagus, stomach,  small intestine, or colon, the caregiver doing your EGD or colonoscopy may be able to stop the bleeding as part of the procedure.  Inflammation or infection of the colon can be treated with medicines.  Many rectal problems can be treated with creams, suppositories, or warm baths.  Surgery is sometimes needed.  Blood transfusions are sometimes needed if you have lost a lot of blood. If bleeding is slow, you may be allowed to go home. If there is a lot of bleeding, you will need to stay in the hospital for observation. HOME CARE INSTRUCTIONS   Take any medicines exactly as prescribed.  Keep your stools soft by eating foods that are high in fiber. These foods include whole grains, legumes, fruits, and vegetables. Prunes (1 to 3 a day) work well for many people.  Drink enough fluids to keep your urine clear or pale yellow. SEEK IMMEDIATE MEDICAL CARE IF:   Your bleeding increases.  You feel lightheaded, weak, or you faint.  You have severe cramps in your back or abdomen.  You pass large blood clots in your stool.  Your problems are getting worse. MAKE SURE YOU:   Understand these instructions.  Will watch your condition.  Will get help right away if you are not doing well or get worse.   This information is not intended to replace advice given to you by your health care provider. Make sure you discuss any questions you have with your health care provider.   Document Released: 10/23/2000 Document Revised: 10/12/2012 Document Reviewed: 04/15/2015 Elsevier Interactive Patient Education [employer].

## 2016-04-29 NOTE — ED Notes (Signed)
Incorrect value on resp rate. [id number].

## 2016-04-29 NOTE — ED Notes (Signed)
Pt c/o rectal bleeding today when she has bowel movements. Pt states her movements have been soft and states there is red blood in toilet when she gets up.

## 2016-05-06 ENCOUNTER — Encounter (INDEPENDENT_AMBULATORY_CARE_PROVIDER_SITE_OTHER): Payer: Self-pay | Admitting: Internal Medicine

## 2016-05-06 ENCOUNTER — Ambulatory Visit (INDEPENDENT_AMBULATORY_CARE_PROVIDER_SITE_OTHER): Payer: Self-pay | Admitting: Internal Medicine

## 2016-08-09 ENCOUNTER — Encounter (HOSPITAL_COMMUNITY): Payer: Self-pay | Admitting: *Deleted

## 2016-08-09 ENCOUNTER — Inpatient Hospital Stay (HOSPITAL_COMMUNITY)
Admission: AD | Admit: 2016-08-09 | Discharge: 2016-08-09 | Disposition: A | Discharge status: Home / Self Care | Payer: Self-pay | Source: Ambulatory Visit | Attending: Family Medicine | Admitting: Family Medicine

## 2016-08-09 DIAGNOSIS — E282 Polycystic ovarian syndrome: Secondary | ICD-10-CM

## 2016-08-09 DIAGNOSIS — N920 Excessive and frequent menstruation with regular cycle: Secondary | ICD-10-CM | POA: Insufficient documentation

## 2016-08-09 DIAGNOSIS — N921 Excessive and frequent menstruation with irregular cycle: Secondary | ICD-10-CM

## 2016-08-09 DIAGNOSIS — F17210 Nicotine dependence, cigarettes, uncomplicated: Secondary | ICD-10-CM | POA: Insufficient documentation

## 2016-08-09 LAB — CBC
HCT: 37.8 % (ref 36.0–46.0)
Hemoglobin: 13 g/dL (ref 12.0–15.0)
MCH: 29 pg (ref 26.0–34.0)
MCHC: 34.4 g/dL (ref 30.0–36.0)
MCV: 84.4 fL (ref 78.0–100.0)
PLATELETS: 229 10*3/uL (ref 150–400)
RBC: 4.48 MIL/uL (ref 3.87–5.11)
RDW: 13.2 % (ref 11.5–15.5)
WBC: 13.2 10*3/uL — ABNORMAL HIGH (ref 4.0–10.5)

## 2016-08-09 LAB — WET PREP, GENITAL
CLUE CELLS WET PREP: NONE SEEN
SPERM: NONE SEEN
TRICH WET PREP: NONE SEEN
YEAST WET PREP: NONE SEEN

## 2016-08-09 LAB — POCT PREGNANCY, URINE: Preg Test, Ur: NEGATIVE

## 2016-08-09 NOTE — MAU Provider Note (Signed)
History     CSN: [id number]  Arrival date and time: 08/09/16 [id number]   First Provider Initiated Contact with Patient 08/09/16 0122      Chief Complaint  Patient presents with  . Vaginal Bleeding  . Abdominal Pain   HPI  Patient is a 25 year old G0 P0 who presents with 4 days of extreme cramping and heavy bleeding and clots. She reports she passed a clot approximately 1" x 5-6 inches long today which concerned her. She reports she is going through a super tampon and maxi pad of globe hours and still bleeding. Underwear occasionally. She denies any palpitations lightheadedness or dizziness. She reports she's been dealing with heavy periods her whole life. Approximately a year ago she was diagnosed with PCOS. At that time she was having heavy periods and underwent D&C after she failed a trial of Provera. Patient is currently trying to get pregnant so is no longer on birth control. Additionally she was on metformin previously which she stopped she didn't feel like it helped a whole lot. She does endorse androgenic symptoms including facial hair.  OB History    Gravida Para Term Preterm AB Living   0       0 0   SAB TAB Ectopic Multiple Live Births   0              Past Medical History:  Diagnosis Date  . Anxiety   . Bipolar affective disorder, depressed, mild ([place])   . Chlamydia   . Depression   . Dermoid cyst   . Dermoid cyst of ovary 08/2011   2.5 cm on right ovary  . Gonorrhea   . PCOS (polycystic ovarian syndrome)     Past Surgical History:  Procedure Laterality Date  . HYSTEROSCOPY W/D&C N/A 01/30/2015   Procedure: DILATATION AND CURETTAGE /HYSTEROSCOPY;  Surgeon: Donnamae Jude, MD;  Location: [facility];  Service: Gynecology;  Laterality: N/A;  . LAPAROSCOPY  07/27/2012   Procedure: LAPAROSCOPY OPERATIVE;  Surgeon: Mora Bellman, MD;  Location: [facility];  Service: Gynecology;  Laterality: N/A;  . OVARIAN CYST REMOVAL  07/27/2012   Procedure: OVARIAN CYSTECTOMY;  Surgeon: Mora Bellman, MD;  Location: [facility];  Service: Gynecology;  Laterality: Right;  Dermoid Cyst    Family History  Problem Relation Age of Onset  . Diabetes Maternal Grandmother   . Arthritis Maternal Grandmother   . Arthritis Maternal Grandfather   . Diabetes Paternal Grandmother   . Hypertension Paternal Grandmother   . Arthritis Paternal Grandmother   . Kidney disease Paternal Grandmother   . Arthritis Paternal Grandfather     Social History  Substance Use Topics  . Smoking status: Current Every Day Smoker    Packs/day: 0.25    Years: 6.00    Types: Cigarettes  . Smokeless tobacco: Never Used  . Alcohol use 0.5 oz/week    1 Standard drinks or equivalent per week     Comment: occasionally    Allergies: No Known Allergies  Prescriptions Prior to Admission  Medication Sig Dispense Refill Last Dose  . cephALEXin (KEFLEX) 500 MG capsule Take 1 capsule (500 mg total) by mouth 4 (four) times daily. (Patient not taking: Reported on 04/29/2016) 28 capsule 0   . HYDROcodone-acetaminophen (NORCO/VICODIN) 5-325 MG tablet Take 1 tablet by mouth every 4 (four) hours as needed. (Patient not taking: Reported on 04/29/2016) 15 tablet 0   . hydrocortisone (ANUSOL-HC) 2.5 % rectal cream Apply rectally 2 times daily 28.35 g 0   .  ibuprofen (ADVIL,MOTRIN) 600 MG tablet Take 1 tablet (600 mg total) by mouth every 6 (six) hours as needed. (Patient not taking: Reported on 04/29/2016) 30 tablet 0     Review of Systems  Constitutional: Negative for chills and fever.  HENT: Negative for congestion.   Eyes: Negative for blurred vision and double vision.  Respiratory: Negative for cough and sputum production.   Cardiovascular: Negative for chest pain and palpitations.  Gastrointestinal: Positive for abdominal pain. Negative for constipation, diarrhea, heartburn, nausea and vomiting.  Genitourinary: Negative for dysuria, frequency and urgency.  Musculoskeletal: Negative for back pain and myalgias.  Skin:  Negative for itching and rash.  Neurological: Negative for dizziness, tingling and headaches.  Endo/Heme/Allergies: Negative for environmental allergies. Does not bruise/bleed easily.   Physical Exam   Blood pressure (!) 108/44, pulse 87, temperature 97.5 F (36.4 C), resp. rate 18, height 5\' 4"  (1.626 m), weight 182 lb 3.2 oz (82.6 kg), last menstrual period 08/05/2016.  Physical Exam  Constitutional: She appears well-developed and well-nourished.  HENT:  Head: Normocephalic and atraumatic.  Cardiovascular: Normal rate, regular rhythm and intact distal pulses.   Respiratory: Effort normal and breath sounds normal. No respiratory distress. She has no wheezes.  GI: Soft. Bowel sounds are normal. She exhibits no distension. There is no tenderness. There is no rebound and no guarding.  Genitourinary:    Genitourinary Comments: Vaginal vault with mild amount of dark blood, mild irritation on external cervix.    MAU Course  Procedures  MDM In MAU patient was hemodynamically stable, underwent pelvic exam which revealed small amount of blood in the vaginal vault. Patient's CBC was checked and had a hemoglobin of 13.1. Lengthy discussion was had with patient regarding options for heavy bleeding in the setting of PCOS. Discussed with the patient that birth control would likely be the best option to control her pain possibly Depo-Provera or a IUD that she failed OCPs previously. She additionally failed a Provera burst last year so does not really wish to try again. Patient did see Dr. Kennon Rounds in the women's clinic for evaluation and management and she does wish to follow up with her as she does not have insurance.   We did check a wet prep and gonorrhea chlamydia today to ensure infection was not contributing to the heaviness of her bleeding. Wet prep is reviewed and negative.  Off note from her last visit was reviewed and the fibrocystic bumps on the cervix and vagina were checked previously and  found to be benign.  Assessment and Plan  #1. Menorrhagia. Would recommend birth control however patient is trying to get pregnant. Given normal hemoglobin recommended supportive care. Patient does not wish to try Provera burst this time. She will call get a follow-up appointment in the clinic.   Amber Ray 08/09/2016, 1:22 AM

## 2016-08-09 NOTE — Discharge Instructions (Signed)

## 2016-08-09 NOTE — MAU Note (Addendum)
Vag bleeding for 4 days. Usually have a heavy flow but this period very heavy and having big clots the size of my hand. Used about 9 pads today along with using tampons. Have had severe abd pain for 2 wks even before period started. Pain is now lower left abd and goes around to my back

## 2016-08-10 LAB — GC/CHLAMYDIA PROBE AMP (~~LOC~~) NOT AT ARMC
CHLAMYDIA, DNA PROBE: NEGATIVE
Neisseria Gonorrhea: NEGATIVE

## 2016-08-13 ENCOUNTER — Inpatient Hospital Stay (HOSPITAL_COMMUNITY)
Admission: AD | Admit: 2016-08-13 | Discharge: 2016-08-13 | Disposition: A | Discharge status: Home / Self Care | Payer: Self-pay | Source: Ambulatory Visit | Attending: Family Medicine | Admitting: Family Medicine

## 2016-08-13 DIAGNOSIS — N939 Abnormal uterine and vaginal bleeding, unspecified: Secondary | ICD-10-CM | POA: Insufficient documentation

## 2016-08-13 DIAGNOSIS — F17210 Nicotine dependence, cigarettes, uncomplicated: Secondary | ICD-10-CM | POA: Insufficient documentation

## 2016-08-13 LAB — CBC
HEMATOCRIT: 37.6 % (ref 36.0–46.0)
Hemoglobin: 13 g/dL (ref 12.0–15.0)
MCH: 29.2 pg (ref 26.0–34.0)
MCHC: 34.6 g/dL (ref 30.0–36.0)
MCV: 84.5 fL (ref 78.0–100.0)
PLATELETS: 249 10*3/uL (ref 150–400)
RBC: 4.45 MIL/uL (ref 3.87–5.11)
RDW: 13.2 % (ref 11.5–15.5)
WBC: 15.3 10*3/uL — AB (ref 4.0–10.5)

## 2016-08-13 MED ORDER — MEDROXYPROGESTERONE ACETATE 10 MG PO TABS: 10.0000 mg | ORAL_TABLET | Freq: Every day | ORAL | 0 refills | Status: DC

## 2016-08-13 NOTE — Discharge Instructions (Signed)

## 2016-08-13 NOTE — MAU Note (Signed)
Pt states that she is having some vag bleeding. States that nothing has changed with bleeding. Was here over the weekend and called to schedule appointment with Dr Kennon Rounds, but after hours nurse told her to come in to get Hgb checked.

## 2016-08-13 NOTE — MAU Provider Note (Signed)
History     CSN: [id number]  Arrival date and time: 08/13/16 2133   First Provider Initiated Contact with Patient 08/13/16 2312      Chief Complaint  Patient presents with  . Vaginal Bleeding   Vaginal Bleeding  The patient's primary symptoms include pelvic pain and vaginal discharge. This is a new problem. Episode onset: around 08/05/16  The problem occurs constantly. The problem has been gradually improving. She is not pregnant. Associated symptoms include abdominal pain. Pertinent negatives include no chills, constipation, diarrhea, dysuria, fever, frequency, nausea, urgency or vomiting. The vaginal discharge was bloody. The vaginal bleeding is heavier than menses. She has been passing clots. She has not been passing tissue. Nothing aggravates the symptoms. She has tried nothing for the symptoms. Menstrual history: LMP 08/05/16    Past Medical History:  Diagnosis Date  . Anxiety   . Bipolar affective disorder, depressed, mild ([place])   . Chlamydia   . Depression   . Dermoid cyst   . Dermoid cyst of ovary 08/2011   2.5 cm on right ovary  . Gonorrhea   . PCOS (polycystic ovarian syndrome)     Past Surgical History:  Procedure Laterality Date  . HYSTEROSCOPY W/D&C N/A 01/30/2015   Procedure: DILATATION AND CURETTAGE /HYSTEROSCOPY;  Surgeon: Donnamae Jude, MD;  Location: [facility];  Service: Gynecology;  Laterality: N/A;  . LAPAROSCOPY  07/27/2012   Procedure: LAPAROSCOPY OPERATIVE;  Surgeon: Mora Bellman, MD;  Location: [facility];  Service: Gynecology;  Laterality: N/A;  . OVARIAN CYST REMOVAL  07/27/2012   Procedure: OVARIAN CYSTECTOMY;  Surgeon: Mora Bellman, MD;  Location: [facility];  Service: Gynecology;  Laterality: Right;  Dermoid Cyst    Family History  Problem Relation Age of Onset  . Diabetes Maternal Grandmother   . Arthritis Maternal Grandmother   . Arthritis Maternal Grandfather   . Diabetes Paternal Grandmother   . Hypertension Paternal Grandmother   . Arthritis  Paternal Grandmother   . Kidney disease Paternal Grandmother   . Arthritis Paternal Grandfather     Social History  Substance Use Topics  . Smoking status: Current Every Day Smoker    Packs/day: 0.25    Years: 6.00    Types: Cigarettes  . Smokeless tobacco: Never Used  . Alcohol use 0.5 oz/week    1 Standard drinks or equivalent per week     Comment: occasionally    Allergies: No Known Allergies  Prescriptions Prior to Admission  Medication Sig Dispense Refill Last Dose  . hydrocortisone (ANUSOL-HC) 2.5 % rectal cream Apply rectally 2 times daily 28.35 g 0     Review of Systems  Constitutional: Negative for chills and fever.  Gastrointestinal: Positive for abdominal pain. Negative for constipation, diarrhea, nausea and vomiting.  Genitourinary: Positive for pelvic pain, vaginal bleeding and vaginal discharge. Negative for dysuria, frequency and urgency.   Physical Exam   Blood pressure 114/71, pulse 80, temperature 98.1 F (36.7 C), temperature source Oral, resp. rate 20, height 5\' 4"  (1.626 m), weight 179 lb (81.2 kg), last menstrual period 08/05/2016, SpO2 100 %.  Physical Exam  Nursing note and vitals reviewed. Constitutional: She is oriented to person, place, and time. She appears well-developed and well-nourished. No distress.  Cardiovascular: Normal rate.   GI: Soft. There is no tenderness. There is no rebound.  Neurological: She is alert and oriented to person, place, and time.  Skin: Skin is warm and dry.  Psychiatric: She has a normal mood and affect.  Results for orders placed or performed during the hospital encounter of 08/13/16 (from the past 24 hour(s))  CBC     Status: Abnormal   Collection Time: 08/13/16 10:42 PM  Result Value Ref Range   WBC 15.3 (H) 4.0 - 10.5 K/uL   RBC 4.45 3.87 - 5.11 MIL/uL   Hemoglobin 13.0 12.0 - 15.0 g/dL   HCT 37.6 36.0 - 46.0 %   MCV 84.5 78.0 - 100.0 fL   MCH 29.2 26.0 - 34.0 pg   MCHC 34.6 30.0 - 36.0 g/dL   RDW  13.2 11.5 - 15.5 %   Platelets 249 150 - 400 K/uL    MAU Course  Procedures  MDM   Assessment and Plan   1. Abnormal uterine bleeding (AUB)    DC home Comfort measures reviewed  Bleeding precautions RX: provera 10mg  QD #10  Return to MAU as needed FU with OB as planned  [place] [facility] .   Specialty:  Obstetrics and Gynecology Contact information: [address] [place] [address] [phone]           Mathis Bud 08/13/2016, 11:14 PM

## 2016-08-20 ENCOUNTER — Ambulatory Visit (INDEPENDENT_AMBULATORY_CARE_PROVIDER_SITE_OTHER): Payer: Self-pay | Admitting: Family Medicine

## 2016-08-20 ENCOUNTER — Encounter: Payer: Self-pay | Admitting: Family Medicine

## 2016-08-20 VITALS — BP 107/65 | HR 73 | Ht 64.0 in | Wt 181.0 lb

## 2016-08-20 DIAGNOSIS — N921 Excessive and frequent menstruation with irregular cycle: Secondary | ICD-10-CM

## 2016-08-20 LAB — TSH: TSH: 1.19 mIU/L

## 2016-08-20 NOTE — Assessment & Plan Note (Signed)
Given that cycles were previously normal after D and C, will check TSH and pelvic sono. Expectant management until then. May use provera if needed for abnormal bleeding.  Lengthy discussion with patient. If desires treatment for infertility, would have to pay for services. May do home ovulation prediction kits. Discussed HSG and semen analysis as well.

## 2016-08-20 NOTE — Patient Instructions (Signed)

## 2016-08-20 NOTE — Progress Notes (Signed)
   Subjective:    Patient ID: Amber Ray is a 25 y.o. female presenting with Follow-up (from [facility])  on 08/20/2016  HPI: Patient is here for bleeding. She has previously had a problem and needed a D and C. Following this, she was on OC's for some time. She stopped this as she was trying to get pregnant. She has h/o PCOS diagnosed last 1-2 years when she skipped cycle. Flow had previously been normal and until last cycle. Has not been skipping cycles. Had regular cycle in August, then had a long cycle in September and it lasted 2 wks. Stopped with Provera, given in the ED.  Review of Systems  Constitutional: Negative for chills and fever.  Respiratory: Negative for shortness of breath.   Cardiovascular: Negative for chest pain.  Gastrointestinal: Negative for abdominal pain, nausea and vomiting.  Genitourinary: Negative for dysuria.  Skin: Negative for rash.      Objective:    BP 107/65   Pulse 73   Ht 5\' 4"  (1.626 m)   Wt 181 lb (82.1 kg)   LMP 08/05/2016   BMI 31.07 kg/m  Physical Exam  Constitutional: She is oriented to person, place, and time. She appears well-developed and well-nourished. No distress.  HENT:  Head: Normocephalic and atraumatic.  Eyes: No scleral icterus.  Neck: Neck supple.  Cardiovascular: Normal rate.   Pulmonary/Chest: Effort normal.  Abdominal: Soft.  Neurological: She is alert and oriented to person, place, and time.  Skin: Skin is warm and dry.  Psychiatric: She has a normal mood and affect.        Assessment & Plan:   Problem List Items Addressed This Visit      Unprioritized   Menorrhagia with irregular cycle - Primary    Given that cycles were previously normal after D and C, will check TSH and pelvic sono. Expectant management until then. May use provera if needed for abnormal bleeding.  Lengthy discussion with patient. If desires treatment for infertility, would have to pay for services. May do home ovulation prediction kits.  Discussed HSG and semen analysis as well.       Relevant Orders   TSH   US Pelvis Complete   US Transvaginal Non-OB    Other Visit Diagnoses   None.     Total face-to-face time with patient: 15 minutes. Over 50% of encounter was spent on counseling and coordination of care. Return in about 4 weeks (around 09/17/2016) for a follow-up.  Donnamae Jude 08/20/2016 9:33 AM

## 2016-08-25 ENCOUNTER — Ambulatory Visit (HOSPITAL_COMMUNITY): Payer: Self-pay

## 2016-08-27 ENCOUNTER — Ambulatory Visit (HOSPITAL_COMMUNITY)
Admission: RE | Admit: 2016-08-27 | Discharge: 2016-08-27 | Disposition: A | Discharge status: Home / Self Care | Payer: Self-pay | Source: Ambulatory Visit | Attending: Family Medicine | Admitting: Family Medicine

## 2016-08-27 DIAGNOSIS — N921 Excessive and frequent menstruation with irregular cycle: Secondary | ICD-10-CM

## 2016-08-27 DIAGNOSIS — N920 Excessive and frequent menstruation with regular cycle: Secondary | ICD-10-CM | POA: Insufficient documentation

## 2016-09-02 ENCOUNTER — Telehealth: Payer: Self-pay | Admitting: *Deleted

## 2016-09-02 MED ORDER — MEDROXYPROGESTERONE ACETATE 10 MG PO TABS: 10.0000 mg | ORAL_TABLET | Freq: Every day | ORAL | 0 refills | Status: DC

## 2016-09-02 NOTE — Telephone Encounter (Addendum)
Pt left message last evening stating that she has been bleeding since 2 days after her last office visit w/Dr. Kennon Rounds (10/12).  Her next scheduled visit is on 11/9. She is requesting refill of Provera to stop the bleeding. I called pt today after consult with Dr. Nehemiah Settle. I advised that I have sent a refill for 10 days of Provera. When she sees Dr. Kennon Rounds on 11/9, she should discuss all of the bleeding episodes she has had since the last visit on 10/12.  Pt voiced understanding.

## 2016-09-17 ENCOUNTER — Encounter: Payer: Self-pay | Admitting: Family Medicine

## 2016-09-17 ENCOUNTER — Ambulatory Visit (INDEPENDENT_AMBULATORY_CARE_PROVIDER_SITE_OTHER): Payer: Self-pay | Admitting: Family Medicine

## 2016-09-17 VITALS — BP 117/72 | HR 96 | Ht 64.0 in | Wt 182.0 lb

## 2016-09-17 DIAGNOSIS — N921 Excessive and frequent menstruation with irregular cycle: Secondary | ICD-10-CM

## 2016-09-17 MED ORDER — DOXYCYCLINE HYCLATE 100 MG PO CAPS: 100.0000 mg | ORAL_CAPSULE | Freq: Two times a day (BID) | ORAL | 0 refills | Status: DC

## 2016-09-17 MED ORDER — NORGESTIMATE-ETH ESTRADIOL 0.25-35 MG-MCG PO TABS: 1.0000 | ORAL_TABLET | Freq: Every day | ORAL | 11 refills | Status: DC

## 2016-09-17 NOTE — Patient Instructions (Signed)

## 2016-09-17 NOTE — Progress Notes (Signed)
Subjective:    Patient ID: Amber Ray is a 25 y.o. female presenting with  Chief Complaint  Patient presents with  . Follow-up    on 09/17/2016  HPI: Here today for f/u. Has been previously seen for bleeding. Has had TSH and Pelvic sono which were normal. Was having bleeding, began provera,which stopped her bleeding. Once she was finished she began bleeding. She desires to stop bleeding.  Review of Systems  Constitutional: Negative for chills and fever.  Respiratory: Negative for shortness of breath.   Cardiovascular: Negative for chest pain.  Gastrointestinal: Negative for abdominal pain, nausea and vomiting.  Genitourinary: Negative for dysuria.  Skin: Negative for rash.      Objective:    There were no vitals taken for this visit. Physical Exam  Constitutional: She is oriented to person, place, and time. She appears well-developed and well-nourished. No distress.  HENT:  Head: Normocephalic and atraumatic.  Eyes: No scleral icterus.  Neck: Neck supple.  Cardiovascular: Normal rate.   Pulmonary/Chest: Effort normal.  Abdominal: Soft.  Neurological: She is alert and oriented to person, place, and time.  Skin: Skin is warm and dry.  Psychiatric: She has a normal mood and affect.       US Transvaginal Non-ob  Result Date: 08/27/2016 CLINICAL DATA:  Menorrhagia with irregular cycle. The patient had bleeding for 3 weeks last cycle. History of laparoscopic ovarian cystectomy in 2013, hysteroscopy and D and C in 2016. History of PCOS, dermoid cysts, and vaginal lesion. Gravida 0 para 0. LMP 08/05/2016. EXAM: TRANSABDOMINAL AND TRANSVAGINAL ULTRASOUND OF PELVIS TECHNIQUE: Both transabdominal and transvaginal ultrasound examinations of the pelvis were performed. Transabdominal technique was performed for global imaging of the pelvis including uterus, ovaries, adnexal regions, and pelvic cul-de-sac. It was necessary to proceed with endovaginal exam following the  transabdominal exam to visualize the endometrium and ovaries. COMPARISON:  CT of the abdomen and pelvis on 04/29/2016 FINDINGS: Uterus Measurements: 7.1 x 3.1 x 4.3 cm. No fibroids or other mass visualized. Endometrium Thickness: 8.1 mm.  No focal abnormality visualized. Right ovary Measurements: 4.6 x 2.4 x 4.0 cm. Follicles are present. Left ovary Measurements: 2.7 x 5.0 x 3.3 cm. Follicles are identified. Other findings Trace free pelvic fluid. IMPRESSION: 1. Normal appearance of the uterus and endometrium. If bleeding remains unresponsive to hormonal or medical therapy, sonohysterogram should be considered for focal lesion work-up. (Ref: Radiological Reasoning: Algorithmic Workup of Abnormal Vaginal Bleeding with Endovaginal Sonography and Sonohysterography. AJR 2008; [id number]) 2. Normal appearance of both ovaries.  No adnexal mass. Electronically Signed   By: Nolon Nations M.D.   On: 08/27/2016 13:43   US Pelvis Complete  Result Date: 08/27/2016 CLINICAL DATA:  Menorrhagia with irregular cycle. The patient had bleeding for 3 weeks last cycle. History of laparoscopic ovarian cystectomy in 2013, hysteroscopy and D and C in 2016. History of PCOS, dermoid cysts, and vaginal lesion. Gravida 0 para 0. LMP 08/05/2016. EXAM: TRANSABDOMINAL AND TRANSVAGINAL ULTRASOUND OF PELVIS TECHNIQUE: Both transabdominal and transvaginal ultrasound examinations of the pelvis were performed. Transabdominal technique was performed for global imaging of the pelvis including uterus, ovaries, adnexal regions, and pelvic cul-de-sac. It was necessary to proceed with endovaginal exam following the transabdominal exam to visualize the endometrium and ovaries. COMPARISON:  CT of the abdomen and pelvis on 04/29/2016 FINDINGS: Uterus Measurements: 7.1 x 3.1 x 4.3 cm. No fibroids or other mass visualized. Endometrium Thickness: 8.1 mm.  No focal abnormality visualized. Right ovary Measurements: 4.6 x  2.4 x 4.0 cm. Follicles are  present. Left ovary Measurements: 2.7 x 5.0 x 3.3 cm. Follicles are identified. Other findings Trace free pelvic fluid. IMPRESSION: 1. Normal appearance of the uterus and endometrium. If bleeding remains unresponsive to hormonal or medical therapy, sonohysterogram should be considered for focal lesion work-up. (Ref: Radiological Reasoning: Algorithmic Workup of Abnormal Vaginal Bleeding with Endovaginal Sonography and Sonohysterography. AJR 2008; [id number]) 2. Normal appearance of both ovaries.  No adnexal mass. Electronically Signed   By: Nolon Nations M.D.   On: 08/27/2016 13:43   CBC    Component Value Date/Time   WBC 15.3 (H) 08/13/2016 2242   RBC 4.45 08/13/2016 2242   HGB 13.0 08/13/2016 2242   HCT 37.6 08/13/2016 2242   PLT 249 08/13/2016 2242   MCV 84.5 08/13/2016 2242   MCH 29.2 08/13/2016 2242   MCHC 34.6 08/13/2016 2242   RDW 13.2 08/13/2016 2242   LYMPHSABS 2.3 11/14/2014 1920   MONOABS 1.0 11/14/2014 1920   EOSABS 0.2 11/14/2014 1920   BASOSABS 0.0 11/14/2014 1920    TSH mIU/L 1.19     Assessment & Plan:   Problem List Items Addressed This Visit      Unprioritized   Menorrhagia with irregular cycle - Primary    Trial of OC's, x 2 months, if does not stop bleeding with one pill/day-->increase to 2pills/day      Relevant Medications   norgestimate-ethinyl estradiol (ORTHO-CYCLEN,SPRINTEC,PREVIFEM) 0.25-35 MG-MCG tablet   doxycycline (VIBRAMYCIN) 100 MG capsule      Total face-to-face time with patient: 15 minutes. Over 50% of encounter was spent on counseling and coordination of care. Return in about 2 months (around 11/17/2016) for a follow-up.  Donnamae Jude 09/17/2016 10:49 AM

## 2016-09-17 NOTE — Assessment & Plan Note (Signed)
Trial of OC's, x 2 months, if does not stop bleeding with one pill/day-->increase to 2pills/day

## 2017-03-29 ENCOUNTER — Encounter (HOSPITAL_COMMUNITY): Payer: Self-pay

## 2017-03-29 ENCOUNTER — Emergency Department (HOSPITAL_COMMUNITY)
Admission: EM | Admit: 2017-03-29 | Discharge: 2017-03-29 | Disposition: A | Discharge status: Home / Self Care | Payer: Self-pay | Attending: Emergency Medicine | Admitting: Emergency Medicine

## 2017-03-29 DIAGNOSIS — Z79899 Other long term (current) drug therapy: Secondary | ICD-10-CM | POA: Insufficient documentation

## 2017-03-29 DIAGNOSIS — F17210 Nicotine dependence, cigarettes, uncomplicated: Secondary | ICD-10-CM | POA: Insufficient documentation

## 2017-03-29 DIAGNOSIS — M5442 Lumbago with sciatica, left side: Secondary | ICD-10-CM | POA: Insufficient documentation

## 2017-03-29 LAB — URINALYSIS, ROUTINE W REFLEX MICROSCOPIC
BILIRUBIN URINE: NEGATIVE
Glucose, UA: NEGATIVE mg/dL
HGB URINE DIPSTICK: NEGATIVE
KETONES UR: NEGATIVE mg/dL
Leukocytes, UA: NEGATIVE
Nitrite: NEGATIVE
PROTEIN: NEGATIVE mg/dL
SPECIFIC GRAVITY, URINE: 1.021 (ref 1.005–1.030)
pH: 5 (ref 5.0–8.0)

## 2017-03-29 NOTE — Discharge Instructions (Signed)
The pain in your left low back, radiating to the left leg, is likely caused by a condition called sciatica.  There are many potential causes for sciatica but in your case it is likely related to muscle irritation, which is aggravated a nerve in the lumbar spine region.  The best treatments are rest, avoiding lifting, anti-inflammatory medicine such as ibuprofen 400 mg 3 times a day, ice treatments 3 or 4 times a day for several days, and follow that with heat applications several times a day.  Once you start using heat it is good idea to stretch the muscles of the lower back and legs, and continue to rest until your pain improves.  If you are not better in 1 week, you should follow-up with a primary care doctor for further treatment.

## 2017-03-29 NOTE — ED Notes (Signed)
Pt left without rcving discharge instructions or signing signature pad

## 2017-03-29 NOTE — ED Provider Notes (Signed)
[facility] DEPT Provider Note   CSN: [id number] Arrival date & time: 03/29/17  1791     History   Chief Complaint Chief Complaint  Patient presents with  . Back Pain    HPI Amber Ray is a 26 y.o. female.  She presents for evaluation of left low back pain radiating to the left buttocks left upper leg and left foot.  It is been present 2 days and is without trauma.  She thinks she might have a urinary tract infection, because of this pain, so she started taking Azo, which is not helping.  She has also tried taking acetaminophen, without relief.  No known trauma.  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  She denies fever, chills, nausea, vomiting, dysuria, hematuria, urinary frequency, nausea, vomiting, diarrhea, weakness or paresthesias.  No prior similar problems.  There are no other known modifying factors.  HPI  Past Medical History:  Diagnosis Date  . Anxiety   . Bipolar affective disorder, depressed, mild ([place])   . Chlamydia   . Depression   . Dermoid cyst   . Dermoid cyst of ovary 08/2011   2.5 cm on right ovary  . Gonorrhea   . PCOS (polycystic ovarian syndrome)     Patient Active Problem List   Diagnosis Date Noted  . Vaginal lesion 01/30/2015  . Menorrhagia with irregular cycle 12/19/2014  . PCOS (polycystic ovarian syndrome) 08/21/2013  . Dermoid cyst 07/06/2012    Past Surgical History:  Procedure Laterality Date  . HYSTEROSCOPY W/D&C N/A 01/30/2015   Procedure: DILATATION AND CURETTAGE /HYSTEROSCOPY;  Surgeon: Donnamae Jude, MD;  Location: [facility];  Service: Gynecology;  Laterality: N/A;  . LAPAROSCOPY  07/27/2012   Procedure: LAPAROSCOPY OPERATIVE;  Surgeon: Mora Bellman, MD;  Location: [facility];  Service: Gynecology;  Laterality: N/A;  . OVARIAN CYST REMOVAL  07/27/2012   Procedure: OVARIAN CYSTECTOMY;  Surgeon: Mora Bellman, MD;  Location: [facility];  Service: Gynecology;  Laterality: Right;  Dermoid Cyst     OB History    Gravida Para Term Preterm AB Living   0       0 0   SAB TAB Ectopic Multiple Live Births   0               Home Medications    Prior to Admission medications   Medication Sig Start Date End Date Taking? Authorizing Provider  phenazopyridine (AZO-STANDARD) 95 MG tablet Take 95 mg by mouth 3 (three) times daily as needed for pain.   Yes [provider]    Family History Family History  Problem Relation Age of Onset  . Diabetes Maternal Grandmother   . Arthritis Maternal Grandmother   . Arthritis Maternal Grandfather   . Diabetes Paternal Grandmother   . Hypertension Paternal Grandmother   . Arthritis Paternal Grandmother   . Kidney disease Paternal Grandmother   . Arthritis Paternal Grandfather     Social History Social History  Substance Use Topics  . Smoking status: Current Every Day Smoker    Packs/day: 0.50    Years: 6.00    Types: Cigarettes  . Smokeless tobacco: Never Used  . Alcohol use 0.5 oz/week    1 Standard drinks or equivalent per week     Comment: occasionally     Allergies   Patient has no known allergies.   Review of Systems Review of Systems  All other systems reviewed and are negative.  Physical Exam Updated Vital Signs BP 119/75 (BP Location: Left Arm)   Pulse (!) 103   Temp 98.3 F (36.8 C) (Oral)   Resp 18   Ht 5\' 5"  (1.651 m)   Wt 83.9 kg (185 lb)   LMP 03/02/2017   SpO2 97%   BMI 30.79 kg/m   Physical Exam  Constitutional: She is oriented to person, place, and time. She appears well-developed and well-nourished.  HENT:  Head: Normocephalic and atraumatic.  Eyes: Conjunctivae and EOM are normal. Pupils are equal, round, and reactive to light.  Neck: Normal range of motion and phonation normal. Neck supple.  Cardiovascular: Normal rate and regular rhythm.   Pulmonary/Chest: Effort normal and breath sounds normal. She exhibits no tenderness.  Abdominal: Soft. She exhibits no distension. There  is no tenderness. There is no guarding.  Musculoskeletal: Normal range of motion.  Neurological: She is alert and oriented to person, place, and time. She exhibits normal muscle tone.  Skin: Skin is warm and dry.  Psychiatric: She has a normal mood and affect. Her behavior is normal. Judgment and thought content normal.  Nursing note and vitals reviewed.    ED Treatments / Results  Labs (all labs ordered are listed, but only abnormal results are displayed) Labs Reviewed  URINALYSIS, ROUTINE W REFLEX MICROSCOPIC    EKG  EKG Interpretation None       Radiology No results found.  Procedures Procedures (including critical care time)  Medications Ordered in ED Medications - No data to display   Initial Impression / Assessment and Plan / ED Course  I have reviewed the triage vital signs and the nursing notes.  Pertinent labs & imaging results that were available during my care of the patient were reviewed by me and considered in my medical decision making (see chart for details).      Patient Vitals for the past 24 hrs:  BP Temp Temp src Pulse Resp SpO2 Height Weight  03/29/17 1920 119/75 98.3 F (36.8 C) Oral (!) 103 18 97 % 5\' 5"  (1.651 m) 83.9 kg (185 lb)    9:25 PM Reevaluation with update and discussion. After initial assessment and treatment, an updated evaluation reveals no change in clinical status.  Findings discussed with the patient and all questions were answered. Hannie Shoe L    Final Clinical Impressions(s) / ED Diagnoses   Final diagnoses:  Acute left-sided low back pain with left-sided sciatica    Findings consistent with sciatica, cause unclear but unlikely to represent spinal myelopathy, fracture, severe musculoskeletal disease or myositis.  Nursing Notes Reviewed/ Care Coordinated Applicable Imaging Reviewed Interpretation of Laboratory Data incorporated into ED treatment  The patient appears reasonably screened and/or stabilized for  discharge and I doubt any other medical condition or other [facility] requiring further screening, evaluation, or treatment in the ED at this time prior to discharge.  Plan: Home Medications-ibuprofen 3 times a day; Home Treatments-cryotherapy advance to heat therapy, rest, gentle stretching; return here if the recommended treatment, does not improve the symptoms; Recommended follow up-PCP, as needed   New Prescriptions New Prescriptions   No medications on file     Daleen Bo, MD 03/29/17 2126

## 2017-03-29 NOTE — ED Notes (Signed)
RN went to get pt to take to treatment room, registration stated pt went to her car for her charger, has not returned

## 2017-03-29 NOTE — ED Triage Notes (Signed)
Reports of left lower back pain that radiates down left leg x1 week. Denies injury. States she has been taking AZO although denied any urinary symptoms.

## 2017-04-22 ENCOUNTER — Encounter: Payer: Self-pay | Admitting: *Deleted

## 2017-04-22 ENCOUNTER — Ambulatory Visit: Payer: Self-pay | Admitting: Obstetrics & Gynecology

## 2017-04-22 NOTE — Progress Notes (Signed)
Pt dnka today. Per Dr. Harolyn Rutherford pt can reschedule if she desires.

## 2017-05-17 ENCOUNTER — Emergency Department (HOSPITAL_COMMUNITY)
Admission: EM | Admit: 2017-05-17 | Discharge: 2017-05-17 | Disposition: A | Discharge status: Home / Self Care | Payer: Self-pay | Attending: Emergency Medicine | Admitting: Emergency Medicine

## 2017-05-17 ENCOUNTER — Emergency Department (HOSPITAL_COMMUNITY): Payer: Self-pay

## 2017-05-17 ENCOUNTER — Encounter (HOSPITAL_COMMUNITY): Payer: Self-pay | Admitting: *Deleted

## 2017-05-17 DIAGNOSIS — N23 Unspecified renal colic: Secondary | ICD-10-CM | POA: Insufficient documentation

## 2017-05-17 DIAGNOSIS — R109 Unspecified abdominal pain: Secondary | ICD-10-CM

## 2017-05-17 DIAGNOSIS — F17210 Nicotine dependence, cigarettes, uncomplicated: Secondary | ICD-10-CM | POA: Insufficient documentation

## 2017-05-17 LAB — CBC WITH DIFFERENTIAL/PLATELET
Basophils Absolute: 0 10*3/uL (ref 0.0–0.1)
Basophils Relative: 0 %
EOS ABS: 0.1 10*3/uL (ref 0.0–0.7)
Eosinophils Relative: 0 %
HCT: 39.1 % (ref 36.0–46.0)
Hemoglobin: 12.9 g/dL (ref 12.0–15.0)
LYMPHS ABS: 2.2 10*3/uL (ref 0.7–4.0)
LYMPHS PCT: 12 %
MCH: 27.7 pg (ref 26.0–34.0)
MCHC: 33 g/dL (ref 30.0–36.0)
MCV: 84.1 fL (ref 78.0–100.0)
Monocytes Absolute: 0.8 10*3/uL (ref 0.1–1.0)
Monocytes Relative: 4 %
NEUTROS ABS: 15.6 10*3/uL — AB (ref 1.7–7.7)
Neutrophils Relative %: 84 %
Platelets: 234 10*3/uL (ref 150–400)
RBC: 4.65 MIL/uL (ref 3.87–5.11)
RDW: 13.8 % (ref 11.5–15.5)
WBC: 18.7 10*3/uL — AB (ref 4.0–10.5)

## 2017-05-17 LAB — BASIC METABOLIC PANEL
Anion gap: 9 (ref 5–15)
BUN: 10 mg/dL (ref 6–20)
CHLORIDE: 102 mmol/L (ref 101–111)
CO2: 27 mmol/L (ref 22–32)
Calcium: 9 mg/dL (ref 8.9–10.3)
Creatinine, Ser: 1.03 mg/dL — ABNORMAL HIGH (ref 0.44–1.00)
GFR calc Af Amer: >60 mL/min (ref >60)
GFR calc non Af Amer: >60 mL/min (ref >60)
Glucose, Bld: 156 mg/dL — ABNORMAL HIGH (ref 65–99)
POTASSIUM: 3.2 mmol/L — AB (ref 3.5–5.1)
SODIUM: 138 mmol/L (ref 135–145)

## 2017-05-17 LAB — URINALYSIS, ROUTINE W REFLEX MICROSCOPIC
BILIRUBIN URINE: NEGATIVE
Bacteria, UA: NONE SEEN
Glucose, UA: NEGATIVE mg/dL
Ketones, ur: NEGATIVE mg/dL
Leukocytes, UA: NEGATIVE
Nitrite: NEGATIVE
Protein, ur: 30 mg/dL — AB
SPECIFIC GRAVITY, URINE: 1.028 (ref 1.005–1.030)
pH: 5 (ref 5.0–8.0)

## 2017-05-17 LAB — I-STAT BETA HCG BLOOD, ED (MC, WL, AP ONLY)

## 2017-05-17 MED ORDER — SODIUM CHLORIDE 0.9 % IV BOLUS (SEPSIS)
1000.0000 mL | Freq: Once | INTRAVENOUS | Status: AC
  Administered 2017-05-17: 1000 mL via INTRAVENOUS

## 2017-05-17 MED ORDER — ONDANSETRON HCL 4 MG/2ML IJ SOLN
4.0000 mg | Freq: Once | INTRAMUSCULAR | Status: AC
  Administered 2017-05-17: 4 mg via INTRAVENOUS
  Filled 2017-05-17: qty 2

## 2017-05-17 MED ORDER — HYDROMORPHONE HCL 1 MG/ML IJ SOLN
1.0000 mg | Freq: Once | INTRAMUSCULAR | Status: AC
  Administered 2017-05-17: 1 mg via INTRAVENOUS
  Filled 2017-05-17: qty 1

## 2017-05-17 MED ORDER — ONDANSETRON 4 MG PO TBDP
4.0000 mg | ORAL_TABLET | Freq: Three times a day (TID) | ORAL | 0 refills | Status: DC | PRN
Start: 2017-05-17 — End: 2017-08-30

## 2017-05-17 MED ORDER — OXYCODONE-ACETAMINOPHEN 5-325 MG PO TABS: 1.0000 | ORAL_TABLET | ORAL | 0 refills | Status: DC | PRN

## 2017-05-17 MED ORDER — PROMETHAZINE HCL 25 MG/ML IJ SOLN
12.5000 mg | Freq: Once | INTRAMUSCULAR | Status: AC
  Administered 2017-05-17: 12.5 mg via INTRAVENOUS
  Filled 2017-05-17: qty 1

## 2017-05-17 MED ORDER — IBUPROFEN 800 MG PO TABS: 800.0000 mg | ORAL_TABLET | Freq: Three times a day (TID) | ORAL | 0 refills | Status: DC

## 2017-05-17 MED ORDER — KETOROLAC TROMETHAMINE 30 MG/ML IJ SOLN
30.0000 mg | Freq: Once | INTRAMUSCULAR | Status: AC
  Administered 2017-05-17: 30 mg via INTRAVENOUS
  Filled 2017-05-17: qty 1

## 2017-05-17 NOTE — ED Notes (Signed)
Patient transported to CT 

## 2017-05-17 NOTE — ED Notes (Signed)
Pt given a drink.

## 2017-05-17 NOTE — ED Notes (Signed)
Pt alert & oriented x4, stable gait. Patient given discharge instructions, paperwork & prescription(s). Patient informed not to drive, operate any equipment & handel any important documents 4 hours after taking pain medication. Patient instructed to stop at the registration desk to finish any additional paperwork. Patient verbalized understanding. Pt left department in wheelchair escorted by staff. Pt left w/ no further questions. 

## 2017-05-17 NOTE — Discharge Instructions (Signed)
Take the pain and nausea medicines as prescribed. Follow up with the urologist. Return to the ED if you develop new or worsening symptoms.

## 2017-05-17 NOTE — ED Provider Notes (Signed)
[facility] DEPT Provider Note   CSN: [id number] Arrival date & time: 05/17/17  0249     History   Chief Complaint Chief Complaint  Patient presents with  . Flank Pain    HPI Amber Ray is a 26 y.o. female.  Patient has a severe right flank pain that started about 2 hours ago that is constant. Pain radiates around the front of her abdomen. She has had multiple episodes of nausea and vomiting. No diarrhea. No fever. Denies dysuria hematuria. No vaginal bleeding or discharge. Last menstrual cycle last week. Denies possibility of pregnancy. No previous history of kidney stones. Patient reports history of PCOS and uterine fibroids. Has never had pain like this in the past.   The history is provided by the patient.  Flank Pain  Associated symptoms include abdominal pain. Pertinent negatives include no chest pain, no headaches and no shortness of breath.    Past Medical History:  Diagnosis Date  . Anxiety   . Bipolar affective disorder, depressed, mild ([place])   . Chlamydia   . Depression   . Dermoid cyst   . Dermoid cyst of ovary 08/2011   2.5 cm on right ovary  . Gonorrhea   . PCOS (polycystic ovarian syndrome)     Patient Active Problem List   Diagnosis Date Noted  . Vaginal lesion 01/30/2015  . Menorrhagia with irregular cycle 12/19/2014  . PCOS (polycystic ovarian syndrome) 08/21/2013  . Dermoid cyst 07/06/2012    Past Surgical History:  Procedure Laterality Date  . HYSTEROSCOPY W/D&C N/A 01/30/2015   Procedure: DILATATION AND CURETTAGE /HYSTEROSCOPY;  Surgeon: Donnamae Jude, MD;  Location: [facility];  Service: Gynecology;  Laterality: N/A;  . LAPAROSCOPY  07/27/2012   Procedure: LAPAROSCOPY OPERATIVE;  Surgeon: Mora Bellman, MD;  Location: [facility];  Service: Gynecology;  Laterality: N/A;  . OVARIAN CYST REMOVAL  07/27/2012   Procedure: OVARIAN CYSTECTOMY;  Surgeon: Mora Bellman, MD;  Location: [facility];  Service: Gynecology;  Laterality: Right;  Dermoid Cyst     OB History    Gravida Para Term Preterm AB Living   0       0 0   SAB TAB Ectopic Multiple Live Births   0               Home Medications    Prior to Admission medications   Medication Sig Start Date End Date Taking? Authorizing Provider  phenazopyridine (AZO-STANDARD) 95 MG tablet Take 95 mg by mouth 3 (three) times daily as needed for pain.    [provider]    Family History Family History  Problem Relation Age of Onset  . Diabetes Maternal Grandmother   . Arthritis Maternal Grandmother   . Arthritis Maternal Grandfather   . Diabetes Paternal Grandmother   . Hypertension Paternal Grandmother   . Arthritis Paternal Grandmother   . Kidney disease Paternal Grandmother   . Arthritis Paternal Grandfather     Social History Social History  Substance Use Topics  . Smoking status: Current Every Day Smoker    Packs/day: 0.50    Years: 6.00    Types: Cigarettes  . Smokeless tobacco: Never Used  . Alcohol use 0.5 oz/week    1 Standard drinks or equivalent per week     Comment: occasionally     Allergies   Patient has no known allergies.   Review of Systems Review of Systems  Constitutional: Negative for activity change, appetite change and fever.  HENT: Negative for congestion  and rhinorrhea.   Respiratory: Negative for cough, chest tightness and shortness of breath.   Cardiovascular: Negative for chest pain.  Gastrointestinal: Positive for abdominal pain, nausea and vomiting.  Genitourinary: Positive for flank pain. Negative for difficulty urinating, dysuria, hematuria, pelvic pain, urgency, vaginal bleeding and vaginal discharge.  Musculoskeletal: Positive for back pain. Negative for arthralgias and myalgias.  Neurological: Negative for dizziness, weakness, light-headedness and headaches.    all other systems are negative except as noted in the HPI and PMH.    Physical Exam Updated Vital Signs BP (!) 112/58 (BP Location: Right Arm)   Pulse 60    Temp 98.5 F (36.9 C) (Oral)   Resp 20   Ht 5\' 4"  (1.626 m)   Wt 84.4 kg (186 lb)   LMP 05/05/2017   SpO2 99%   BMI 31.93 kg/m   Physical Exam  Constitutional: She is oriented to person, place, and time. She appears well-developed and well-nourished. She appears distressed.  Yelling in pain  HENT:  Head: Normocephalic and atraumatic.  Mouth/Throat: Oropharynx is clear and moist. No oropharyngeal exudate.  Eyes: Conjunctivae and EOM are normal. Pupils are equal, round, and reactive to light.  Neck: Normal range of motion. Neck supple.  No meningismus.  Cardiovascular: Normal rate, regular rhythm, normal heart sounds and intact distal pulses.   No murmur heard. Pulmonary/Chest: Effort normal and breath sounds normal. No respiratory distress.  Abdominal: Soft. There is tenderness. There is no rebound and no guarding.  R sided abdominal tenderness  Musculoskeletal: Normal range of motion. She exhibits tenderness. She exhibits no edema.  R CVAT  Neurological: She is alert and oriented to person, place, and time. No cranial nerve deficit. She exhibits normal muscle tone. Coordination normal.   5/5 strength throughout. CN 2-12 intact.Equal grip strength.   Skin: Skin is warm.  Psychiatric: She has a normal mood and affect. Her behavior is normal.  Nursing note and vitals reviewed.    ED Treatments / Results  Labs (all labs ordered are listed, but only abnormal results are displayed) Labs Reviewed  URINALYSIS, ROUTINE W REFLEX MICROSCOPIC - Abnormal; Notable for the following:       Result Value   APPearance HAZY (*)    Hgb urine dipstick LARGE (*)    Protein, ur 30 (*)    Squamous Epithelial / LPF 6-30 (*)    Crystals PRESENT (*)    All other components within normal limits  CBC WITH DIFFERENTIAL/PLATELET - Abnormal; Notable for the following:    WBC 18.7 (*)    Neutro Abs 15.6 (*)    All other components within normal limits  BASIC METABOLIC PANEL - Abnormal; Notable for  the following:    Potassium 3.2 (*)    Glucose, Bld 156 (*)    Creatinine, Ser 1.03 (*)    All other components within normal limits  I-STAT BETA HCG BLOOD, ED ([facility], [facility], [facility] ONLY)    EKG  EKG Interpretation None       Radiology Ct Renal Stone Study  Result Date: 05/17/2017 CLINICAL DATA:  Initial evaluation for acute right flank pain. EXAM: CT ABDOMEN AND PELVIS WITHOUT CONTRAST TECHNIQUE: Multidetector CT imaging of the abdomen and pelvis was performed following the standard protocol without IV contrast. COMPARISON:  Prior CT from 04/29/2016. FINDINGS: Lower chest: Visualized lung bases are clear. Hepatobiliary: Liver demonstrates a normal unenhanced appearance. Gallbladder within normal limits. No biliary dilatation. Pancreas: Pancreas within normal limits. Spleen: Spleen within normal limits. Adrenals/Urinary Tract:  Adrenal glands within normal limits. On the right, there is an obstructive 3 mm stone just proximal to the right UVJ with secondary mild to moderate right hydroureteronephrosis. Right periureteral fat stranding. No other radiopaque calculi seen within the right kidney or along the course of the right renal collecting system. On the left, punctate nonobstructive 2 mm stone present within the interpolar region. No other radiopaque calculi seen along the course of the left renal collecting system. No left-sided hydronephrosis or hydroureter. Bladder largely decompressed. No layering stones within the bladder lumen. Stomach/Bowel: Stomach within normal limits. No evidence for bowel obstruction. Appendix normal. No acute inflammatory changes seen about the bowels. Vascular/Lymphatic: Intra-abdominal aorta of normal caliber. No adenopathy. Reproductive: Uterus and ovaries within normal limits for age. Other: No free air or fluid. Small fat containing paraumbilical hernia noted. Musculoskeletal: No acute osseous abnormality. No worrisome lytic or blastic osseous lesions. IMPRESSION: 1. 3 mm  obstructive stone within the distal right ureter with secondary mild to moderate right hydroureteronephrosis. 2. Punctate nonobstructive left renal nephrolithiasis. 3. No other acute intra-abdominal or pelvic process. Electronically Signed   By: Jeannine Boga M.D.   On: 05/17/2017 04:27    Procedures Procedures (including critical care time)  Medications Ordered in ED Medications  sodium chloride 0.9 % bolus 1,000 mL (not administered)  ondansetron (ZOFRAN) injection 4 mg (not administered)  HYDROmorphone (DILAUDID) injection 1 mg (not administered)  ketorolac (TORADOL) 30 MG/ML injection 30 mg (not administered)     Initial Impression / Assessment and Plan / ED Course  I have reviewed the triage vital signs and the nursing notes.  Pertinent labs & imaging results that were available during my care of the patient were reviewed by me and considered in my medical decision making (see chart for details).     Sudden onset of flank pain with nausea and vomiting. Presentation concerning for kidney stone.  Labs show leukocytosis which appears to be chronic. Urinalysis is positive for hematuria without infection.  CT confirms 3 mm distal right-sided stone.  Patient improved with treatment in the ED. She is tolerating by mouth and pain is controlled. She'll be treated for ureteral colic. No evidence of infection. She appears to have chronic leukocytosis.  Follow-up with urology. Return precautions discussed including not able to tolerate by mouth, fever and worsening pain. Final Clinical Impressions(s) / ED Diagnoses   Final diagnoses:  Flank pain  Ureteral colic    New Prescriptions New Prescriptions   No medications on file     Ezequiel Essex, MD 05/17/17 [phone]

## 2017-05-17 NOTE — ED Triage Notes (Signed)
Pt states right sided flank pain that started about 2 hours ago.

## 2017-07-15 ENCOUNTER — Ambulatory Visit (HOSPITAL_COMMUNITY)
Admission: EM | Admit: 2017-07-15 | Discharge: 2017-07-15 | Disposition: A | Discharge status: Home / Self Care | Payer: Self-pay | Attending: Internal Medicine | Admitting: Internal Medicine

## 2017-07-15 ENCOUNTER — Encounter (HOSPITAL_COMMUNITY): Payer: Self-pay | Admitting: Family Medicine

## 2017-07-15 DIAGNOSIS — K0889 Other specified disorders of teeth and supporting structures: Secondary | ICD-10-CM

## 2017-07-15 DIAGNOSIS — K029 Dental caries, unspecified: Secondary | ICD-10-CM

## 2017-07-15 MED ORDER — HYDROCODONE-ACETAMINOPHEN 5-325 MG PO TABS: 1.0000 | ORAL_TABLET | ORAL | 0 refills | Status: DC | PRN

## 2017-07-15 MED ORDER — AMOXICILLIN 500 MG PO CAPS: 1000.0000 mg | ORAL_CAPSULE | Freq: Two times a day (BID) | ORAL | 0 refills | Status: DC

## 2017-07-15 NOTE — ED Provider Notes (Signed)
[facility]    CSN: [id number] Arrival date & time: 07/15/17  1907     History   Chief Complaint Chief Complaint  Patient presents with  . Dental Pain    HPI Amber Ray is a 26 y.o. female.   26 year old female presents to the urgent care with toothaches that she has had off and on for at least a year. She is unable to obtain a PCP or dentist. The primary teeth are the left lower second and third molars which are severely cavernous. She also has still tenderness in the left upper second and third molar. Minor swelling of the left face.      Past Medical History:  Diagnosis Date  . Anxiety   . Bipolar affective disorder, depressed, mild ([place])   . Chlamydia   . Depression   . Dermoid cyst   . Dermoid cyst of ovary 08/2011   2.5 cm on right ovary  . Gonorrhea   . PCOS (polycystic ovarian syndrome)     Patient Active Problem List   Diagnosis Date Noted  . Vaginal lesion 01/30/2015  . Menorrhagia with irregular cycle 12/19/2014  . PCOS (polycystic ovarian syndrome) 08/21/2013  . Dermoid cyst 07/06/2012    Past Surgical History:  Procedure Laterality Date  . HYSTEROSCOPY W/D&C N/A 01/30/2015   Procedure: DILATATION AND CURETTAGE /HYSTEROSCOPY;  Surgeon: Donnamae Jude, MD;  Location: [facility];  Service: Gynecology;  Laterality: N/A;  . LAPAROSCOPY  07/27/2012   Procedure: LAPAROSCOPY OPERATIVE;  Surgeon: Mora Bellman, MD;  Location: [facility];  Service: Gynecology;  Laterality: N/A;  . OVARIAN CYST REMOVAL  07/27/2012   Procedure: OVARIAN CYSTECTOMY;  Surgeon: Mora Bellman, MD;  Location: [facility];  Service: Gynecology;  Laterality: Right;  Dermoid Cyst    OB History    Gravida Para Term Preterm AB Living   0       0 0   SAB TAB Ectopic Multiple Live Births   0               Home Medications    Prior to Admission medications   Medication Sig Start Date End Date Taking? Authorizing Provider  amoxicillin (AMOXIL) 500 MG capsule Take 2 capsules  (1,000 mg total) by mouth 2 (two) times daily. 07/15/17   Janne Napoleon, NP  HYDROcodone-acetaminophen (NORCO/VICODIN) 5-325 MG tablet Take 1 tablet by mouth every 4 (four) hours as needed. 07/15/17   Janne Napoleon, NP  ibuprofen (ADVIL,MOTRIN) 800 MG tablet Take 1 tablet (800 mg total) by mouth 3 (three) times daily. 05/17/17   Rancour, Annie Main, MD  ondansetron (ZOFRAN ODT) 4 MG disintegrating tablet Take 1 tablet (4 mg total) by mouth every 8 (eight) hours as needed for nausea or vomiting. 05/17/17   Rancour, Annie Main, MD  oxyCODONE-acetaminophen (PERCOCET/ROXICET) 5-325 MG tablet Take 1 tablet by mouth every 4 (four) hours as needed for severe pain. 05/17/17   Rancour, Annie Main, MD  phenazopyridine (AZO-STANDARD) 95 MG tablet Take 95 mg by mouth 3 (three) times daily as needed for pain.    [provider]    Family History Family History  Problem Relation Age of Onset  . Diabetes Maternal Grandmother   . Arthritis Maternal Grandmother   . Arthritis Maternal Grandfather   . Diabetes Paternal Grandmother   . Hypertension Paternal Grandmother   . Arthritis Paternal Grandmother   . Kidney disease Paternal Grandmother   . Arthritis Paternal Grandfather     Social History Social History  Substance  Use Topics  . Smoking status: Current Every Day Smoker    Packs/day: 0.50    Years: 6.00    Types: Cigarettes  . Smokeless tobacco: Never Used  . Alcohol use 0.5 oz/week    1 Standard drinks or equivalent per week     Comment: occasionally     Allergies   Patient has no known allergies.   Review of Systems Review of Systems  Constitutional: Negative.  Negative for fever.  HENT: Positive for dental problem.   Respiratory: Negative.   All other systems reviewed and are negative.    Physical Exam Triage Vital Signs ED Triage Vitals [07/15/17 1925]  Enc Vitals Group     BP 115/74     Pulse Rate 91     Resp 18     Temp 98.4 F (36.9 C)     Temp src      SpO2 100 %     Weight       Height      Head Circumference      Peak Flow      Pain Score 10     Pain Loc      Pain Edu?      Excl. in [place]?    No data found.   Updated Vital Signs BP 115/74   Pulse 91   Temp 98.4 F (36.9 C)   Resp 18   SpO2 100%   Visual Acuity Right Eye Distance:   Left Eye Distance:   Bilateral Distance:    Right Eye Near:   Left Eye Near:    Bilateral Near:     Physical Exam  Constitutional: She is oriented to person, place, and time. She appears well-developed and well-nourished. No distress.  HENT:  Left lower second and third molars with the majority of the Dentin decayed. Large cavernous area the middle both teeth. Mild surrounding gingival erythema and hypertrophy. No abscess seen.  Eyes: EOM are normal.  Neck: Normal range of motion. Neck supple.  Cardiovascular: Normal rate.   Pulmonary/Chest: Effort normal. No respiratory distress.  Musculoskeletal: She exhibits no edema.  Neurological: She is alert and oriented to person, place, and time. She exhibits normal muscle tone.  Skin: Skin is warm and dry.  Psychiatric: She has a normal mood and affect.  Nursing note and vitals reviewed.    UC Treatments / Results  Labs (all labs ordered are listed, but only abnormal results are displayed) Labs Reviewed - No data to display  EKG  EKG Interpretation None       Radiology No results found.  Procedures Procedures (including critical care time)  Medications Ordered in UC Medications - No data to display   Initial Impression / Assessment and Plan / UC Course  I have reviewed the triage vital signs and the nursing notes.  Pertinent labs & imaging results that were available during my care of the patient were reviewed by me and considered in my medical decision making (see chart for details).       Final Clinical Impressions(s) / UC Diagnoses   Final diagnoses:  Pain, dental  Dental caries    New Prescriptions New Prescriptions   AMOXICILLIN  (AMOXIL) 500 MG CAPSULE    Take 2 capsules (1,000 mg total) by mouth 2 (two) times daily.   HYDROCODONE-ACETAMINOPHEN (NORCO/VICODIN) 5-325 MG TABLET    Take 1 tablet by mouth every 4 (four) hours as needed.     Controlled Substance Prescriptions [place] Controlled Substance Registry  consulted? Yes, I have consulted the [place] Controlled Substances Registry for this patient, and feel the risk/benefit ratio today is favorable for proceeding with this prescription for a controlled substance.   Janne Napoleon, NP 07/15/17 2036

## 2017-07-15 NOTE — ED Triage Notes (Signed)
Pt here for left sided dental pain. Pain into left ear. Hx of dental caries.

## 2017-07-15 NOTE — Discharge Instructions (Addendum)
Seek dentist as soon as possible

## 2017-07-18 ENCOUNTER — Inpatient Hospital Stay (HOSPITAL_COMMUNITY)
Admission: AD | Admit: 2017-07-18 | Discharge: 2017-07-18 | Disposition: A | Discharge status: Home / Self Care | Payer: Self-pay | Source: Ambulatory Visit | Attending: Obstetrics and Gynecology | Admitting: Obstetrics and Gynecology

## 2017-07-18 ENCOUNTER — Other Ambulatory Visit: Payer: Self-pay | Admitting: Nurse Practitioner

## 2017-07-18 ENCOUNTER — Encounter (HOSPITAL_COMMUNITY): Payer: Self-pay | Admitting: *Deleted

## 2017-07-18 DIAGNOSIS — N63 Unspecified lump in unspecified breast: Secondary | ICD-10-CM | POA: Insufficient documentation

## 2017-07-18 DIAGNOSIS — N6320 Unspecified lump in the left breast, unspecified quadrant: Principal | ICD-10-CM

## 2017-07-18 DIAGNOSIS — N6325 Unspecified lump in the left breast, overlapping quadrants: Secondary | ICD-10-CM

## 2017-07-18 DIAGNOSIS — F17210 Nicotine dependence, cigarettes, uncomplicated: Secondary | ICD-10-CM | POA: Insufficient documentation

## 2017-07-18 LAB — POCT PREGNANCY, URINE: Preg Test, Ur: NEGATIVE

## 2017-07-18 NOTE — MAU Provider Note (Signed)
[facility] Provider Note   CSN: [id number] Arrival date & time: 07/18/17  1733     History   Chief Complaint Chief Complaint  Patient presents with  . Breast Mass    HPI Amber Ray is a 26 y.o. G0 presents to the MAU with a lump to the left breast that appeared 2 weeks ago and has gotten larger and is now slightly painful. Patient denies any other problems.   HPI  Past Medical History:  Diagnosis Date  . Anxiety   . Bipolar affective disorder, depressed, mild ([place])   . Chlamydia   . Depression   . Dermoid cyst   . Dermoid cyst of ovary 08/2011   2.5 cm on right ovary  . Gonorrhea   . PCOS (polycystic ovarian syndrome)     Patient Active Problem List   Diagnosis Date Noted  . Vaginal lesion 01/30/2015  . Menorrhagia with irregular cycle 12/19/2014  . PCOS (polycystic ovarian syndrome) 08/21/2013  . Dermoid cyst 07/06/2012    Past Surgical History:  Procedure Laterality Date  . HYSTEROSCOPY W/D&C N/A 01/30/2015   Procedure: DILATATION AND CURETTAGE /HYSTEROSCOPY;  Surgeon: Donnamae Jude, MD;  Location: [facility];  Service: Gynecology;  Laterality: N/A;  . LAPAROSCOPY  07/27/2012   Procedure: LAPAROSCOPY OPERATIVE;  Surgeon: Mora Bellman, MD;  Location: [facility];  Service: Gynecology;  Laterality: N/A;  . OVARIAN CYST REMOVAL  07/27/2012   Procedure: OVARIAN CYSTECTOMY;  Surgeon: Mora Bellman, MD;  Location: [facility];  Service: Gynecology;  Laterality: Right;  Dermoid Cyst    OB History    Gravida Para Term Preterm AB Living   0       0 0   SAB TAB Ectopic Multiple Live Births   0               Home Medications    Prior to Admission medications   Medication Sig Start Date End Date Taking? Authorizing Provider  amoxicillin (AMOXIL) 500 MG capsule Take 2 capsules (1,000 mg total) by mouth 2 (two) times daily. 07/15/17   Janne Napoleon, NP  HYDROcodone-acetaminophen (NORCO/VICODIN) 5-325 MG tablet Take 1 tablet by mouth every 4 (four) hours as needed.  07/15/17   Janne Napoleon, NP  ibuprofen (ADVIL,MOTRIN) 800 MG tablet Take 1 tablet (800 mg total) by mouth 3 (three) times daily. 05/17/17   Rancour, Annie Main, MD  ondansetron (ZOFRAN ODT) 4 MG disintegrating tablet Take 1 tablet (4 mg total) by mouth every 8 (eight) hours as needed for nausea or vomiting. 05/17/17   Rancour, Annie Main, MD  oxyCODONE-acetaminophen (PERCOCET/ROXICET) 5-325 MG tablet Take 1 tablet by mouth every 4 (four) hours as needed for severe pain. 05/17/17   Rancour, Annie Main, MD  phenazopyridine (AZO-STANDARD) 95 MG tablet Take 95 mg by mouth 3 (three) times daily as needed for pain.    [provider]    Family History Family History  Problem Relation Age of Onset  . Diabetes Maternal Grandmother   . Arthritis Maternal Grandmother   . Arthritis Maternal Grandfather   . Diabetes Paternal Grandmother   . Hypertension Paternal Grandmother   . Arthritis Paternal Grandmother   . Kidney disease Paternal Grandmother   . Arthritis Paternal Grandfather     Social History Social History  Substance Use Topics  . Smoking status: Current Every Day Smoker    Packs/day: 0.50    Years: 6.00    Types: Cigarettes  . Smokeless tobacco: Never Used  . Alcohol use 0.5  oz/week    1 Standard drinks or equivalent per week     Comment: occasionally     Allergies   Patient has no known allergies.   Review of Systems Review of Systems  Respiratory:       Left breast lump and tenderness     Physical Exam Updated Vital Signs BP 112/64 (BP Location: Right Arm)   Pulse 75   Temp 97.7 F (36.5 C) (Oral)   Resp 16   LMP  (LMP Unknown)   Physical Exam  Constitutional: She appears well-developed and well-nourished. No distress.  HENT:  Head: Normocephalic.  Neck: Neck supple.  Cardiovascular: Normal rate.   Pulmonary/Chest: Effort normal.  Musculoskeletal: Normal range of motion.  Neurological: She is alert.  Psychiatric: She has a normal mood and affect.  Nursing note  and vitals reviewed. left breast with 2 cm tender firm area at 6 o'clock, no axillary nodes palpated.    ED Treatments / Results  Labs (all labs ordered are listed, but only abnormal results are displayed) Labs Reviewed - No data to display   Radiology No results found.  Procedures Procedures (including critical care time)  Medications Ordered in ED Medications - No data to display   Initial Impression / Assessment and Plan / ED Course  I have reviewed the triage vital signs and the nursing notes.   26 y.o. female with 2 week hx of left breast mass that has gotten worse and is now painful. No red streaking, increased warmth, fever or signs of infection.  Patient to f/u with [facility]. Order entered for breast ultrasound.   Final Clinical Impressions(s) / ED Diagnoses   Left breast lump  New Prescriptions Current Discharge Medication List

## 2017-07-18 NOTE — MAU Note (Signed)
Pt states she has lump under her L breast for 2 weeks, has become very painful.

## 2017-07-21 ENCOUNTER — Ambulatory Visit
Admission: RE | Admit: 2017-07-21 | Discharge: 2017-07-21 | Disposition: A | Discharge status: Home / Self Care | Payer: No Typology Code available for payment source | Source: Ambulatory Visit | Attending: Nurse Practitioner | Admitting: Nurse Practitioner

## 2017-07-21 ENCOUNTER — Telehealth (HOSPITAL_COMMUNITY): Payer: Self-pay | Admitting: *Deleted

## 2017-07-21 DIAGNOSIS — N6325 Unspecified lump in the left breast, overlapping quadrants: Secondary | ICD-10-CM

## 2017-07-21 DIAGNOSIS — N6320 Unspecified lump in the left breast, unspecified quadrant: Principal | ICD-10-CM

## 2017-07-27 ENCOUNTER — Other Ambulatory Visit: Payer: Self-pay

## 2017-08-09 DIAGNOSIS — Z87442 Personal history of urinary calculi: Secondary | ICD-10-CM

## 2017-08-09 HISTORY — DX: Personal history of urinary calculi: Z87.442

## 2017-08-30 ENCOUNTER — Encounter: Payer: Self-pay | Admitting: Emergency Medicine

## 2017-08-30 ENCOUNTER — Emergency Department (HOSPITAL_COMMUNITY): Payer: Self-pay

## 2017-08-30 ENCOUNTER — Emergency Department (HOSPITAL_COMMUNITY)
Admission: EM | Admit: 2017-08-30 | Discharge: 2017-08-30 | Disposition: A | Discharge status: Home / Self Care | Payer: Self-pay | Attending: Emergency Medicine | Admitting: Emergency Medicine

## 2017-08-30 DIAGNOSIS — R1012 Left upper quadrant pain: Secondary | ICD-10-CM | POA: Insufficient documentation

## 2017-08-30 DIAGNOSIS — R110 Nausea: Secondary | ICD-10-CM | POA: Insufficient documentation

## 2017-08-30 DIAGNOSIS — F17210 Nicotine dependence, cigarettes, uncomplicated: Secondary | ICD-10-CM | POA: Insufficient documentation

## 2017-08-30 DIAGNOSIS — R109 Unspecified abdominal pain: Secondary | ICD-10-CM

## 2017-08-30 DIAGNOSIS — Z79899 Other long term (current) drug therapy: Secondary | ICD-10-CM | POA: Insufficient documentation

## 2017-08-30 LAB — CBC WITH DIFFERENTIAL/PLATELET
Basophils Absolute: 0 10*3/uL (ref 0.0–0.1)
Basophils Relative: 0 %
Eosinophils Absolute: 0.4 10*3/uL (ref 0.0–0.7)
Eosinophils Relative: 3 %
HEMATOCRIT: 44.2 % (ref 36.0–46.0)
HEMOGLOBIN: 14.8 g/dL (ref 12.0–15.0)
LYMPHS ABS: 2.4 10*3/uL (ref 0.7–4.0)
LYMPHS PCT: 20 %
MCH: 28.6 pg (ref 26.0–34.0)
MCHC: 33.5 g/dL (ref 30.0–36.0)
MCV: 85.5 fL (ref 78.0–100.0)
MONOS PCT: 5 %
Monocytes Absolute: 0.6 10*3/uL (ref 0.1–1.0)
NEUTROS ABS: 8.9 10*3/uL — AB (ref 1.7–7.7)
NEUTROS PCT: 72 %
Platelets: 217 10*3/uL (ref 150–400)
RBC: 5.17 MIL/uL — AB (ref 3.87–5.11)
RDW: 13.1 % (ref 11.5–15.5)
WBC: 12.3 10*3/uL — AB (ref 4.0–10.5)

## 2017-08-30 LAB — COMPREHENSIVE METABOLIC PANEL
ALBUMIN: 4 g/dL (ref 3.5–5.0)
ALK PHOS: 68 U/L (ref 38–126)
ALT: 18 U/L (ref 14–54)
ANION GAP: 8 (ref 5–15)
AST: 21 U/L (ref 15–41)
BILIRUBIN TOTAL: 0.4 mg/dL (ref 0.3–1.2)
BUN: 6 mg/dL (ref 6–20)
CALCIUM: 9.1 mg/dL (ref 8.9–10.3)
CO2: 25 mmol/L (ref 22–32)
CREATININE: 0.77 mg/dL (ref 0.44–1.00)
Chloride: 104 mmol/L (ref 101–111)
GFR calc non Af Amer: >60 mL/min (ref >60)
GLUCOSE: 97 mg/dL (ref 65–99)
Potassium: 4 mmol/L (ref 3.5–5.1)
Sodium: 137 mmol/L (ref 135–145)
TOTAL PROTEIN: 7.2 g/dL (ref 6.5–8.1)

## 2017-08-30 LAB — URINALYSIS, MICROSCOPIC (REFLEX)
Squamous Epithelial / LPF: NONE SEEN
WBC, UA: NONE SEEN WBC/hpf (ref 0–5)

## 2017-08-30 LAB — URINALYSIS, ROUTINE W REFLEX MICROSCOPIC
BILIRUBIN URINE: NEGATIVE
GLUCOSE, UA: NEGATIVE mg/dL
KETONES UR: NEGATIVE mg/dL
LEUKOCYTES UA: NEGATIVE
Nitrite: NEGATIVE
PROTEIN: NEGATIVE mg/dL
Specific Gravity, Urine: >1.03 — ABNORMAL HIGH (ref 1.005–1.030)
pH: 5.5 (ref 5.0–8.0)

## 2017-08-30 LAB — I-STAT BETA HCG BLOOD, ED (MC, WL, AP ONLY)

## 2017-08-30 MED ORDER — ONDANSETRON HCL 4 MG/2ML IJ SOLN
4.0000 mg | Freq: Once | INTRAMUSCULAR | Status: AC
  Administered 2017-08-30: 4 mg via INTRAVENOUS
  Filled 2017-08-30: qty 2

## 2017-08-30 MED ORDER — ONDANSETRON 4 MG PO TBDP: ORAL_TABLET | ORAL | 0 refills | Status: DC

## 2017-08-30 MED ORDER — KETOROLAC TROMETHAMINE 30 MG/ML IJ SOLN
30.0000 mg | Freq: Once | INTRAMUSCULAR | Status: AC
  Administered 2017-08-30: 30 mg via INTRAVENOUS
  Filled 2017-08-30: qty 1

## 2017-08-30 MED ORDER — HYDROMORPHONE HCL 1 MG/ML IJ SOLN
1.0000 mg | Freq: Once | INTRAMUSCULAR | Status: AC
Start: 2017-08-30 — End: 2017-08-30
  Administered 2017-08-30: 1 mg via INTRAVENOUS
  Filled 2017-08-30: qty 1

## 2017-08-30 MED ORDER — HYDROMORPHONE HCL 1 MG/ML IJ SOLN
1.0000 mg | Freq: Once | INTRAMUSCULAR | Status: AC
  Administered 2017-08-30: 1 mg via INTRAVENOUS
  Filled 2017-08-30: qty 1

## 2017-08-30 MED ORDER — TAMSULOSIN HCL 0.4 MG PO CAPS: 0.4000 mg | ORAL_CAPSULE | Freq: Every day | ORAL | 0 refills | Status: DC

## 2017-08-30 MED ORDER — HYDROCODONE-ACETAMINOPHEN 5-325 MG PO TABS: 1.0000 | ORAL_TABLET | Freq: Four times a day (QID) | ORAL | 0 refills | Status: DC | PRN

## 2017-08-30 NOTE — ED Triage Notes (Signed)
Pt c/o left flank pain that radiates to left lower abdomen and nausea that started this morning. Pt denies urinary symptoms, fever, vomiting, diarreha. Pt has used Ibuprofen, back medication patch and warm bath with no relief in pain.

## 2017-08-30 NOTE — Discharge Instructions (Signed)
Follow up with [facility] next week.  Return if problems

## 2017-08-30 NOTE — ED Provider Notes (Signed)
[facility] EMERGENCY DEPARTMENT Provider Note   CSN: [id number] Arrival date & time: 08/30/17  6962     History   Chief Complaint Chief Complaint  Patient presents with  . Flank Pain    HPI Amber Ray is a 26 y.o. female.  Patient complains of left flank pain.  Also nausea   The history is provided by the patient.  Flank Pain  This is a new problem. The current episode started 1 to 2 hours ago. The problem occurs constantly. The problem has not changed since onset.Pertinent negatives include no chest pain, no abdominal pain and no headaches. Nothing aggravates the symptoms. Nothing relieves the symptoms. She has tried nothing for the symptoms. The treatment provided no relief.    Past Medical History:  Diagnosis Date  . Anxiety   . Bipolar affective disorder, depressed, mild ([place])   . Chlamydia   . Depression   . Dermoid cyst   . Dermoid cyst of ovary 08/2011   2.5 cm on right ovary  . Gonorrhea   . PCOS (polycystic ovarian syndrome)     Patient Active Problem List   Diagnosis Date Noted  . Vaginal lesion 01/30/2015  . Menorrhagia with irregular cycle 12/19/2014  . PCOS (polycystic ovarian syndrome) 08/21/2013  . Dermoid cyst 07/06/2012    Past Surgical History:  Procedure Laterality Date  . HYSTEROSCOPY W/D&C N/A 01/30/2015   Procedure: DILATATION AND CURETTAGE /HYSTEROSCOPY;  Surgeon: Donnamae Jude, MD;  Location: [facility];  Service: Gynecology;  Laterality: N/A;  . LAPAROSCOPY  07/27/2012   Procedure: LAPAROSCOPY OPERATIVE;  Surgeon: Mora Bellman, MD;  Location: [facility];  Service: Gynecology;  Laterality: N/A;  . OVARIAN CYST REMOVAL  07/27/2012   Procedure: OVARIAN CYSTECTOMY;  Surgeon: Mora Bellman, MD;  Location: [facility];  Service: Gynecology;  Laterality: Right;  Dermoid Cyst    OB History    Gravida Para Term Preterm AB Living   0       0 0   SAB TAB Ectopic Multiple Live Births   0               Home Medications    Prior to Admission  medications   Medication Sig Start Date End Date Taking? Authorizing Provider  amoxicillin (AMOXIL) 500 MG capsule Take 2 capsules (1,000 mg total) by mouth 2 (two) times daily. Patient not taking: Reported on 08/30/2017 07/15/17   Janne Napoleon, NP  HYDROcodone-acetaminophen (NORCO/VICODIN) 5-325 MG tablet Take 1 tablet by mouth every 6 (six) hours as needed for moderate pain. 08/30/17   Milton Ferguson, MD  ibuprofen (ADVIL,MOTRIN) 800 MG tablet Take 1 tablet (800 mg total) by mouth 3 (three) times daily. Patient not taking: Reported on 08/30/2017 05/17/17   Ezequiel Essex, MD  ondansetron (ZOFRAN ODT) 4 MG disintegrating tablet 4mg  ODT q4 hours prn nausea/vomit 08/30/17   Milton Ferguson, MD  oxyCODONE-acetaminophen (PERCOCET/ROXICET) 5-325 MG tablet Take 1 tablet by mouth every 4 (four) hours as needed for severe pain. Patient not taking: Reported on 08/30/2017 05/17/17   Ezequiel Essex, MD  phenazopyridine (AZO-STANDARD) 95 MG tablet Take 95 mg by mouth 3 (three) times daily as needed for pain.    [provider]  tamsulosin (FLOMAX) 0.4 MG CAPS capsule Take 1 capsule (0.4 mg total) by mouth daily. 08/30/17   Milton Ferguson, MD    Family History Family History  Problem Relation Age of Onset  . Diabetes Maternal Grandmother   . Arthritis Maternal Grandmother   .  Arthritis Maternal Grandfather   . Diabetes Paternal Grandmother   . Hypertension Paternal Grandmother   . Arthritis Paternal Grandmother   . Kidney disease Paternal Grandmother   . Arthritis Paternal Grandfather     Social History Social History  Substance Use Topics  . Smoking status: Current Every Day Smoker    Packs/day: 0.50    Years: 6.00    Types: Cigarettes  . Smokeless tobacco: Never Used  . Alcohol use 0.5 oz/week    1 Standard drinks or equivalent per week     Comment: occasionally     Allergies   Patient has no known allergies.   Review of Systems Review of Systems  Constitutional: Negative  for appetite change and fatigue.  HENT: Negative for congestion, ear discharge and sinus pressure.   Eyes: Negative for discharge.  Respiratory: Negative for cough.   Cardiovascular: Negative for chest pain.  Gastrointestinal: Negative for abdominal pain and diarrhea.  Genitourinary: Positive for flank pain. Negative for frequency and hematuria.  Musculoskeletal: Negative for back pain.  Skin: Negative for rash.  Neurological: Negative for seizures and headaches.  Psychiatric/Behavioral: Negative for hallucinations.     Physical Exam Updated Vital Signs BP 106/67   Pulse 71   Temp 98.3 F (36.8 C) (Oral)   Resp 20   Ht 5\' 4"  (1.626 m)   Wt 85.7 kg (189 lb)   LMP  (Within Months)   SpO2 100%   BMI 32.44 kg/m   Physical Exam  Constitutional: She is oriented to person, place, and time. She appears well-developed.  HENT:  Head: Normocephalic.  Eyes: Conjunctivae and EOM are normal. No scleral icterus.  Neck: Neck supple. No thyromegaly present.  Cardiovascular: Normal rate and regular rhythm.  Exam reveals no gallop and no friction rub.   No murmur heard. Pulmonary/Chest: No stridor. She has no wheezes. She has no rales. She exhibits no tenderness.  Abdominal: She exhibits no distension. There is no tenderness. There is no rebound.  Musculoskeletal: Normal range of motion. She exhibits no edema.  Tender left flank  Lymphadenopathy:    She has no cervical adenopathy.  Neurological: She is oriented to person, place, and time. She exhibits normal muscle tone. Coordination normal.  Skin: No rash noted. No erythema.  Psychiatric: She has a normal mood and affect. Her behavior is normal.     ED Treatments / Results  Labs (all labs ordered are listed, but only abnormal results are displayed) Labs Reviewed  CBC WITH DIFFERENTIAL/PLATELET - Abnormal; Notable for the following:       Result Value   WBC 12.3 (*)    RBC 5.17 (*)    Neutro Abs 8.9 (*)    All other components  within normal limits  URINALYSIS, ROUTINE W REFLEX MICROSCOPIC - Abnormal; Notable for the following:    Specific Gravity, Urine >1.030 (*)    Hgb urine dipstick LARGE (*)    All other components within normal limits  URINALYSIS, MICROSCOPIC (REFLEX) - Abnormal; Notable for the following:    Bacteria, UA FEW (*)    All other components within normal limits  COMPREHENSIVE METABOLIC PANEL  I-STAT BETA HCG BLOOD, ED ([facility], [facility], [facility] ONLY)    EKG  EKG Interpretation None       Radiology Ct Renal Stone Study  Result Date: 08/30/2017 CLINICAL DATA:  Left flank pain and left lower abdominal pain and nausea. EXAM: CT ABDOMEN AND PELVIS WITHOUT CONTRAST TECHNIQUE: Multidetector CT imaging of the abdomen and pelvis  was performed following the standard protocol without IV contrast. COMPARISON:  05/17/2017 FINDINGS: Lower chest: Normal. Hepatobiliary: No focal liver abnormality is seen. No gallstones, gallbladder wall thickening, or biliary dilatation. Pancreas: Unremarkable. No pancreatic ductal dilatation or surrounding inflammatory changes. Spleen: Normal in size without focal abnormality. Adrenals/Urinary Tract: There is new mild left hydronephrosis. There is a 1 mm stone in the distal left ureter just proximal to the bladder. This stone was in the upper pole of the left kidney on the prior exam. The right hydronephrosis seen on the prior study has completely resolved. There are no visible renal calculi. Stomach/Bowel: Stomach is within normal limits. Appendix appears normal. No evidence of bowel wall thickening, distention, or inflammatory changes. Vascular/Lymphatic: No significant vascular findings are present. No enlarged abdominal or pelvic lymph nodes. Reproductive: Uterus and bilateral adnexa are unremarkable. Other: No abdominal wall hernia or abnormality. No abdominopelvic ascites. Musculoskeletal: No acute or significant osseous findings. IMPRESSION: 1 mm stone obstructing the distal left ureter  just proximal to the bladder. Mild left hydronephrosis. Complete resolution of right hydronephrosis since the prior study. Electronically Signed   By: Lorriane Shire M.D.   On: 08/30/2017 11:05    Procedures Procedures (including critical care time)  Medications Ordered in ED Medications  HYDROmorphone (DILAUDID) injection 1 mg (not administered)  ondansetron (ZOFRAN) injection 4 mg (not administered)  HYDROmorphone (DILAUDID) injection 1 mg (1 mg Intravenous Given 08/30/17 1002)  ondansetron (ZOFRAN) injection 4 mg (4 mg Intravenous Given 08/30/17 1002)  ketorolac (TORADOL) 30 MG/ML injection 30 mg (30 mg Intravenous Given 08/30/17 1136)     Initial Impression / Assessment and Plan / ED Course  I have reviewed the triage vital signs and the nursing notes.  Pertinent labs & imaging results that were available during my care of the patient were reviewed by me and considered in my medical decision making (see chart for details).     The patient with a kidney stone.  She will be placed on Flomax Vicodin and Zofran and is referred to urology  Final Clinical Impressions(s) / ED Diagnoses   Final diagnoses:  Flank pain    New Prescriptions New Prescriptions   HYDROCODONE-ACETAMINOPHEN (NORCO/VICODIN) 5-325 MG TABLET    Take 1 tablet by mouth every 6 (six) hours as needed for moderate pain.   ONDANSETRON (ZOFRAN ODT) 4 MG DISINTEGRATING TABLET    4mg  ODT q4 hours prn nausea/vomit   TAMSULOSIN (FLOMAX) 0.4 MG CAPS CAPSULE    Take 1 capsule (0.4 mg total) by mouth daily.     Milton Ferguson, MD 08/30/17 1327

## 2017-09-03 ENCOUNTER — Encounter (HOSPITAL_COMMUNITY): Payer: Self-pay | Admitting: Emergency Medicine

## 2017-09-03 ENCOUNTER — Emergency Department (HOSPITAL_COMMUNITY)
Admission: EM | Admit: 2017-09-03 | Discharge: 2017-09-03 | Disposition: A | Discharge status: Home / Self Care | Payer: No Typology Code available for payment source | Attending: Emergency Medicine | Admitting: Emergency Medicine

## 2017-09-03 DIAGNOSIS — F17210 Nicotine dependence, cigarettes, uncomplicated: Secondary | ICD-10-CM | POA: Insufficient documentation

## 2017-09-03 DIAGNOSIS — N201 Calculus of ureter: Secondary | ICD-10-CM | POA: Insufficient documentation

## 2017-09-03 LAB — URINALYSIS, ROUTINE W REFLEX MICROSCOPIC
BILIRUBIN URINE: NEGATIVE
GLUCOSE, UA: NEGATIVE mg/dL
HGB URINE DIPSTICK: NEGATIVE
KETONES UR: NEGATIVE mg/dL
Leukocytes, UA: NEGATIVE
NITRITE: NEGATIVE
PROTEIN: NEGATIVE mg/dL
Specific Gravity, Urine: 1.029 (ref 1.005–1.030)
pH: 6 (ref 5.0–8.0)

## 2017-09-03 LAB — POC URINE PREG, ED: Preg Test, Ur: NEGATIVE

## 2017-09-03 MED ORDER — ONDANSETRON 8 MG PO TBDP
ORAL_TABLET | ORAL | Status: AC
  Filled 2017-09-03: qty 1

## 2017-09-03 MED ORDER — KETOROLAC TROMETHAMINE 60 MG/2ML IM SOLN
INTRAMUSCULAR | Status: AC
  Filled 2017-09-03: qty 2

## 2017-09-03 MED ORDER — OXYCODONE-ACETAMINOPHEN 5-325 MG PO TABS: 1.0000 | ORAL_TABLET | Freq: Four times a day (QID) | ORAL | 0 refills | Status: DC | PRN

## 2017-09-03 MED ORDER — ONDANSETRON 8 MG PO TBDP
8.0000 mg | ORAL_TABLET | Freq: Once | ORAL | Status: AC
  Administered 2017-09-03: 8 mg via ORAL
  Filled 2017-09-03: qty 1

## 2017-09-03 MED ORDER — FENTANYL CITRATE (PF) 100 MCG/2ML IJ SOLN
50.0000 ug | Freq: Once | INTRAMUSCULAR | Status: AC
  Administered 2017-09-03: 50 ug via INTRAMUSCULAR
  Filled 2017-09-03: qty 2

## 2017-09-03 MED ORDER — CYCLOBENZAPRINE HCL 10 MG PO TABS
5.0000 mg | ORAL_TABLET | Freq: Once | ORAL | Status: AC
  Administered 2017-09-03: 5 mg via ORAL
  Filled 2017-09-03: qty 1

## 2017-09-03 MED ORDER — CYCLOBENZAPRINE HCL 5 MG PO TABS: 5.0000 mg | ORAL_TABLET | Freq: Three times a day (TID) | ORAL | 0 refills | Status: DC | PRN

## 2017-09-03 MED ORDER — NAPROXEN 500 MG PO TABS: ORAL_TABLET | ORAL | 0 refills | Status: DC

## 2017-09-03 MED ORDER — TAMSULOSIN HCL 0.4 MG PO CAPS: ORAL_CAPSULE | ORAL | 0 refills | Status: DC

## 2017-09-03 MED ORDER — FENTANYL CITRATE (PF) 100 MCG/2ML IJ SOLN: 50.0000 ug | Freq: Once | INTRAMUSCULAR | Status: DC

## 2017-09-03 MED ORDER — KETOROLAC TROMETHAMINE 60 MG/2ML IM SOLN
60.0000 mg | Freq: Once | INTRAMUSCULAR | Status: AC
  Administered 2017-09-03: 60 mg via INTRAMUSCULAR
  Filled 2017-09-03: qty 2

## 2017-09-03 NOTE — ED Triage Notes (Signed)
Pt was seen for a kidney stone here and the pain medication is not working

## 2017-09-03 NOTE — Discharge Instructions (Signed)
Drink plenty of fluids. Take the medications as prescribed. You need to take the flomax daily and the naproxen daily. Use the pain pills as needed.  Follow up with [facility] if you aren't passing the stone. Call the office and tell them you have a "1 mm stone in the left distal UVJ".

## 2017-09-03 NOTE — ED Provider Notes (Signed)
[facility] EMERGENCY DEPARTMENT Provider Note   CSN: [id number] Arrival date & time: 09/03/17  4098  Time seen 06:00 AM   History   Chief Complaint Chief Complaint  Patient presents with  . Nephrolithiasis    HPI Amber Ray is a 26 y.o. female.  HPI patient was seen in the ED on October 22 with left flank pain and was found to have a left ureteral stone that was 1 mm in size.  She states she has been having some mild nagging pain since then however today about 3:00 when she got done for work it started getting worse and then at 1 AM the pain started getting more intense and she was unable to sleep.  She states she has been soaking in a hot tub and using a heating pad without relief.  She states she took her pain pills without relief.  She has had nausea and vomiting tonight but denies hematuria.  She states nothing she does makes the pain worse, nothing makes it feel better.  She is having pain in her left flank and its radiating into her left abdomen into her left lower quadrant.  She has not followed up with a urologist yet.  She does admit to a lot of caffeine ingestion specifically coffee.  PCP none  Past Medical History:  Diagnosis Date  . Anxiety   . Bipolar affective disorder, depressed, mild ([place])   . Chlamydia   . Depression   . Dermoid cyst   . Dermoid cyst of ovary 08/2011   2.5 cm on right ovary  . Gonorrhea   . PCOS (polycystic ovarian syndrome)     Patient Active Problem List   Diagnosis Date Noted  . Vaginal lesion 01/30/2015  . Menorrhagia with irregular cycle 12/19/2014  . PCOS (polycystic ovarian syndrome) 08/21/2013  . Dermoid cyst 07/06/2012    Past Surgical History:  Procedure Laterality Date  . HYSTEROSCOPY W/D&C N/A 01/30/2015   Procedure: DILATATION AND CURETTAGE /HYSTEROSCOPY;  Surgeon: Donnamae Jude, MD;  Location: [facility];  Service: Gynecology;  Laterality: N/A;  . LAPAROSCOPY  07/27/2012   Procedure: LAPAROSCOPY OPERATIVE;  Surgeon:  Mora Bellman, MD;  Location: [facility];  Service: Gynecology;  Laterality: N/A;  . OVARIAN CYST REMOVAL  07/27/2012   Procedure: OVARIAN CYSTECTOMY;  Surgeon: Mora Bellman, MD;  Location: [facility];  Service: Gynecology;  Laterality: Right;  Dermoid Cyst    OB History    Gravida Para Term Preterm AB Living   0       0 0   SAB TAB Ectopic Multiple Live Births   0               Home Medications    Prior to Admission medications   Medication Sig Start Date End Date Taking? Authorizing Provider  amoxicillin (AMOXIL) 500 MG capsule Take 2 capsules (1,000 mg total) by mouth 2 (two) times daily. Patient not taking: Reported on 08/30/2017 07/15/17   Janne Napoleon, NP  cyclobenzaprine (FLEXERIL) 5 MG tablet Take 1 tablet (5 mg total) by mouth 3 (three) times daily as needed. 09/03/17   Rolland Porter, MD  HYDROcodone-acetaminophen (NORCO/VICODIN) 5-325 MG tablet Take 1 tablet by mouth every 6 (six) hours as needed for moderate pain. 08/30/17   Milton Ferguson, MD  ibuprofen (ADVIL,MOTRIN) 800 MG tablet Take 1 tablet (800 mg total) by mouth 3 (three) times daily. Patient not taking: Reported on 08/30/2017 05/17/17   Ezequiel Essex, MD  naproxen (NAPROSYN) 500 MG  tablet Take 1 po BID with food prn pain 09/03/17   Rolland Porter, MD  ondansetron (ZOFRAN ODT) 4 MG disintegrating tablet 4mg  ODT q4 hours prn nausea/vomit 08/30/17   Milton Ferguson, MD  oxyCODONE-acetaminophen (PERCOCET/ROXICET) 5-325 MG tablet Take 1 tablet by mouth every 6 (six) hours as needed for severe pain. 09/03/17   Rolland Porter, MD  phenazopyridine (AZO-STANDARD) 95 MG tablet Take 95 mg by mouth 3 (three) times daily as needed for pain.    [provider]  tamsulosin (FLOMAX) 0.4 MG CAPS capsule Take 1 po QD until you pass the stone. 09/03/17   Rolland Porter, MD    Family History Family History  Problem Relation Age of Onset  . Diabetes Maternal Grandmother   . Arthritis Maternal Grandmother   . Arthritis Maternal Grandfather   .  Diabetes Paternal Grandmother   . Hypertension Paternal Grandmother   . Arthritis Paternal Grandmother   . Kidney disease Paternal Grandmother   . Arthritis Paternal Grandfather     Social History Social History  Substance Use Topics  . Smoking status: Current Every Day Smoker    Packs/day: 0.50    Years: 6.00    Types: Cigarettes  . Smokeless tobacco: Never Used  . Alcohol use 0.5 oz/week    1 Standard drinks or equivalent per week     Comment: occasionally  employed   Allergies   Patient has no known allergies.   Review of Systems Review of Systems  All other systems reviewed and are negative.    Physical Exam Updated Vital Signs BP 110/69 (BP Location: Right Arm)   Pulse 68   Temp 98.2 F (36.8 C) (Oral)   Resp 16   LMP  (LMP Unknown)   SpO2 100%   Vital signs normal ]   Physical Exam  Constitutional: She is oriented to person, place, and time. She appears well-developed and well-nourished. She appears distressed.  HENT:  Head: Normocephalic and atraumatic.  Right Ear: External ear normal.  Left Ear: External ear normal.  Eyes: Pupils are equal, round, and reactive to light. Conjunctivae and EOM are normal.  Neck: Normal range of motion. Neck supple.  Cardiovascular: Normal rate.   Pulmonary/Chest: Effort normal. No respiratory distress.  Abdominal: There is tenderness.  + left flank pain, patient appears uncomfortable and is rolling around on the stretcher holding her left flank and abdomen.  Musculoskeletal: Normal range of motion. She exhibits no deformity.  Neurological: She is alert and oriented to person, place, and time. No cranial nerve deficit.  Skin: Skin is warm and dry.  Psychiatric: She has a normal mood and affect. Her behavior is normal. Thought content normal.  Nursing note and vitals reviewed.    ED Treatments / Results  Labs (all labs ordered are listed, but only abnormal results are displayed) Results for orders placed or  performed during the hospital encounter of 09/03/17  Urinalysis, Routine w reflex microscopic  Result Value Ref Range   Color, Urine YELLOW YELLOW   APPearance HAZY (A) CLEAR   Specific Gravity, Urine 1.029 1.005 - 1.030   pH 6.0 5.0 - 8.0   Glucose, UA NEGATIVE NEGATIVE mg/dL   Hgb urine dipstick NEGATIVE NEGATIVE   Bilirubin Urine NEGATIVE NEGATIVE   Ketones, ur NEGATIVE NEGATIVE mg/dL   Protein, ur NEGATIVE NEGATIVE mg/dL   Nitrite NEGATIVE NEGATIVE   Leukocytes, UA NEGATIVE NEGATIVE  POC urine preg, ED (not at [facility])  Result Value Ref Range   Preg Test, Ur NEGATIVE  NEGATIVE    Laboratory interpretation all normal    EKG  EKG Interpretation None       Radiology No results found.    Ct Renal Stone Study  Result Date: 08/30/2017 CLINICAL DATA:  Left flank pain and left lower abdominal pain and nausea. abdominopelvic ascites. Musculoskeletal: No acute or significant osseous findings. IMPRESSION: 1 mm stone obstructing the distal left ureter just proximal to the bladder. Mild left hydronephrosis. Complete resolution of right hydronephrosis since the prior study. Electronically Signed   By: Lorriane Shire M.D.   On: 08/30/2017 11:05   Procedures Procedures (including critical care time)  Medications Ordered in ED Medications  ketorolac (TORADOL) injection 60 mg (60 mg Intramuscular Given 09/03/17 0634)  ondansetron (ZOFRAN-ODT) disintegrating tablet 8 mg (8 mg Oral Given 09/03/17 3329)  cyclobenzaprine (FLEXERIL) tablet 5 mg (5 mg Oral Given 09/03/17 0754)  fentaNYL (SUBLIMAZE) injection 50 mcg (50 mcg Intramuscular Given 09/03/17 0752)     Initial Impression / Assessment and Plan / ED Course  I have reviewed the triage vital signs and the nursing notes.  Pertinent labs & imaging results that were available during my care of the patient were reviewed by me and considered in my medical decision making (see chart for details).     Patient was given Toradol IM and  Zofran ODT.  Recheck at 6:45 AM patient states the medications have just taken the edge off, however I just realized she just had gotten the medication.  Recheck at 7:30 AM patient states that she still having pain although it is improved.  She states she does need something else.  She was given fentanyl IM.  She states it hurts when she moves.  However her pain is described in the same location as she had before and in the expected course of passing a kidney stone.  8:30 AM patient states her pain is better.  She states she has only 1 more pill of the Flomax left, she states that hydrocodone is not helping her pain.  She was given 10 Percocet and more Flomax.  Final Clinical Impressions(s) / ED Diagnoses   Final diagnoses:  Ureteral calculus, left    New Prescriptions New Prescriptions   CYCLOBENZAPRINE (FLEXERIL) 5 MG TABLET    Take 1 tablet (5 mg total) by mouth 3 (three) times daily as needed.   NAPROXEN (NAPROSYN) 500 MG TABLET    Take 1 po BID with food prn pain   OXYCODONE-ACETAMINOPHEN (PERCOCET/ROXICET) 5-325 MG TABLET    Take 1 tablet by mouth every 6 (six) hours as needed for severe pain.   TAMSULOSIN (FLOMAX) 0.4 MG CAPS CAPSULE    Take 1 po QD until you pass the stone.    Plan discharge  Rolland Porter, MD, Barbette Or, MD 09/03/17 [phone]

## 2017-10-09 ENCOUNTER — Encounter (HOSPITAL_COMMUNITY): Payer: Self-pay | Admitting: *Deleted

## 2017-10-09 ENCOUNTER — Other Ambulatory Visit: Payer: Self-pay

## 2017-10-09 ENCOUNTER — Encounter (HOSPITAL_COMMUNITY): Payer: Self-pay | Admitting: Nurse Practitioner

## 2017-10-09 ENCOUNTER — Emergency Department (HOSPITAL_COMMUNITY): Payer: Self-pay

## 2017-10-09 ENCOUNTER — Emergency Department (HOSPITAL_COMMUNITY)
Admission: EM | Admit: 2017-10-09 | Discharge: 2017-10-09 | Disposition: A | Discharge status: Home / Self Care | Payer: Self-pay | Attending: Emergency Medicine | Admitting: Emergency Medicine

## 2017-10-09 ENCOUNTER — Emergency Department (HOSPITAL_COMMUNITY)
Admission: EM | Admit: 2017-10-09 | Discharge: 2017-10-09 | Disposition: A | Discharge status: Home / Self Care | Payer: No Typology Code available for payment source | Attending: Emergency Medicine | Admitting: Emergency Medicine

## 2017-10-09 DIAGNOSIS — R58 Hemorrhage, not elsewhere classified: Secondary | ICD-10-CM | POA: Insufficient documentation

## 2017-10-09 DIAGNOSIS — Z79899 Other long term (current) drug therapy: Secondary | ICD-10-CM | POA: Insufficient documentation

## 2017-10-09 DIAGNOSIS — F17210 Nicotine dependence, cigarettes, uncomplicated: Secondary | ICD-10-CM | POA: Insufficient documentation

## 2017-10-09 DIAGNOSIS — T310 Burns involving less than 10% of body surface: Secondary | ICD-10-CM | POA: Insufficient documentation

## 2017-10-09 DIAGNOSIS — S39012A Strain of muscle, fascia and tendon of lower back, initial encounter: Secondary | ICD-10-CM | POA: Insufficient documentation

## 2017-10-09 DIAGNOSIS — R51 Headache: Secondary | ICD-10-CM | POA: Insufficient documentation

## 2017-10-09 DIAGNOSIS — Y929 Unspecified place or not applicable: Secondary | ICD-10-CM | POA: Insufficient documentation

## 2017-10-09 DIAGNOSIS — Z5321 Procedure and treatment not carried out due to patient leaving prior to being seen by health care provider: Secondary | ICD-10-CM | POA: Insufficient documentation

## 2017-10-09 DIAGNOSIS — S161XXA Strain of muscle, fascia and tendon at neck level, initial encounter: Secondary | ICD-10-CM | POA: Insufficient documentation

## 2017-10-09 DIAGNOSIS — Y999 Unspecified external cause status: Secondary | ICD-10-CM | POA: Insufficient documentation

## 2017-10-09 DIAGNOSIS — T07XXXA Unspecified multiple injuries, initial encounter: Secondary | ICD-10-CM

## 2017-10-09 DIAGNOSIS — Y939 Activity, unspecified: Secondary | ICD-10-CM | POA: Insufficient documentation

## 2017-10-09 LAB — POC URINE PREG, ED: PREG TEST UR: NEGATIVE

## 2017-10-09 MED ORDER — TRIPLE ANTIBIOTIC 5-400-5000 EX OINT: TOPICAL_OINTMENT | Freq: Four times a day (QID) | CUTANEOUS | 1 refills | Status: DC

## 2017-10-09 MED ORDER — NAPROXEN 500 MG PO TABS: 500.0000 mg | ORAL_TABLET | Freq: Two times a day (BID) | ORAL | 0 refills | Status: DC

## 2017-10-09 MED ORDER — CYCLOBENZAPRINE HCL 10 MG PO TABS: 10.0000 mg | ORAL_TABLET | Freq: Two times a day (BID) | ORAL | 0 refills | Status: DC | PRN

## 2017-10-09 NOTE — ED Notes (Signed)
Pt turned in her stickers and left without being seen

## 2017-10-09 NOTE — ED Notes (Addendum)
Pt transported to xray/CT
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
## 2017-10-09 NOTE — ED Notes (Signed)
EDP at bedside  
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
## 2018-01-17 ENCOUNTER — Encounter (HOSPITAL_COMMUNITY): Payer: Self-pay | Admitting: *Deleted

## 2018-01-17 ENCOUNTER — Other Ambulatory Visit: Payer: Self-pay

## 2018-01-17 ENCOUNTER — Emergency Department (HOSPITAL_COMMUNITY)
Admission: EM | Admit: 2018-01-17 | Discharge: 2018-01-18 | Disposition: A | Discharge status: Home / Self Care | Payer: No Typology Code available for payment source | Attending: Emergency Medicine | Admitting: Emergency Medicine

## 2018-01-17 DIAGNOSIS — Z79899 Other long term (current) drug therapy: Secondary | ICD-10-CM | POA: Insufficient documentation

## 2018-01-17 DIAGNOSIS — K0889 Other specified disorders of teeth and supporting structures: Secondary | ICD-10-CM | POA: Insufficient documentation

## 2018-01-17 DIAGNOSIS — F17210 Nicotine dependence, cigarettes, uncomplicated: Secondary | ICD-10-CM | POA: Insufficient documentation

## 2018-01-17 NOTE — ED Triage Notes (Signed)
Pt c/o pain to teeth on both sides pt is c/o some swelling and pain to right upper teeth and left lower teeth

## 2018-01-18 MED ORDER — OXYCODONE-ACETAMINOPHEN 5-325 MG PO TABS
1.0000 | ORAL_TABLET | Freq: Once | ORAL | Status: AC
  Administered 2018-01-18: 1 via ORAL
  Filled 2018-01-18: qty 1

## 2018-01-18 MED ORDER — AMOXICILLIN 500 MG PO CAPS: 500.0000 mg | ORAL_CAPSULE | Freq: Three times a day (TID) | ORAL | 0 refills | Status: DC

## 2018-01-18 MED ORDER — NAPROXEN 375 MG PO TABS: 375.0000 mg | ORAL_TABLET | Freq: Two times a day (BID) | ORAL | 0 refills | Status: DC

## 2018-01-18 NOTE — ED Provider Notes (Signed)
[facility] Provider Note   CSN: [id number] Arrival date & time: 01/17/18  2310     History   Chief Complaint Chief Complaint  Patient presents with  . Dental Pain    HPI Amber Ray is a 27 y.o. female.  Patient with dental pain involving the wisdom teeth of the left lower and right upper jaw. Pain intensified today after consuming a cold beverage. No fevers/chills. No difficulty swallowing.   The history is provided by the patient. No language interpreter was used.  Dental Pain   This is a recurrent problem. The current episode started 12 to 24 hours ago. The problem occurs constantly. The problem has been gradually worsening. The pain is moderate.    Past Medical History:  Diagnosis Date  . Anxiety   . Bipolar affective disorder, depressed, mild ([place])   . Chlamydia   . Depression   . Dermoid cyst   . Dermoid cyst of ovary 08/2011   2.5 cm on right ovary  . Gonorrhea   . PCOS (polycystic ovarian syndrome)     Patient Active Problem List   Diagnosis Date Noted  . Vaginal lesion 01/30/2015  . Menorrhagia with irregular cycle 12/19/2014  . PCOS (polycystic ovarian syndrome) 08/21/2013  . Dermoid cyst 07/06/2012    Past Surgical History:  Procedure Laterality Date  . HYSTEROSCOPY W/D&C N/A 01/30/2015   Procedure: DILATATION AND CURETTAGE /HYSTEROSCOPY;  Surgeon: Donnamae Jude, MD;  Location: [facility];  Service: Gynecology;  Laterality: N/A;  . LAPAROSCOPY  07/27/2012   Procedure: LAPAROSCOPY OPERATIVE;  Surgeon: Mora Bellman, MD;  Location: [facility];  Service: Gynecology;  Laterality: N/A;  . OVARIAN CYST REMOVAL  07/27/2012   Procedure: OVARIAN CYSTECTOMY;  Surgeon: Mora Bellman, MD;  Location: [facility];  Service: Gynecology;  Laterality: Right;  Dermoid Cyst    OB History    Gravida Para Term Preterm AB Living   0       0 0   SAB TAB Ectopic Multiple Live Births   0               Home Medications    Prior to Admission medications    Medication Sig Start Date End Date Taking? Authorizing Provider  cyclobenzaprine (FLEXERIL) 10 MG tablet Take 1 tablet (10 mg total) by mouth 2 (two) times daily as needed for muscle spasms. 10/09/17   Fredia Sorrow, MD  naproxen (NAPROSYN) 500 MG tablet Take 1 tablet (500 mg total) by mouth 2 (two) times daily. 10/09/17   Fredia Sorrow, MD  neomycin-bacitracin-polymyxin (NEOSPORIN) [phone] ointment Apply topically 4 (four) times daily. 10/09/17   Fredia Sorrow, MD    Family History Family History  Problem Relation Age of Onset  . Diabetes Maternal Grandmother   . Arthritis Maternal Grandmother   . Arthritis Maternal Grandfather   . Diabetes Paternal Grandmother   . Hypertension Paternal Grandmother   . Arthritis Paternal Grandmother   . Kidney disease Paternal Grandmother   . Arthritis Paternal Grandfather     Social History Social History   Tobacco Use  . Smoking status: Current Every Day Smoker    Packs/day: 0.50    Years: 6.00    Pack years: 3.00    Types: Cigarettes  . Smokeless tobacco: Never Used  Substance Use Topics  . Alcohol use: Yes    Alcohol/week: 0.5 oz    Types: 1 Standard drinks or equivalent per week    Comment: occasionally  . Drug use: No  Allergies   Patient has no known allergies.   Review of Systems Review of Systems  Constitutional: Negative for chills and fever.  HENT: Positive for facial swelling.   All other systems reviewed and are negative.    Physical Exam Updated Vital Signs BP 117/80 (BP Location: Right Arm)   Pulse 80   Temp 98.4 F (36.9 C) (Oral)   Resp 18   Ht 5\' 4"  (1.626 m)   Wt 83.9 kg (185 lb)   LMP 12/13/2017   SpO2 100%   BMI 31.76 kg/m   Physical Exam  Constitutional: She is oriented to person, place, and time. She appears well-developed and well-nourished.  HENT:  Mouth/Throat: Mucous membranes are normal. No trismus in the jaw. Dental caries present. No dental abscesses.    Eyes:  Conjunctivae are normal.  Neck: Neck supple.  Cardiovascular: Normal rate and regular rhythm.  Pulmonary/Chest: Effort normal and breath sounds normal.  Abdominal: Soft. Bowel sounds are normal.  Musculoskeletal: Normal range of motion.  Lymphadenopathy:    She has no cervical adenopathy.  Neurological: She is alert and oriented to person, place, and time.  Skin: Skin is warm and dry.  Psychiatric: She has a normal mood and affect.  Nursing note and vitals reviewed.    ED Treatments / Results  Labs (all labs ordered are listed, but only abnormal results are displayed) Labs Reviewed - No data to display  EKG  EKG Interpretation None       Radiology No results found.  Procedures Procedures (including critical care time)  Medications Ordered in ED Medications  oxyCODONE-acetaminophen (PERCOCET/ROXICET) 5-325 MG per tablet 1 tablet (not administered)     Initial Impression / Assessment and Plan / ED Course  I have reviewed the triage vital signs and the nursing notes.  Pertinent labs & imaging results that were available during my care of the patient were reviewed by me and considered in my medical decision making (see chart for details).     Patient with dentalgia.  No abscess requiring immediate incision and drainage.  Exam not concerning for Ludwig's angina or pharyngeal abscess.  Will treat with amoxicillin and naprosyn. Pt instructed to follow-up with dentist.  Discussed return precautions. Pt safe for discharge.  Final Clinical Impressions(s) / ED Diagnoses   Final diagnoses:  Tooth pain    ED Discharge Orders        Ordered    amoxicillin (AMOXIL) 500 MG capsule  3 times daily     01/18/18 0009    naproxen (NAPROSYN) 375 MG tablet  2 times daily     01/18/18 0009       Etta Quill, NP 01/18/18 0018    Orpah Greek, MD 01/18/18 [phone]

## 2018-01-18 NOTE — ED Notes (Signed)
Pt ambulatory to waiting room. Pt verbalized understanding of discharge instructions.   

## 2018-02-28 ENCOUNTER — Emergency Department (HOSPITAL_COMMUNITY)
Admission: EM | Admit: 2018-02-28 | Discharge: 2018-02-28 | Payer: Managed Care, Other (non HMO) | Attending: Emergency Medicine | Admitting: Emergency Medicine

## 2018-02-28 ENCOUNTER — Other Ambulatory Visit: Payer: Self-pay

## 2018-02-28 ENCOUNTER — Emergency Department (HOSPITAL_COMMUNITY): Payer: Managed Care, Other (non HMO)

## 2018-02-28 ENCOUNTER — Encounter (HOSPITAL_COMMUNITY): Payer: Self-pay | Admitting: *Deleted

## 2018-02-28 DIAGNOSIS — R109 Unspecified abdominal pain: Secondary | ICD-10-CM | POA: Diagnosis not present

## 2018-02-28 DIAGNOSIS — Y92410 Unspecified street and highway as the place of occurrence of the external cause: Secondary | ICD-10-CM | POA: Insufficient documentation

## 2018-02-28 DIAGNOSIS — M79641 Pain in right hand: Secondary | ICD-10-CM | POA: Diagnosis not present

## 2018-02-28 DIAGNOSIS — F17210 Nicotine dependence, cigarettes, uncomplicated: Secondary | ICD-10-CM | POA: Insufficient documentation

## 2018-02-28 DIAGNOSIS — Y9389 Activity, other specified: Secondary | ICD-10-CM | POA: Diagnosis not present

## 2018-02-28 DIAGNOSIS — Y998 Other external cause status: Secondary | ICD-10-CM | POA: Diagnosis not present

## 2018-02-28 DIAGNOSIS — T07XXXA Unspecified multiple injuries, initial encounter: Secondary | ICD-10-CM | POA: Diagnosis not present

## 2018-02-28 DIAGNOSIS — M25571 Pain in right ankle and joints of right foot: Secondary | ICD-10-CM | POA: Insufficient documentation

## 2018-02-28 LAB — URINALYSIS, ROUTINE W REFLEX MICROSCOPIC
BILIRUBIN URINE: NEGATIVE
GLUCOSE, UA: NEGATIVE mg/dL
HGB URINE DIPSTICK: NEGATIVE
Ketones, ur: NEGATIVE mg/dL
Leukocytes, UA: NEGATIVE
Nitrite: NEGATIVE
Protein, ur: NEGATIVE mg/dL
SPECIFIC GRAVITY, URINE: 1.011 (ref 1.005–1.030)
pH: 6 (ref 5.0–8.0)

## 2018-02-28 LAB — I-STAT BETA HCG BLOOD, ED (MC, WL, AP ONLY): I-stat hCG, quantitative: <5 m[IU]/mL (ref <5)

## 2018-02-28 LAB — RAPID URINE DRUG SCREEN, HOSP PERFORMED
AMPHETAMINES: NOT DETECTED
Barbiturates: NOT DETECTED
Benzodiazepines: NOT DETECTED
Cocaine: NOT DETECTED
Opiates: POSITIVE — AB
TETRAHYDROCANNABINOL: POSITIVE — AB

## 2018-02-28 MED ORDER — IOPAMIDOL (ISOVUE-300) INJECTION 61%
100.0000 mL | Freq: Once | INTRAVENOUS | Status: AC | PRN
  Administered 2018-02-28: 100 mL via INTRAVENOUS

## 2018-02-28 NOTE — Discharge Instructions (Addendum)
Your testing is negative for serious injury.  Wear the splint on your finger and follow-up with Dr. [facility] Brochure.  Return to the ED if you develop new or worsening symptoms.
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
## 2018-02-28 NOTE — ED Notes (Signed)
Patient transported to CT 
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
## 2018-03-02 ENCOUNTER — Ambulatory Visit (HOSPITAL_COMMUNITY)
Admission: EM | Admit: 2018-03-02 | Discharge: 2018-03-02 | Disposition: A | Discharge status: Home / Self Care | Payer: Managed Care, Other (non HMO) | Attending: Family Medicine | Admitting: Family Medicine

## 2018-03-02 ENCOUNTER — Encounter (HOSPITAL_COMMUNITY): Payer: Self-pay | Admitting: Emergency Medicine

## 2018-03-02 DIAGNOSIS — M79641 Pain in right hand: Secondary | ICD-10-CM

## 2018-03-02 MED ORDER — DICLOFENAC SODIUM 75 MG PO TBEC: 75.0000 mg | DELAYED_RELEASE_TABLET | Freq: Two times a day (BID) | ORAL | 0 refills | Status: DC
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
## 2018-03-02 NOTE — ED Notes (Signed)
Work note per dr Joseph Art.  "return to work on Monday"
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
## 2018-07-12 ENCOUNTER — Ambulatory Visit (HOSPITAL_COMMUNITY)
Admission: EM | Admit: 2018-07-12 | Discharge: 2018-07-12 | Disposition: A | Discharge status: Home / Self Care | Payer: Managed Care, Other (non HMO) | Attending: Family Medicine | Admitting: Family Medicine

## 2018-07-12 ENCOUNTER — Encounter (HOSPITAL_COMMUNITY): Payer: Self-pay | Admitting: Emergency Medicine

## 2018-07-12 DIAGNOSIS — R42 Dizziness and giddiness: Secondary | ICD-10-CM

## 2018-07-12 MED ORDER — MECLIZINE HCL 25 MG PO TABS: 25.0000 mg | ORAL_TABLET | Freq: Three times a day (TID) | ORAL | 0 refills | Status: DC | PRN

## 2018-07-12 MED ORDER — FLUTICASONE PROPIONATE 50 MCG/ACT NA SUSP: 2.0000 | Freq: Every day | NASAL | 0 refills | Status: DC

## 2018-07-12 NOTE — Discharge Instructions (Addendum)
No alarming signs on exam. Symptoms are more consistent with vertigo. Start meclizine as directed. Flonase as directed to cover for possible eustachian tube dysfunction. Keep hydrated, your urine should be clear to pale yellow in color. Follow up with ENT/PCP if symptoms not improving. If experiencing worsening symptoms, weakness, passing out, confusion, go to the emergency department for further evaluation.

## 2018-07-12 NOTE — ED Triage Notes (Signed)
Pt sts dizziness with room spinning and worse with position change x 1 week

## 2018-07-12 NOTE — ED Provider Notes (Signed)
[place]    CSN: [id number] Arrival date & time: 07/12/18  1133     History   Chief Complaint Chief Complaint  Patient presents with  . Dizziness    HPI Amber Ray is a 27 y.o. female.   27 year old female comes in for 1 week history of dizziness.  States at first was intermittent and mild, only when changing from sitting to standing.  Now also with dizziness with head movement.  She feels as if the room is spinning with these movement.  States laying down makes her feel better.  She has intermittent nausea during these episodes, without vomiting. Denies abdominal pain. Denies chest pain, shortness of breath, wheezing, palpitations. Denies fever, chills, night sweats.  Denies URI symptoms such as cough, congestion, sore throat.  Denies urinary symptoms such as frequency, dysuria, hematuria.  LMP 06/24/2018, and denies menorrhagia.  States she has had history of hypoglycemia, and has strips at home, has checked while having dizziness without hypoglycemia.     Past Medical History:  Diagnosis Date  . Anxiety   . Bipolar affective disorder, depressed, mild ([place])   . Chlamydia   . Depression   . Dermoid cyst   . Dermoid cyst of ovary 08/2011   2.5 cm on right ovary  . Gonorrhea   . PCOS (polycystic ovarian syndrome)     Patient Active Problem List   Diagnosis Date Noted  . Vaginal lesion 01/30/2015  . Menorrhagia with irregular cycle 12/19/2014  . PCOS (polycystic ovarian syndrome) 08/21/2013  . Dermoid cyst 07/06/2012    Past Surgical History:  Procedure Laterality Date  . HYSTEROSCOPY W/D&C N/A 01/30/2015   Procedure: DILATATION AND CURETTAGE /HYSTEROSCOPY;  Surgeon: Donnamae Jude, MD;  Location: [facility];  Service: Gynecology;  Laterality: N/A;  . LAPAROSCOPY  07/27/2012   Procedure: LAPAROSCOPY OPERATIVE;  Surgeon: Mora Bellman, MD;  Location: [facility];  Service: Gynecology;  Laterality: N/A;  . OVARIAN CYST REMOVAL  07/27/2012   Procedure: OVARIAN  CYSTECTOMY;  Surgeon: Mora Bellman, MD;  Location: [facility];  Service: Gynecology;  Laterality: Right;  Dermoid Cyst    OB History    Gravida  0   Para      Term      Preterm      AB  0   Living  0     SAB  0   TAB      Ectopic      Multiple      Live Births               Home Medications    Prior to Admission medications   Medication Sig Start Date End Date Taking? Authorizing Provider  diclofenac (VOLTAREN) 75 MG EC tablet Take 1 tablet (75 mg total) by mouth 2 (two) times daily. Patient not taking: Reported on 07/12/2018 03/02/18   Robyn Haber, MD  fluticasone [facility]) 50 MCG/ACT nasal spray Place 2 sprays into both nostrils daily. 07/12/18   Ok Edwards, PA-C  meclizine (ANTIVERT) 25 MG tablet Take 1 tablet (25 mg total) by mouth 3 (three) times daily as needed for dizziness. 07/12/18   Ok Edwards, PA-C    Family History Family History  Problem Relation Age of Onset  . Diabetes Maternal Grandmother   . Arthritis Maternal Grandmother   . Arthritis Maternal Grandfather   . Diabetes Paternal Grandmother   . Hypertension Paternal Grandmother   . Arthritis Paternal Grandmother   . Kidney disease Paternal  Grandmother   . Arthritis Paternal Grandfather     Social History Social History   Tobacco Use  . Smoking status: Current Every Day Smoker    Packs/day: 0.50    Years: 6.00    Pack years: 3.00    Types: Cigarettes  . Smokeless tobacco: Never Used  Substance Use Topics  . Alcohol use: Yes    Alcohol/week: 1.0 standard drinks    Types: 1 Standard drinks or equivalent per week    Comment: occasionally  . Drug use: No     Allergies   Patient has no known allergies.   Review of Systems Review of Systems   Physical Exam Triage Vital Signs ED Triage Vitals [07/12/18 1215]  Enc Vitals Group     BP 113/81     Pulse Rate 66     Resp 18     Temp 98.1 F (36.7 C)     Temp Source Oral     SpO2 100 %     Weight      Height      Head  Circumference      Peak Flow      Pain Score      Pain Loc      Pain Edu?      Excl. in [place]?    Orthostatic VS for the past 24 hrs:  BP- Lying Pulse- Lying BP- Sitting Pulse- Sitting BP- Standing at 0 minutes Pulse- Standing at 0 minutes  07/12/18 1318 115/84 62 112/78 65 115/77 67    Updated Vital Signs BP 113/81 (BP Location: Right Arm)   Pulse 66   Temp 98.1 F (36.7 C) (Oral)   Resp 18   SpO2 100%   Physical Exam  Constitutional: She is oriented to person, place, and time. She appears well-developed and well-nourished. No distress.  HENT:  Head: Normocephalic and atraumatic.  Right Ear: External ear and ear canal normal. Tympanic membrane is erythematous. Tympanic membrane is not bulging.  Left Ear: External ear and ear canal normal. Tympanic membrane is erythematous. Tympanic membrane is not bulging.  Nose: Nose normal. Right sinus exhibits no maxillary sinus tenderness and no frontal sinus tenderness. Left sinus exhibits no maxillary sinus tenderness and no frontal sinus tenderness.  Mouth/Throat: Uvula is midline, oropharynx is clear and moist and mucous membranes are normal.  Eyes: Pupils are equal, round, and reactive to light. Conjunctivae are normal.  Full EOM.   Neck: Normal range of motion. Neck supple.  Cardiovascular: Normal rate, regular rhythm and normal heart sounds. Exam reveals no gallop and no friction rub.  No murmur heard. Pulmonary/Chest: Effort normal and breath sounds normal. No accessory muscle usage or stridor. No respiratory distress. She has no decreased breath sounds. She has no wheezes. She has no rhonchi. She has no rales.  Lymphadenopathy:    She has no cervical adenopathy.  Neurological: She is alert and oriented to person, place, and time. She has normal strength. She is not disoriented. No cranial nerve deficit or sensory deficit. She displays a negative Romberg sign. Coordination and gait normal. GCS eye subscore is 4. GCS verbal subscore is 5.  GCS motor subscore is 6.  Normal finger to nose, rapid movement.   Skin: Skin is warm and dry. She is not diaphoretic.  Psychiatric: She has a normal mood and affect. Her behavior is normal. Judgment normal.     UC Treatments / Results  Labs (all labs ordered are listed, but only abnormal results are displayed)  Labs Reviewed - No data to display  EKG None  Radiology No results found.  Procedures Procedures (including critical care time)  Medications Ordered in UC Medications - No data to display  Initial Impression / Assessment and Plan / UC Course  I have reviewed the triage vital signs and the nursing notes.  Pertinent labs & imaging results that were available during my care of the patient were reviewed by me and considered in my medical decision making (see chart for details).    No alarming signs on exam.  Neurology exam grossly intact.  Orthostatic negative.  History and exam more consistent with vertigo, will have patient start meclizine.  Flonase for possible eustachian tube dysfunction.  Push fluids.  Return precautions given.  Otherwise follow-up with ENT/PCP if symptoms not improving.  Patient expresses understanding and agrees to plan.  Final Clinical Impressions(s) / UC Diagnoses   Final diagnoses:  Vertigo    ED Prescriptions    Medication Sig Dispense Auth. Provider   meclizine (ANTIVERT) 25 MG tablet Take 1 tablet (25 mg total) by mouth 3 (three) times daily as needed for dizziness. 30 tablet Annastasia Haskins V, PA-C   fluticasone (FLONASE) 50 MCG/ACT nasal spray Place 2 sprays into both nostrils daily. 1 g Tobin Chad, [place] 07/12/18 1359

## 2018-09-19 ENCOUNTER — Ambulatory Visit: Payer: Self-pay | Admitting: Family Medicine

## 2019-11-16 ENCOUNTER — Ambulatory Visit: Payer: HRSA Program | Attending: Internal Medicine

## 2019-11-16 ENCOUNTER — Other Ambulatory Visit: Payer: Self-pay

## 2019-11-16 DIAGNOSIS — Z20822 Contact with and (suspected) exposure to covid-19: Secondary | ICD-10-CM | POA: Diagnosis not present

## 2019-11-17 ENCOUNTER — Telehealth: Payer: Self-pay | Admitting: *Deleted

## 2019-11-17 LAB — NOVEL CORONAVIRUS, NAA: SARS-CoV-2, NAA: NOT DETECTED

## 2019-11-17 NOTE — Telephone Encounter (Signed)
Pt called for result of COVID test on 11/16/19; explained the turn around time is based on the number of tests to be completed; also explained result is available first in MyChart; pt informed she will receive call regarding result; pt encouraged to answer all calls; she verbalized understanding.

## 2019-12-16 ENCOUNTER — Other Ambulatory Visit: Payer: Self-pay

## 2019-12-16 ENCOUNTER — Encounter (HOSPITAL_COMMUNITY): Payer: Self-pay

## 2019-12-16 ENCOUNTER — Ambulatory Visit (HOSPITAL_COMMUNITY)
Admission: EM | Admit: 2019-12-16 | Discharge: 2019-12-16 | Disposition: A | Discharge status: Home / Self Care | Payer: Self-pay | Attending: Emergency Medicine | Admitting: Emergency Medicine

## 2019-12-16 DIAGNOSIS — Z3202 Encounter for pregnancy test, result negative: Secondary | ICD-10-CM

## 2019-12-16 DIAGNOSIS — R42 Dizziness and giddiness: Secondary | ICD-10-CM

## 2019-12-16 DIAGNOSIS — G43009 Migraine without aura, not intractable, without status migrainosus: Secondary | ICD-10-CM

## 2019-12-16 LAB — POCT PREGNANCY, URINE: Preg Test, Ur: NEGATIVE

## 2019-12-16 LAB — POC URINE PREG, ED: Preg Test, Ur: NEGATIVE

## 2019-12-16 MED ORDER — DEXAMETHASONE SODIUM PHOSPHATE 10 MG/ML IJ SOLN
10.0000 mg | Freq: Once | INTRAMUSCULAR | Status: AC
  Administered 2019-12-16: 10 mg via INTRAMUSCULAR

## 2019-12-16 MED ORDER — METOCLOPRAMIDE HCL 5 MG/ML IJ SOLN
10.0000 mg | Freq: Once | INTRAMUSCULAR | Status: AC
  Administered 2019-12-16: 10 mg via INTRAMUSCULAR

## 2019-12-16 MED ORDER — ONDANSETRON 4 MG PO TBDP
ORAL_TABLET | ORAL | Status: AC
Start: 2019-12-16 — End: ?
  Filled 2019-12-16: qty 1

## 2019-12-16 MED ORDER — KETOROLAC TROMETHAMINE 30 MG/ML IJ SOLN
30.0000 mg | Freq: Once | INTRAMUSCULAR | Status: AC
  Administered 2019-12-16: 30 mg via INTRAMUSCULAR

## 2019-12-16 MED ORDER — KETOROLAC TROMETHAMINE 30 MG/ML IJ SOLN
INTRAMUSCULAR | Status: AC
  Filled 2019-12-16: qty 1

## 2019-12-16 MED ORDER — DEXAMETHASONE SODIUM PHOSPHATE 10 MG/ML IJ SOLN
INTRAMUSCULAR | Status: AC
  Filled 2019-12-16: qty 1

## 2019-12-16 MED ORDER — DIPHENHYDRAMINE HCL 25 MG PO CAPS
ORAL_CAPSULE | ORAL | Status: AC
  Filled 2019-12-16: qty 2

## 2019-12-16 MED ORDER — DIPHENHYDRAMINE HCL 25 MG PO CAPS
50.0000 mg | ORAL_CAPSULE | Freq: Once | ORAL | Status: AC
Start: 2019-12-16 — End: 2019-12-16
  Administered 2019-12-16: 50 mg via ORAL

## 2019-12-16 MED ORDER — IBUPROFEN 600 MG PO TABS: 600.0000 mg | ORAL_TABLET | Freq: Four times a day (QID) | ORAL | 0 refills | Status: DC | PRN

## 2019-12-16 MED ORDER — ONDANSETRON 8 MG PO TBDP: ORAL_TABLET | ORAL | 0 refills | Status: DC

## 2019-12-16 MED ORDER — MECLIZINE HCL 25 MG PO TABS: 25.0000 mg | ORAL_TABLET | Freq: Three times a day (TID) | ORAL | 0 refills | Status: DC | PRN

## 2019-12-16 MED ORDER — ONDANSETRON 4 MG PO TBDP
4.0000 mg | ORAL_TABLET | Freq: Once | ORAL | Status: AC
  Administered 2019-12-16: 15:00:00 4 mg via ORAL

## 2019-12-16 MED ORDER — METOCLOPRAMIDE HCL 5 MG/ML IJ SOLN
INTRAMUSCULAR | Status: AC
  Filled 2019-12-16: qty 2

## 2019-12-16 NOTE — ED Triage Notes (Signed)
Pt presents with ongoing headache, dizziness, nauseous with episodes of vomiting today since waking up this morning.

## 2019-12-16 NOTE — ED Provider Notes (Signed)
HPI  SUBJECTIVE:  Amber Ray is a 29 y.o. female who reports a throbbing, gradual onset headache located in the center of her forehead starting today.  She states it is achy, constant.  She reports nausea, multiple episodes of nonbilious nonbloody emesis, and dizziness described as vertigo "the room spinning around me" and feeling "off balance".  Vertigo started after she had had the headache.  She states that the vertigo comes and goes in waves.  She reports photophobia.  No fevers, visual changes, arm or leg weakness, facial droop.  No discoordination, slurred speech, ear pain, jaw dental pain.  No purulent nasal drainage, rash, neck stiffness.  No chest pain, shortness of breath, palpitations, syncope, seizures.  She tried 1000 g of Tylenol without improvement in her symptoms.  No NSAIDs today.  Symptoms are better with being in a dark room, worse with lights, noise.  She states that she has had similar headaches like this accompanied with vertigo in the past.  She has been seen here for similar symptoms.  Last time she had a headache for a week which was then followed by the vertigo which was thought to be peripheral vertigo.  Was prescribed meclizine which she states helped.  She has run out of this.  She has a past medical history of migraines, vertigo, PCOS.  No history of diabetes, stroke, aneurysm, hypertension, temporal arteritis, glaucoma.  LMP: Last month.  Unsure if she could be pregnant.  PMD: Health department.   Past Medical History:  Diagnosis Date  . Anxiety   . Bipolar affective disorder, depressed, mild ([place])   . Chlamydia   . Depression   . Dermoid cyst   . Dermoid cyst of ovary 08/2011   2.5 cm on right ovary  . Gonorrhea   . PCOS (polycystic ovarian syndrome)     Past Surgical History:  Procedure Laterality Date  . HYSTEROSCOPY WITH D & C N/A 01/30/2015   Procedure: DILATATION AND CURETTAGE /HYSTEROSCOPY;  Surgeon: Donnamae Jude, MD;  Location: [facility];  Service:  Gynecology;  Laterality: N/A;  . LAPAROSCOPY  07/27/2012   Procedure: LAPAROSCOPY OPERATIVE;  Surgeon: Mora Bellman, MD;  Location: [facility];  Service: Gynecology;  Laterality: N/A;  . OVARIAN CYST REMOVAL  07/27/2012   Procedure: OVARIAN CYSTECTOMY;  Surgeon: Mora Bellman, MD;  Location: [facility];  Service: Gynecology;  Laterality: Right;  Dermoid Cyst    Family History  Problem Relation Age of Onset  . Diabetes Maternal Grandmother   . Arthritis Maternal Grandmother   . Arthritis Maternal Grandfather   . Diabetes Paternal Grandmother   . Hypertension Paternal Grandmother   . Arthritis Paternal Grandmother   . Kidney disease Paternal Grandmother   . Arthritis Paternal Grandfather     Social History   Tobacco Use  . Smoking status: Current Every Day Smoker    Packs/day: 0.50    Years: 6.00    Pack years: 3.00    Types: Cigarettes  . Smokeless tobacco: Never Used  Substance Use Topics  . Alcohol use: Yes    Alcohol/week: 1.0 standard drinks    Types: 1 Standard drinks or equivalent per week    Comment: occasionally  . Drug use: No    No current facility-administered medications for this encounter.  Current Outpatient Medications:  .  fluticasone (FLONASE) 50 MCG/ACT nasal spray, Place 2 sprays into both nostrils daily., Disp: 1 g, Rfl: 0 .  ibuprofen (ADVIL) 600 MG tablet, Take 1 tablet (600 mg total)  by mouth every 6 (six) hours as needed., Disp: 30 tablet, Rfl: 0 .  meclizine (ANTIVERT) 25 MG tablet, Take 1 tablet (25 mg total) by mouth 3 (three) times daily as needed for dizziness., Disp: 30 tablet, Rfl: 0 .  ondansetron (ZOFRAN ODT) 8 MG disintegrating tablet, 1/2- 1 tablet q 8 hr prn nausea, vomiting, Disp: 20 tablet, Rfl: 0  No Known Allergies   ROS  As noted in HPI.   Physical Exam  BP 123/80 (BP Location: Right Arm)   Pulse 93   Temp 97.8 F (36.6 C) (Oral)   Resp 17   SpO2 99%   Constitutional: Well developed, well nourished, appears uncomfortable.   Lying in a darkened room, vomiting Eyes: PERRL, EOMI, conjunctiva normal bilaterally.  bilateral photophobia.  Unable to tolerate funduscopic HENT: Normocephalic, atraumatic,mucus membranes moist, normal dentition.  TM normal b/l. No TMJ tenderness. Normal dentition. No nasal congestion, no sinus tenderness. No temporal artery tenderness.  Neck: no cervical LN - trapezial muscle tenderness. No meningismus Respiratory: normal inspiratory effort Cardiovascular: Normal rate, regular rhythm GI:  nondistended skin: No rash, skin intact Musculoskeletal: No edema, no tenderness, no deformities Neurologic: Alert & oriented x 3, CN III-XII intact, romberg neg, finger-> nose, heel-> shin equal b/l, pt unable to perform Romberg, tandem gait steady Psychiatric: Speech and behavior appropriate   ED Course   Medications  ondansetron (ZOFRAN-ODT) disintegrating tablet 4 mg (4 mg Oral Given 12/16/19 1506)  ketorolac (TORADOL) 30 MG/ML injection 30 mg (30 mg Intramuscular Given 12/16/19 1627)  dexamethasone (DECADRON) injection 10 mg (10 mg Intramuscular Given 12/16/19 1627)  diphenhydrAMINE (BENADRYL) capsule 50 mg (50 mg Oral Given 12/16/19 1626)  metoCLOPramide (REGLAN) injection 10 mg (10 mg Intramuscular Given 12/16/19 1627)    Orders Placed This Encounter  Procedures  . POC urine pregnancy    Standing Status:   Standing    Number of Occurrences:   1  . Pregnancy, urine POC    Standing Status:   Standing    Number of Occurrences:   1   Results for orders placed or performed during the hospital encounter of 12/16/19 (from the past 24 hour(s))  POC urine pregnancy     Status: None   Collection Time: 12/16/19  3:58 PM  Result Value Ref Range   Preg Test, Ur NEGATIVE NEGATIVE  Pregnancy, urine POC     Status: None   Collection Time: 12/16/19  3:58 PM  Result Value Ref Range   Preg Test, Ur NEGATIVE NEGATIVE   No results found.   ED Clinical Impression  1. Migraine without aura and without  status migrainosus, not intractable   2. Vertigo     ED Assessment/Plan  Urine pregnancy negative.  Pt describing typical pain, no sudden onset. Doubt SAH, ICH or space occupying lesion. Pt without fevers/chills, Pt has no meningeal sx, no nuchal rigidity. Doubt meningitis. Pt with normal neuro exam, no evidence of CVA/TIA.  Pt BP not elevated significantly, doubt hypertensive emergency. No evidence of temporal artery tenderness, no evidence of glaucoma or other ocular pathology. Will give headache cocktail (dexamethasone 10 IM  Reglan 10 IM toradol 30 IM, benadryl  50 po ),  and reassess.  If no better will transfer to the ED.  Urine pregnancy negative.  Patient is unable to perform Romberg, however was able to perform tandem gait, nose, heel shin without any problem.  Cerebellar stroke low on the differential.  It appears to be a migraine triggering off vertigo.  She states that she has had headaches like this before accompanied with vertigo.  Patient still vomiting after the Zofran thus was given Reglan.  Pt improving after medications although headache is not completely resolved. We discussed going down to the ED for IV fluids and further management the patient has opted to try to go home. Pt with continued non-focal neuro exam. Will d/c home with nsaid, antiemetic, meclizine, and will refer to [facility] or [facility] given her history of migraines which are now accompanied by vertigo. Work/school note for today and tomorrow she is to go to the ER if not better by tonight, if she continues to vomit, if she gets worse, or for any other concerns. Discussed medical decision-making, treatment plan and plan for follow-up with patient. Pt agrees with plan  Meds ordered this encounter  Medications  . ondansetron (ZOFRAN-ODT) disintegrating tablet 4 mg  . ketorolac (TORADOL) 30 MG/ML injection 30 mg  . dexamethasone (DECADRON) injection 10 mg  . diphenhydrAMINE  (BENADRYL) capsule 50 mg  . metoCLOPramide (REGLAN) injection 10 mg  . meclizine (ANTIVERT) 25 MG tablet    Sig: Take 1 tablet (25 mg total) by mouth 3 (three) times daily as needed for dizziness.    Dispense:  30 tablet    Refill:  0  . ibuprofen (ADVIL) 600 MG tablet    Sig: Take 1 tablet (600 mg total) by mouth every 6 (six) hours as needed.    Dispense:  30 tablet    Refill:  0  . ondansetron (ZOFRAN ODT) 8 MG disintegrating tablet    Sig: 1/2- 1 tablet q 8 hr prn nausea, vomiting    Dispense:  20 tablet    Refill:  0    *This clinic note was created using [employer]. Therefore, there may be occasional mistakes despite careful proofreading.  ?    Melynda Ripple, MD 12/17/19 [phone]

## 2019-12-16 NOTE — Discharge Instructions (Addendum)
Follow-up with [facility] for [facility] neurolog[facility] because your headaches are changing. You may take 600 mg of ibuprofen combined with 1000 mg of Tylenol 3-4 times a day as needed for headache. Zofran will help with the nausea and vomiting. Meclizine will help with the vertigo. I suspect that once your headache gets better, the vertigo will subside. Go immediately to the ER if your headache changes, gets worse, if you're not better by evening, fevers above 100.4, neck stiffness, arm or leg weakness, facial droop, slurred speech, if your vertigo gets worse.

## 2020-04-18 ENCOUNTER — Other Ambulatory Visit: Payer: Self-pay

## 2020-04-18 ENCOUNTER — Ambulatory Visit
Admission: EM | Admit: 2020-04-18 | Discharge: 2020-04-18 | Disposition: A | Discharge status: Home / Self Care | Payer: Self-pay | Attending: Emergency Medicine | Admitting: Emergency Medicine

## 2020-04-18 DIAGNOSIS — L0291 Cutaneous abscess, unspecified: Secondary | ICD-10-CM | POA: Insufficient documentation

## 2020-04-18 DIAGNOSIS — J029 Acute pharyngitis, unspecified: Secondary | ICD-10-CM | POA: Insufficient documentation

## 2020-04-18 LAB — POCT RAPID STREP A (OFFICE): Rapid Strep A Screen: NEGATIVE

## 2020-04-18 MED ORDER — DOXYCYCLINE HYCLATE 100 MG PO CAPS
100.0000 mg | ORAL_CAPSULE | Freq: Two times a day (BID) | ORAL | 0 refills | Status: DC
Start: 2020-04-18 — End: 2021-12-19

## 2020-04-18 MED ORDER — LIDOCAINE VISCOUS HCL 2 % MT SOLN
15.0000 mL | OROMUCOSAL | 0 refills | Status: DC | PRN
Start: 2020-04-18 — End: 2021-12-19

## 2020-04-18 MED ORDER — CETIRIZINE HCL 10 MG PO TABS: 10.0000 mg | ORAL_TABLET | Freq: Every day | ORAL | 0 refills | Status: DC

## 2020-04-18 NOTE — ED Triage Notes (Signed)
Pt presents with sore throat and swollen tonsils and also has ingrown hair in groin area that has become infected

## 2020-04-18 NOTE — ED Provider Notes (Signed)
[place]   [id number] 04/18/20 Arrival Time: 1915  Chief Complaint  Patient presents with  . Sore Throat  . Abscess    SUBJECTIVE: History from: patient.  Amber Ray is a 29 y.o. female who presents with a complaint of sore throat and swollen tonsils for the past 2 days.  Denies sick exposure to strep, flu or mono, or precipitating event.  Has tried OTC medication without relief.  Symptoms are made worse with swallowing, but tolerating liquids and own secretions without difficulty.  Denies previous symptoms in the past.  Denies fever, chills, fatigue, ear pain, sinus pain, rhinorrhea, nasal congestion, cough, SOB, wheezing, chest pain, nausea, rash, changes in bowel or bladder habits.    She is also complaining of abscess in her groin area that has become infected for the past few days.  Reported redness and drainage. Has used OTC medication without relief.  Denies chills, fever, nausea, vomiting, diarrhea, chest pain, chest tightness, confusion.    Received flu shot this year: no.  ROS: As per HPI.  All other pertinent ROS negative.     Past Medical History:  Diagnosis Date  . Anxiety   . Bipolar affective disorder, depressed, mild ([place])   . Chlamydia   . Depression   . Dermoid cyst   . Dermoid cyst of ovary 08/2011   2.5 cm on right ovary  . Gonorrhea   . PCOS (polycystic ovarian syndrome)    Past Surgical History:  Procedure Laterality Date  . HYSTEROSCOPY WITH D & C N/A 01/30/2015   Procedure: DILATATION AND CURETTAGE /HYSTEROSCOPY;  Surgeon: Donnamae Jude, MD;  Location: [facility];  Service: Gynecology;  Laterality: N/A;  . LAPAROSCOPY  07/27/2012   Procedure: LAPAROSCOPY OPERATIVE;  Surgeon: Mora Bellman, MD;  Location: [facility];  Service: Gynecology;  Laterality: N/A;  . OVARIAN CYST REMOVAL  07/27/2012   Procedure: OVARIAN CYSTECTOMY;  Surgeon: Mora Bellman, MD;  Location: [facility];  Service: Gynecology;  Laterality: Right;  Dermoid Cyst   No Known  Allergies No current facility-administered medications on file prior to encounter.   Current Outpatient Medications on File Prior to Encounter  Medication Sig Dispense Refill  . fluticasone (FLONASE) 50 MCG/ACT nasal spray Place 2 sprays into both nostrils daily. 1 g 0  . ibuprofen (ADVIL) 600 MG tablet Take 1 tablet (600 mg total) by mouth every 6 (six) hours as needed. 30 tablet 0  . meclizine (ANTIVERT) 25 MG tablet Take 1 tablet (25 mg total) by mouth 3 (three) times daily as needed for dizziness. 30 tablet 0  . ondansetron (ZOFRAN ODT) 8 MG disintegrating tablet 1/2- 1 tablet q 8 hr prn nausea, vomiting 20 tablet 0   Social History   Socioeconomic History  . Marital status: Single    Spouse name: Not on file  . Number of children: Not on file  . Years of education: Not on file  . Highest education level: Not on file  Occupational History  . Not on file  Tobacco Use  . Smoking status: Current Every Day Smoker    Packs/day: 0.50    Years: 6.00    Pack years: 3.00    Types: Cigarettes  . Smokeless tobacco: Never Used  Vaping Use  . Vaping Use: Never used  Substance and Sexual Activity  . Alcohol use: Yes    Alcohol/week: 1.0 standard drink    Types: 1 Standard drinks or equivalent per week    Comment: occasionally  . Drug use:  No  . Sexual activity: Yes    Birth control/protection: None  Other Topics Concern  . Not on file  Social History Narrative  . Not on file   Social Determinants of Health   Financial Resource Strain:   . Difficulty of Paying Living Expenses:   Food Insecurity:   . Worried About Charity fundraiser in the Last Year:   . Arboriculturist in the Last Year:   Transportation Needs:   . Film/video editor (Medical):   Marland Kitchen Lack of Transportation (Non-Medical):   Physical Activity:   . Days of Exercise per Week:   . Minutes of Exercise per Session:   Stress:   . Feeling of Stress :   Social Connections:   . Frequency of Communication with  Friends and Family:   . Frequency of Social Gatherings with Friends and Family:   . Attends Religious Services:   . Active Member of Clubs or Organizations:   . Attends Archivist Meetings:   Marland Kitchen Marital Status:   Intimate Partner Violence:   . Fear of Current or Ex-Partner:   . Emotionally Abused:   Marland Kitchen Physically Abused:   . Sexually Abused:    Family History  Problem Relation Age of Onset  . Diabetes Maternal Grandmother   . Arthritis Maternal Grandmother   . Arthritis Maternal Grandfather   . Diabetes Paternal Grandmother   . Hypertension Paternal Grandmother   . Arthritis Paternal Grandmother   . Kidney disease Paternal Grandmother   . Arthritis Paternal Grandfather     OBJECTIVE:  Vitals:   04/18/20 1929  BP: 112/77  Pulse: 84  Resp: 18  Temp: 97.7 F (36.5 C)  SpO2: 97%     Physical Exam Vitals and nursing note reviewed.  Constitutional:      General: She is not in acute distress.    Appearance: Normal appearance. She is normal weight. She is not ill-appearing, toxic-appearing or diaphoretic.  HENT:     Head: Normocephalic.     Right Ear: Tympanic membrane, ear canal and external ear normal. There is no impacted cerumen.     Left Ear: Tympanic membrane, ear canal and external ear normal. There is no impacted cerumen.     Mouth/Throat:     Lips: Pink.     Mouth: Mucous membranes are moist.     Pharynx: Oropharynx is clear. No pharyngeal swelling.     Tonsils: Tonsillar exudate present. No tonsillar abscesses. 1+ on the right. 1+ on the left.  Cardiovascular:     Rate and Rhythm: Normal rate and regular rhythm.     Pulses: Normal pulses.     Heart sounds: Normal heart sounds. No murmur heard.  No friction rub. No gallop.   Pulmonary:     Effort: Pulmonary effort is normal. No respiratory distress.     Breath sounds: Normal breath sounds. No stridor. No wheezing, rhonchi or rales.  Chest:     Chest wall: No tenderness.  Neurological:     Mental  Status: She is alert.     LABS: No results found for this or any previous visit (from the past 24 hour(s)).   ASSESSMENT & PLAN:  1. Sore throat   2. Abscess    Patient is stable at discharge.  Doxycycline was prescribed for for abscess.  Will await throat culture.    Meds ordered this encounter  Medications  . doxycycline (VIBRAMYCIN) 100 MG capsule    Sig: Take 1 capsule (  100 mg total) by mouth 2 (two) times daily.    Dispense:  20 capsule    Refill:  0  . lidocaine (XYLOCAINE) 2 % solution    Sig: Use as directed 15 mLs in the mouth or throat as needed for mouth pain.    Dispense:  100 mL    Refill:  0  . cetirizine (ZYRTEC ALLERGY) 10 MG tablet    Sig: Take 1 tablet (10 mg total) by mouth daily.    Dispense:  30 tablet    Refill:  0   Discharge instructions  Strep test negative, will send out for culture and we will call you with results Get plenty of rest and push fluids Zyrtec prescribed. Use daily for symptomatic relief Viscous lidocaine prescribed.  This is an oral solution you can swish, and gargle as needed for symptomatic relief of sore throat.  Do not exceed 8 doses in a 24 hour period.  Do not use prior to eating, as this will numb your entire mouth.   Drink warm or cool liquids, use throat lozenges, or popsicles to help alleviate symptoms Take OTC ibuprofen or tylenol as needed for pain Follow up with PCP if symptoms persists Return or go to ER if patient has any new or worsening symptoms such as fever, chills, nausea, vomiting, worsening sore throat, cough, abdominal pain, chest pain, changes in bowel or bladder habits, etc...  Reviewed expectations re: course of current medical issues. Questions answered. Outlined signs and symptoms indicating need for more acute intervention. Patient verbalized understanding. After Visit Summary given.        Emerson Monte, FNP 04/18/20 2003

## 2020-04-18 NOTE — Discharge Instructions (Addendum)
Strep test negative, will send out for culture and we will call you with results Get plenty of rest and push fluids Zyrtec prescribed. Use daily for symptomatic relief Viscous lidocaine prescribed.  This is an oral solution you can swish, and gargle as needed for symptomatic relief of sore throat.  Do not exceed 8 doses in a 24 hour period.  Do not use prior to eating, as this will numb your entire mouth.   Drink warm or cool liquids, use throat lozenges, or popsicles to help alleviate symptoms Take OTC ibuprofen or tylenol as needed for pain Follow up with PCP if symptoms persists Return or go to ER if patient has any new or worsening symptoms such as fever, chills, nausea, vomiting, worsening sore throat, cough, abdominal pain, chest pain, changes in bowel or bladder habits, etc..Marland Kitchen

## 2020-04-22 LAB — CULTURE, GROUP A STREP (THRC)

## 2020-09-04 ENCOUNTER — Other Ambulatory Visit: Payer: Self-pay

## 2020-09-04 ENCOUNTER — Encounter: Payer: Self-pay | Admitting: Nurse Practitioner

## 2020-09-04 ENCOUNTER — Ambulatory Visit (INDEPENDENT_AMBULATORY_CARE_PROVIDER_SITE_OTHER): Payer: BC Managed Care – PPO | Admitting: Nurse Practitioner

## 2020-09-04 VITALS — BP 116/75 | HR 117 | Ht 65.0 in | Wt 207.0 lb

## 2020-09-04 DIAGNOSIS — Z3202 Encounter for pregnancy test, result negative: Secondary | ICD-10-CM | POA: Diagnosis not present

## 2020-09-04 DIAGNOSIS — Z3169 Encounter for other general counseling and advice on procreation: Secondary | ICD-10-CM

## 2020-09-04 DIAGNOSIS — E282 Polycystic ovarian syndrome: Secondary | ICD-10-CM | POA: Diagnosis not present

## 2020-09-04 DIAGNOSIS — Z6834 Body mass index (BMI) 34.0-34.9, adult: Secondary | ICD-10-CM

## 2020-09-04 LAB — POCT PREGNANCY, URINE: Preg Test, Ur: NEGATIVE

## 2020-09-04 MED ORDER — METFORMIN HCL 500 MG PO TABS: 500.0000 mg | ORAL_TABLET | Freq: Two times a day (BID) | ORAL | 2 refills | Status: DC

## 2020-09-04 NOTE — Progress Notes (Signed)
16 days late for cycle  Has PCOS Had nuva ring then switched to pill Last pap in jan   Seeking to be pregnant

## 2020-09-04 NOTE — Patient Instructions (Signed)
Check out Covid vaccine info here:   [email or web address].[email or web address]/  7 Things You Should Know About the COVID-19 Vaccines  Here are seven facts you should know before taking your shot:  1.  No serious side effects were reported in clinical trials. Temporary reactions after receiving the vaccine may include a sore arm, headache, feeling tired and achy for a day or two or, in some cases, fever. In most cases, these reactions are good signs that your body is building protection.   2.  Scientists had a head start. They are built on decades of research on vaccines for similar viruses. A big investment of resources and focus made sure they were created without skipping any steps in development, testing, or clinical trials.  3. You cannot get COVID-19 from the vaccine. The vaccine gives your body instructions to make a protein that safely teaches you to make germ-fighting antibodies to fight the real COVID-19.  4.The vaccine protects against the Delta variant. The Delta variant, which is now predominant in [place], is much more contagious than the original virus. Vaccines continue to be remarkably effective in reducing risk of severe disease, hospitalization, and death, even against the Delta variant.  5. A hundred million people in the [place] have already received their COVID-19 vaccine.  6. It works. And once you're fully vaccinated you're protected. The vaccines are proven to help prevent COVID-19 and are effective in preventing hospitalization and death.   7. The vaccine does not affect fertility. Vaccination for those who are pregnant or wanting to become pregnant is recommended by the [employer] of Obstetricians and Gynecologists (ACOG), the Society for [place] (SMFM), the [place] for Reproductive Medicine (ASRM), and the Society for Female Reproduction and Urology.

## 2020-09-04 NOTE — Progress Notes (Signed)
GYNECOLOGY OFFICE VISIT NOTE   History:  29 y.o. G0P0000 here today for questions about PCOS. She had an annual exam in January at the [facility] in [place].  She wants to be pregnant and missed her menses this month.  She has a long history of PCOS and has been using the pill and the Nuvaring for the past year.  She would like to  Be pregnant.  She has taken metformin previously and wants to start it again.  Currently she is smoking.  She denies any abnormal vaginal discharge, bleeding, pelvic pain or other concerns.   Past Medical History:  Diagnosis Date  . Anxiety   . Bipolar affective disorder, depressed, mild ([place])   . Chlamydia   . Depression   . Dermoid cyst   . Dermoid cyst of ovary 08/2011   2.5 cm on right ovary  . Gonorrhea   . PCOS (polycystic ovarian syndrome)     Past Surgical History:  Procedure Laterality Date  . HYSTEROSCOPY WITH D & C N/A 01/30/2015   Procedure: DILATATION AND CURETTAGE /HYSTEROSCOPY;  Surgeon: Donnamae Jude, MD;  Location: [facility];  Service: Gynecology;  Laterality: N/A;  . LAPAROSCOPY  07/27/2012   Procedure: LAPAROSCOPY OPERATIVE;  Surgeon: Mora Bellman, MD;  Location: [facility];  Service: Gynecology;  Laterality: N/A;  . OVARIAN CYST REMOVAL  07/27/2012   Procedure: OVARIAN CYSTECTOMY;  Surgeon: Mora Bellman, MD;  Location: [facility];  Service: Gynecology;  Laterality: Right;  Dermoid Cyst    The following portions of the patient's history were reviewed and updated as appropriate: allergies, current medications, past family history, past medical history, past social history, past surgical history and problem list.   Health Maintenance:  Normal pap in January 2021.   Review of Systems:  Pertinent items noted in HPI and remainder of comprehensive ROS otherwise negative.  Objective:  Physical Exam BP 116/75   Pulse (!) 117   Ht 5\' 5"  (1.651 m)   Wt 207 lb (93.9 kg)   BMI 34.45 kg/m  CONSTITUTIONAL: Well-developed,  well-nourished female in no acute distress.  HENT:  Normocephalic, atraumatic. External right and left ear normal.  EYES: Conjunctivae and EOM are normal. Pupils are equal, round.  No scleral icterus.  NECK: Normal range of motion, supple, no masses SKIN: Skin is warm and dry. No rash noted. Not diaphoretic. No erythema. No pallor. NEUROLOGIC: Alert and oriented to person, place, and time. Normal muscle tone coordination. No cranial nerve deficit noted. PSYCHIATRIC: Normal mood and affect. Normal behavior. Normal judgment and thought content. CARDIOVASCULAR: Normal heart rate noted  No murmur. RESPIRATORY: Effort and breath sounds normal, no problems with respiration noted ABDOMEN: Soft, no distention noted.   PELVIC: Deferred MUSCULOSKELETAL: Normal range of motion. No edema noted.  Labs and Imaging No results found.  Assessment & Plan:  1. PCOS (polycystic ovarian syndrome) Prescribed metformin PO bid as she has been taking previously Advised to stop smoking and advised that behavioral health is available in the office for assistance. Advised to take the Covid vaccine (not given in this office) but client is not interested in getting the vaccine.  Vaccine info added to her AVS.  2. Infertility counseling Refer to [place] Discussed using ovulation kits to get info about when she ovulates as this is the most fertile time and it is hard to know if you are not having regular menses. Recommended seeing specialist soon as she is now age 54.  - Ambulatory referral  to Infertility  3. BMI 34.0-34.9,adult Advised a 10% weight loss would be helpful for ovulation to begin again. Use healthy diet and increasing exercise to lose weight.   Routine preventative health maintenance measures emphasized. Please refer to After Visit Summary for other counseling recommendations.   No follow-ups on file.  Has had annual physical for 2021 and wants referral for infertility.   Total face-to-face  time with patient: 20 minutes.  Over 50% of encounter was spent on counseling and coordination of care.  Earlie Server, RN, MSN, NP-BC Nurse Practitioner, [facility] for [employer], [facility] 09/04/2020 4:08 PM

## 2020-11-14 ENCOUNTER — Other Ambulatory Visit: Payer: Self-pay

## 2020-11-14 ENCOUNTER — Encounter (HOSPITAL_COMMUNITY): Payer: Self-pay

## 2020-11-14 ENCOUNTER — Ambulatory Visit (HOSPITAL_COMMUNITY)
Admission: RE | Admit: 2020-11-14 | Discharge: 2020-11-14 | Disposition: A | Discharge status: Home / Self Care | Payer: BC Managed Care – PPO | Source: Ambulatory Visit | Attending: Family Medicine | Admitting: Family Medicine

## 2020-11-14 VITALS — BP 122/88 | HR 106 | Temp 98.5°F | Resp 20

## 2020-11-14 DIAGNOSIS — J069 Acute upper respiratory infection, unspecified: Secondary | ICD-10-CM

## 2020-11-14 DIAGNOSIS — U071 COVID-19: Secondary | ICD-10-CM | POA: Diagnosis not present

## 2020-11-14 NOTE — ED Triage Notes (Signed)
Pt presents with ongoing headache, generalized body aches, fatigue, and congestion since Saturday.  Pt was tested for covid last thursday which was negative and started developing symptoms Sunday.

## 2020-11-14 NOTE — Discharge Instructions (Addendum)
You have been tested for COVID-19 today. °If your test returns positive, you will receive a phone call from [facility] regarding your results. °Negative test results are not called. °Both positive and negative results area always visible on MyChart. °If you do not have a MyChart account, sign up instructions are provided in your discharge papers. °Please do not hesitate to contact us should you have questions or concerns. ° °

## 2020-11-14 NOTE — ED Provider Notes (Signed)
[facility]   [id number] 11/14/20 Arrival Time: 3716  ASSESSMENT & PLAN:  1. Viral URI      COVID-19 testing sent. See letter/work note on file for self-isolation guidelines. OTC symptom care as needed.    Follow-up Information    [facility].   Specialty: Urgent Care Why: As needed. Contact information: [address] [place] [address] [phone]              Reviewed expectations re: course of current medical issues. Questions answered. Outlined signs and symptoms indicating need for more acute intervention. Understanding verbalized. After Visit Summary given.   SUBJECTIVE: History from: patient. Amber Ray is a 30 y.o. female who presents with worries regarding COVID-19. Known COVID-19 contact: none. Recent travel: none. Reports: HA, body aches, fatigue, congestion. Approx 4 days; abrupt onset. Denies: fever/SOB. Normal PO intake without n/v/d.    OBJECTIVE:  Vitals:   11/14/20 1024  BP: 122/88  Pulse: (!) 106  Resp: 20  Temp: 98.5 F (36.9 C)  TempSrc: Oral  SpO2: 95%    Slight tachycardia noted General appearance: alert; no distress Eyes: PERRLA; EOMI; conjunctiva normal HENT: Downs; AT; with nasal congestion Neck: supple  Lungs: speaks full sentences without difficulty; unlabored Extremities: no edema Skin: warm and dry Neurologic: normal gait Psychological: alert and cooperative; normal mood and affect  Labs:  Labs Reviewed  SARS CORONAVIRUS 2 (TAT 6-24 HRS)    No Known Allergies  Past Medical History:  Diagnosis Date  . Anxiety   . Bipolar affective disorder, depressed, mild ([facility])   . Chlamydia   . Depression   . Dermoid cyst   . Dermoid cyst of ovary 08/2011   2.5 cm on right ovary  . Gonorrhea   . PCOS (polycystic ovarian syndrome)    Social History   Socioeconomic History  . Marital status: Single    Spouse name: Not on file  . Number of children: Not on file   . Years of education: Not on file  . Highest education level: Not on file  Occupational History  . Not on file  Tobacco Use  . Smoking status: Current Every Day Smoker    Packs/day: 0.50    Years: 6.00    Pack years: 3.00    Types: Cigarettes  . Smokeless tobacco: Never Used  Vaping Use  . Vaping Use: Never used  Substance and Sexual Activity  . Alcohol use: Yes    Alcohol/week: 1.0 standard drink    Types: 1 Standard drinks or equivalent per week    Comment: occasionally  . Drug use: No  . Sexual activity: Yes    Birth control/protection: None  Other Topics Concern  . Not on file  Social History Narrative  . Not on file   Social Determinants of Health   Financial Resource Strain: Not on file  Food Insecurity: Not on file  Transportation Needs: Not on file  Physical Activity: Not on file  Stress: Not on file  Social Connections: Not on file  Intimate Partner Violence: Not on file   Family History  Problem Relation Age of Onset  . Diabetes Maternal Grandmother   . Arthritis Maternal Grandmother   . Arthritis Maternal Grandfather   . Diabetes Paternal Grandmother   . Hypertension Paternal Grandmother   . Arthritis Paternal Grandmother   . Kidney disease Paternal Grandmother   . Arthritis Paternal Grandfather    Past Surgical History:  Procedure Laterality Date  .  HYSTEROSCOPY WITH D & C N/A 01/30/2015   Procedure: DILATATION AND CURETTAGE /HYSTEROSCOPY;  Surgeon: Reva Bores, MD;  Location: [facility];  Service: Gynecology;  Laterality: N/A;  . LAPAROSCOPY  07/27/2012   Procedure: LAPAROSCOPY OPERATIVE;  Surgeon: Catalina Antigua, MD;  Location: [facility];  Service: Gynecology;  Laterality: N/A;  . OVARIAN CYST REMOVAL  07/27/2012   Procedure: OVARIAN CYSTECTOMY;  Surgeon: Catalina Antigua, MD;  Location: [facility];  Service: Gynecology;  Laterality: Right;  Dermoid Cyst     Mardella Layman, MD 11/14/20 1034

## 2020-11-15 LAB — SARS CORONAVIRUS 2 (TAT 6-24 HRS): SARS Coronavirus 2: POSITIVE — AB

## 2021-04-17 ENCOUNTER — Ambulatory Visit (INDEPENDENT_AMBULATORY_CARE_PROVIDER_SITE_OTHER): Payer: BC Managed Care – PPO

## 2021-04-17 ENCOUNTER — Other Ambulatory Visit: Payer: Self-pay

## 2021-04-17 ENCOUNTER — Ambulatory Visit (HOSPITAL_COMMUNITY)
Admission: EM | Admit: 2021-04-17 | Discharge: 2021-04-17 | Disposition: A | Discharge status: Home / Self Care | Payer: BC Managed Care – PPO | Attending: Family Medicine | Admitting: Family Medicine

## 2021-04-17 ENCOUNTER — Encounter (HOSPITAL_COMMUNITY): Payer: Self-pay

## 2021-04-17 DIAGNOSIS — M79671 Pain in right foot: Secondary | ICD-10-CM

## 2021-04-17 DIAGNOSIS — M25561 Pain in right knee: Secondary | ICD-10-CM | POA: Diagnosis not present

## 2021-04-17 MED ORDER — PREDNISONE 20 MG PO TABS
40.0000 mg | ORAL_TABLET | Freq: Every day | ORAL | 0 refills | Status: DC
Start: 2021-04-17 — End: 2021-12-19

## 2021-04-17 MED ORDER — DICLOFENAC SODIUM 75 MG PO TBEC
75.0000 mg | DELAYED_RELEASE_TABLET | Freq: Two times a day (BID) | ORAL | 0 refills | Status: DC
Start: 2021-04-17 — End: 2021-12-19

## 2021-04-17 NOTE — ED Provider Notes (Signed)
[facility]   [id number] 04/17/21 Arrival Time: 7510  ASSESSMENT & PLAN:  1. Right foot pain   2. Acute pain of right knee    I have personally viewed the imaging studies ordered this visit. No fx appreciated.  Work note provided.  Begin: Meds ordered this encounter  Medications   diclofenac (VOLTAREN) 75 MG EC tablet    Sig: Take 1 tablet (75 mg total) by mouth 2 (two) times daily.    Dispense:  14 tablet    Refill:  0   predniSONE (DELTASONE) 20 MG tablet    Sig: Take 2 tablets (40 mg total) by mouth daily.    Dispense:  10 tablet    Refill:  0    Recommend:  Follow-up Information     [facility].   Why: If worsening or failing to improve as anticipated. Contact information: [address] [place] [phone]                Reviewed expectations re: course of current medical issues. Questions answered. Outlined signs and symptoms indicating need for more acute intervention. Patient verbalized understanding. After Visit Summary given.  SUBJECTIVE: History from: patient. Amber Ray is a 30 y.o. female who reports fairly persistent moderate pain of her right plantar midfoot followed by medial right knee pain; described as aching; without radiation. Onset: gradual. First noted:  3 d ago after several days of prolonged ambulation while on vacation . Injury/trama: no. Symptoms have progressed to a point and plateaued since beginning. Aggravating factors: weight bearing. Alleviating factors: have not been identified. Associated symptoms: none reported. Extremity sensation changes or weakness: none. Self treatment: acetaminophen, with minimal relief.  History of similar: no.  Past Surgical History:  Procedure Laterality Date   HYSTEROSCOPY WITH D & C N/A 01/30/2015   Procedure: DILATATION AND CURETTAGE /HYSTEROSCOPY;  Surgeon: Donnamae Jude, MD;  Location: [facility];  Service:  Gynecology;  Laterality: N/A;   LAPAROSCOPY  07/27/2012   Procedure: LAPAROSCOPY OPERATIVE;  Surgeon: Mora Bellman, MD;  Location: [facility];  Service: Gynecology;  Laterality: N/A;   OVARIAN CYST REMOVAL  07/27/2012   Procedure: OVARIAN CYSTECTOMY;  Surgeon: Mora Bellman, MD;  Location: [facility];  Service: Gynecology;  Laterality: Right;  Dermoid Cyst      OBJECTIVE:  Vitals:   04/17/21 1013 04/17/21 1014  BP: 113/70   Pulse: 98   Temp:  98.6 F (37 C)  TempSrc:  Oral  SpO2: 98%     General appearance: alert; no distress HEENT: [place]; AT Neck: supple with FROM Resp: unlabored respirations Extremities: RLE: warm with well perfused appearance; fairly well localized mild to moderate tenderness over right mid plantar foot and right medial knee over MCL; without gross deformities; swelling: none; bruising: none; knee and ankle ROM: normal Skin: warm and dry; no visible rashes Neurologic: gait normal; normal sensation and strength of RLE Psychological: alert and cooperative; normal mood and affect  Imaging: DG Foot Complete Right  Result Date: 04/17/2021 CLINICAL DATA:  Midfoot pain.  Rule out stress fracture. EXAM: RIGHT FOOT COMPLETE - 3+ VIEW COMPARISON:  None. FINDINGS: No fracture. No subluxation or dislocation. No cortical thickening or sclerotic changes in the metacarpals or elsewhere to suggest the presence of a stress fracture/stress response. IMPRESSION: No acute bony abnormality. No bony findings to explain the patient's history of pain. Electronically Signed   By: Misty Stanley M.D.   On: 04/17/2021  11:24       No Known Allergies  Past Medical History:  Diagnosis Date   Anxiety    Bipolar affective disorder, depressed, mild (HCC)    Chlamydia    Depression    Dermoid cyst    Dermoid cyst of ovary 08/2011   2.5 cm on right ovary   Gonorrhea    PCOS (polycystic ovarian syndrome)    Social History   Socioeconomic History   Marital status: Single    Spouse name: Not  on file   Number of children: Not on file   Years of education: Not on file   Highest education level: Not on file  Occupational History   Not on file  Tobacco Use   Smoking status: Every Day    Packs/day: 0.50    Years: 6.00    Pack years: 3.00    Types: Cigarettes   Smokeless tobacco: Never  Vaping Use   Vaping Use: Never used  Substance and Sexual Activity   Alcohol use: Yes    Alcohol/week: 1.0 standard drink    Types: 1 Standard drinks or equivalent per week    Comment: occasionally   Drug use: No   Sexual activity: Yes    Birth control/protection: None  Other Topics Concern   Not on file  Social History Narrative   Not on file   Social Determinants of Health   Financial Resource Strain: Not on file  Food Insecurity: Not on file  Transportation Needs: Not on file  Physical Activity: Not on file  Stress: Not on file  Social Connections: Not on file   Family History  Problem Relation Age of Onset   Diabetes Maternal Grandmother    Arthritis Maternal Grandmother    Arthritis Maternal Grandfather    Diabetes Paternal Grandmother    Hypertension Paternal Grandmother    Arthritis Paternal Grandmother    Kidney disease Paternal Grandmother    Arthritis Paternal Grandfather    Past Surgical History:  Procedure Laterality Date   HYSTEROSCOPY WITH D & C N/A 01/30/2015   Procedure: DILATATION AND CURETTAGE /HYSTEROSCOPY;  Surgeon: Donnamae Jude, MD;  Location: [facility];  Service: Gynecology;  Laterality: N/A;   LAPAROSCOPY  07/27/2012   Procedure: LAPAROSCOPY OPERATIVE;  Surgeon: Mora Bellman, MD;  Location: [facility];  Service: Gynecology;  Laterality: N/A;   OVARIAN CYST REMOVAL  07/27/2012   Procedure: OVARIAN CYSTECTOMY;  Surgeon: Mora Bellman, MD;  Location: [facility];  Service: Gynecology;  Laterality: Right;  Dermoid Cyst       Vanessa Kick, MD 04/17/21 1345

## 2021-04-17 NOTE — ED Triage Notes (Signed)
Pt c/o stiffness in her right knee.  She states for the past 3 days she has been applying more pressure on her foot. She states the pain has moved to her ankle. She states she has been having on and off swelling.

## 2021-12-19 ENCOUNTER — Ambulatory Visit (HOSPITAL_COMMUNITY)
Admission: EM | Admit: 2021-12-19 | Discharge: 2021-12-19 | Disposition: A | Discharge status: Home / Self Care | Payer: BC Managed Care – PPO | Attending: Family Medicine | Admitting: Family Medicine

## 2021-12-19 ENCOUNTER — Encounter (HOSPITAL_COMMUNITY): Payer: Self-pay

## 2021-12-19 DIAGNOSIS — N6452 Nipple discharge: Secondary | ICD-10-CM | POA: Insufficient documentation

## 2021-12-19 DIAGNOSIS — N644 Mastodynia: Secondary | ICD-10-CM | POA: Insufficient documentation

## 2021-12-19 DIAGNOSIS — N6341 Unspecified lump in right breast, subareolar: Secondary | ICD-10-CM | POA: Insufficient documentation

## 2021-12-19 MED ORDER — CEPHALEXIN 500 MG PO CAPS: 500.0000 mg | ORAL_CAPSULE | Freq: Three times a day (TID) | ORAL | 0 refills | Status: DC

## 2021-12-19 NOTE — ED Triage Notes (Signed)
Pt presents rt breast pain. Pt states yesterday the pain began and during a breast examination she had thick Regla Fitzgibbon and blood tinged discharge came out of her nipple. Pt denies lactating or breastfeeding. Pt states breast is hot to the touch

## 2021-12-19 NOTE — ED Provider Notes (Signed)
[facility]    CSN: [id number] Arrival date & time: 12/19/21  1014      History   Chief Complaint Chief Complaint  Patient presents with   Breast Pain    rt    HPI Amber Ray is a 31 y.o. female.   She was laying down last night, and noted pain at the right breast.  She felt a knot at her nipple, just above this, and pain around the nipple.  She lifted her breast to look at the nipple, and noted white/bloody d/c.  She kept squeezing, wiped it several times, then just blood came out.  This am she lifted her breast and saw some secretions this morning.  This morning the knot is gone, but still painful and sensative.  No fevers/chills.  She had a mammogram years ago for a LN in her right axilla.  She does have a remote h/o breast cancer on her mother side.  She is not breast feeding, no children.   Past Medical History:  Diagnosis Date   Anxiety    Bipolar affective disorder, depressed, mild (HCC)    Chlamydia    Depression    Dermoid cyst    Dermoid cyst of ovary 08/2011   2.5 cm on right ovary   Gonorrhea    PCOS (polycystic ovarian syndrome)     Patient Active Problem List   Diagnosis Date Noted   Vaginal lesion 01/30/2015   Menorrhagia with irregular cycle 12/19/2014   PCOS (polycystic ovarian syndrome) 08/21/2013   Dermoid cyst 07/06/2012    Past Surgical History:  Procedure Laterality Date   HYSTEROSCOPY WITH D & C N/A 01/30/2015   Procedure: DILATATION AND CURETTAGE /HYSTEROSCOPY;  Surgeon: Donnamae Jude, MD;  Location: [facility];  Service: Gynecology;  Laterality: N/A;   LAPAROSCOPY  07/27/2012   Procedure: LAPAROSCOPY OPERATIVE;  Surgeon: Mora Bellman, MD;  Location: [facility];  Service: Gynecology;  Laterality: N/A;   OVARIAN CYST REMOVAL  07/27/2012   Procedure: OVARIAN CYSTECTOMY;  Surgeon: Mora Bellman, MD;  Location: [facility];  Service: Gynecology;  Laterality: Right;  Dermoid Cyst    OB History     Gravida  0   Para      Term       Preterm      AB  0   Living  0      SAB  0   IAB      Ectopic      Multiple      Live Births               Home Medications    Prior to Admission medications   Medication Sig Start Date End Date Taking? Authorizing Provider  metFORMIN (GLUCOPHAGE) 500 MG tablet Take 1 tablet (500 mg total) by mouth 2 (two) times daily with a meal. 09/04/20   Burleson, Rona Ravens, NP    Family History Family History  Problem Relation Age of Onset   Diabetes Maternal Grandmother    Arthritis Maternal Grandmother    Arthritis Maternal Grandfather    Diabetes Paternal Grandmother    Hypertension Paternal Grandmother    Arthritis Paternal Grandmother    Kidney disease Paternal Grandmother    Arthritis Paternal Grandfather     Social History Social History   Tobacco Use   Smoking status: Every Day    Packs/day: 0.50    Years: 6.00    Pack years: 3.00    Types: Cigarettes   Smokeless  tobacco: Never  Vaping Use   Vaping Use: Never used  Substance Use Topics   Alcohol use: Yes    Alcohol/week: 1.0 standard drink    Types: 1 Standard drinks or equivalent per week    Comment: occasionally   Drug use: No     Allergies   Patient has no known allergies.   Review of Systems Review of Systems  Constitutional: Negative.   HENT: Negative.    Respiratory: Negative.    Cardiovascular: Negative.   Gastrointestinal: Negative.   Endocrine: Negative.   Genitourinary: Negative.     Physical Exam Triage Vital Signs ED Triage Vitals  Enc Vitals Group     BP 12/19/21 1025 119/70     Pulse Rate 12/19/21 1025 92     Resp 12/19/21 1025 14     Temp 12/19/21 1025 98.2 F (36.8 C)     Temp Source 12/19/21 1025 Oral     SpO2 12/19/21 1025 98 %     Weight --      Height --      Head Circumference --      Peak Flow --      Pain Score 12/19/21 1027 6     Pain Loc --      Pain Edu? --      Excl. in [place]? --    No data found.  Updated Vital Signs BP 119/70 (BP Location: Left  Arm)    Pulse 92    Temp 98.2 F (36.8 C) (Oral)    Resp 14    SpO2 98%   Visual Acuity Right Eye Distance:   Left Eye Distance:   Bilateral Distance:    Right Eye Near:   Left Eye Near:    Bilateral Near:     Physical Exam Constitutional:      Appearance: Normal appearance.  Chest:     Comments: There is no obvious swelling or deformity to the right breast upon inspection;  I am unable to squeeze any discharge, but the patient is able to squeeze a small amount;  there is TTP to the breast at the superior portion of the areola;  no obvious lump or mass is noted on exam today (patient states this has resolved) Neurological:     Mental Status: She is alert.     UC Treatments / Results  Labs (all labs ordered are listed, but only abnormal results are displayed) Labs Reviewed - No data to display  EKG   Radiology No results found.  Procedures Procedures (including critical care time)  Medications Ordered in UC Medications - No data to display  Initial Impression / Assessment and Plan / UC Course  I have reviewed the triage vital signs and the nursing notes.  Pertinent labs & imaging results that were available during my care of the patient were reviewed by me and considered in my medical decision making (see chart for details).   Patient was seen today for having a tender lump yesterday, with bloody/milky d/c.   Today the lump has resolved, but she is still quite tender with a small amount of d/c.  I am treating her for possible cyst/abscess given the d/c and pain.  I have ordered a diagnostic mammogram as well given her symptoms.  Advised they will call her for an appointment.   Final Clinical Impressions(s) / UC Diagnoses   Final diagnoses:  Breast discharge  Breast pain, right  Subareolar mass of right breast     Discharge  Instructions      You were seen today for breast lump, pain and discharge.  I did sent a sample for culture and sent out keflex to take for  possible abscess.   The culture will be resulted in the next 48-72 hrs.  I have ordered a mammogram.  They should contact you to set this up.  You may also call at [phone] for an appointment.     ED Prescriptions     Medication Sig Dispense Auth. Provider   cephALEXin (KEFLEX) 500 MG capsule Take 1 capsule (500 mg total) by mouth 3 (three) times daily. 21 capsule Rondel Oh, MD      PDMP not reviewed this encounter.   Rondel Oh, MD 12/19/21 [phone]

## 2021-12-19 NOTE — Discharge Instructions (Signed)
You were seen today for breast lump, pain and discharge.  I did sent a sample for culture and sent out keflex to take for possible abscess.   The culture will be resulted in the next 48-72 hrs.  I have ordered a mammogram.  They should contact you to set this up.  You may also call at [phone] for an appointment.

## 2021-12-21 LAB — AEROBIC CULTURE W GRAM STAIN (SUPERFICIAL SPECIMEN): Culture: NORMAL

## 2022-07-07 ENCOUNTER — Emergency Department (HOSPITAL_BASED_OUTPATIENT_CLINIC_OR_DEPARTMENT_OTHER): Payer: No Typology Code available for payment source | Admitting: Radiology

## 2022-07-07 ENCOUNTER — Encounter (HOSPITAL_BASED_OUTPATIENT_CLINIC_OR_DEPARTMENT_OTHER): Payer: Self-pay

## 2022-07-07 ENCOUNTER — Other Ambulatory Visit: Payer: Self-pay

## 2022-07-07 ENCOUNTER — Telehealth (HOSPITAL_BASED_OUTPATIENT_CLINIC_OR_DEPARTMENT_OTHER): Payer: Self-pay | Admitting: Emergency Medicine

## 2022-07-07 ENCOUNTER — Emergency Department (HOSPITAL_BASED_OUTPATIENT_CLINIC_OR_DEPARTMENT_OTHER)
Admission: EM | Admit: 2022-07-07 | Discharge: 2022-07-07 | Disposition: A | Discharge status: Home / Self Care | Payer: No Typology Code available for payment source | Attending: Emergency Medicine | Admitting: Emergency Medicine

## 2022-07-07 DIAGNOSIS — Y92410 Unspecified street and highway as the place of occurrence of the external cause: Secondary | ICD-10-CM | POA: Insufficient documentation

## 2022-07-07 DIAGNOSIS — M545 Low back pain, unspecified: Secondary | ICD-10-CM | POA: Insufficient documentation

## 2022-07-07 DIAGNOSIS — S99911A Unspecified injury of right ankle, initial encounter: Secondary | ICD-10-CM | POA: Diagnosis present

## 2022-07-07 DIAGNOSIS — Z7984 Long term (current) use of oral hypoglycemic drugs: Secondary | ICD-10-CM | POA: Diagnosis not present

## 2022-07-07 DIAGNOSIS — S93401A Sprain of unspecified ligament of right ankle, initial encounter: Secondary | ICD-10-CM | POA: Diagnosis not present

## 2022-07-07 MED ORDER — DICLOFENAC SODIUM 75 MG PO TBEC: 75.0000 mg | DELAYED_RELEASE_TABLET | Freq: Two times a day (BID) | ORAL | 0 refills | Status: DC

## 2022-07-07 MED ORDER — DICLOFENAC SODIUM 1 % EX GEL: 4.0000 g | Freq: Four times a day (QID) | CUTANEOUS | 0 refills | Status: DC

## 2022-07-07 MED ORDER — METHOCARBAMOL 500 MG PO TABS: 500.0000 mg | ORAL_TABLET | Freq: Two times a day (BID) | ORAL | 0 refills | Status: DC

## 2022-07-07 MED ORDER — METHOCARBAMOL 500 MG PO TABS: 500.0000 mg | ORAL_TABLET | Freq: Four times a day (QID) | ORAL | 0 refills | Status: DC
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
## 2022-07-07 NOTE — Telephone Encounter (Signed)
I received a notification that patient's prescriptions were sent to wrong pharmacy, moved to alternative pharmacy.

## 2022-07-07 NOTE — ED Notes (Signed)
Discharge paperwork given and verbally understood. 

## 2022-11-12 ENCOUNTER — Ambulatory Visit
Admission: EM | Admit: 2022-11-12 | Discharge: 2022-11-12 | Disposition: A | Discharge status: Home / Self Care | Payer: BC Managed Care – PPO

## 2022-11-12 DIAGNOSIS — M5442 Lumbago with sciatica, left side: Secondary | ICD-10-CM | POA: Diagnosis not present

## 2022-11-12 MED ORDER — DEXAMETHASONE SODIUM PHOSPHATE 10 MG/ML IJ SOLN
10.0000 mg | Freq: Once | INTRAMUSCULAR | Status: AC
  Administered 2022-11-12: 10 mg via INTRAMUSCULAR

## 2022-11-12 MED ORDER — KETOROLAC TROMETHAMINE 30 MG/ML IJ SOLN
30.0000 mg | Freq: Once | INTRAMUSCULAR | Status: AC
  Administered 2022-11-12: 30 mg via INTRAMUSCULAR
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
## 2022-11-12 NOTE — ED Provider Notes (Signed)
[facility]    CSN: [id number] Arrival date & time: 11/12/22  1319      History   Chief Complaint No chief complaint on file.   HPI Amber Ray is a 32 y.o. female.   Patient presents today for sudden onset back pain that began today while at work when she stood up abruptly.  Reports approximately 5 months ago, she injured her back and has known disc disease and numbness that travels down her left leg which she is seeing a specialist and physical therapist for.  Reports this pain is different.  No recent fall, accident, or trauma to her back that she knows of.  The pain is severe and shoots down her left leg to her groin into her knee.  She has tried [employer] and muscle relaxant medication without much benefit.  Denies numbness or tingling in her lower extremities, weakness, decrease sensation, saddle anesthesia, new bowel or bladder incontinence, new fevers, nausea/vomiting, or dysuria/urinary frequency since the new pain began.  Reports she sees her back specialist in February for follow up.    Past Medical History:  Diagnosis Date   Anxiety    Bipolar affective disorder, depressed, mild (HCC)    Chlamydia    Depression    Dermoid cyst    Dermoid cyst of ovary 08/2011   2.5 cm on right ovary   Gonorrhea    PCOS (polycystic ovarian syndrome)     Patient Active Problem List   Diagnosis Date Noted   Vaginal lesion 01/30/2015   Menorrhagia with irregular cycle 12/19/2014   PCOS (polycystic ovarian syndrome) 08/21/2013   Dermoid cyst 07/06/2012    Past Surgical History:  Procedure Laterality Date   HYSTEROSCOPY WITH D & C N/A 01/30/2015   Procedure: DILATATION AND CURETTAGE /HYSTEROSCOPY;  Surgeon: Donnamae Jude, MD;  Location: [facility];  Service: Gynecology;  Laterality: N/A;   LAPAROSCOPY  07/27/2012   Procedure: LAPAROSCOPY OPERATIVE;  Surgeon: Mora Bellman, MD;  Location: [facility];  Service: Gynecology;  Laterality: N/A;   OVARIAN CYST REMOVAL  07/27/2012    Procedure: OVARIAN CYSTECTOMY;  Surgeon: Mora Bellman, MD;  Location: [facility];  Service: Gynecology;  Laterality: Right;  Dermoid Cyst    OB History     Gravida  0   Para      Term      Preterm      AB  0   Living  0      SAB  0   IAB      Ectopic      Multiple      Live Births               Home Medications    Prior to Admission medications   Medication Sig Start Date End Date Taking? Authorizing Provider  cyclobenzaprine (FLEXERIL) 10 MG tablet 1 tablet Orally Twice daily as needed for 30 days 09/01/22  Yes [provider]  gabapentin (NEURONTIN) 300 MG capsule 1 capsule Orally At bedtime for 30 days 09/01/22  Yes [provider]  MOBIC 7.5 MG tablet 1 tablet Orally Once a day for 30 day(s) 09/01/22  Yes [provider]  diclofenac Sodium (VOLTAREN ARTHRITIS PAIN) 1 % GEL Apply 4 g topically 4 (four) times daily. 07/07/22   Tretha Sciara, MD  metFORMIN (GLUCOPHAGE) 500 MG tablet Take 1 tablet (500 mg total) by mouth 2 (two) times daily with a meal. 09/04/20   Burleson, Rona Ravens, NP  Family History Family History  Problem Relation Age of Onset   Diabetes Maternal Grandmother    Arthritis Maternal Grandmother    Arthritis Maternal Grandfather    Diabetes Paternal Grandmother    Hypertension Paternal Grandmother    Arthritis Paternal Grandmother    Kidney disease Paternal Grandmother    Arthritis Paternal Grandfather     Social History Social History   Tobacco Use   Smoking status: Every Day    Packs/day: 0.50    Years: 6.00    Total pack years: 3.00    Types: Cigarettes   Smokeless tobacco: Never  Vaping Use   Vaping Use: Never used  Substance Use Topics   Alcohol use: Yes    Alcohol/week: 1.0 standard drink of alcohol    Types: 1 Standard drinks or equivalent per week    Comment: occasionally   Drug use: No     Allergies   Patient has no known allergies.   Review of Systems Review of Systems Per  HPI  Physical Exam Triage Vital Signs ED Triage Vitals  Enc Vitals Group     BP 11/12/22 1455 120/83     Pulse Rate 11/12/22 1455 97     Resp 11/12/22 1455 20     Temp 11/12/22 1455 98.5 F (36.9 C)     Temp Source 11/12/22 1455 Oral     SpO2 11/12/22 1455 95 %     Weight --      Height --      Head Circumference --      Peak Flow --      Pain Score 11/12/22 1458 7     Pain Loc --      Pain Edu? --      Excl. in [place]? --    No data found.  Updated Vital Signs BP 120/83 (BP Location: Right Arm)   Pulse 97   Temp 98.5 F (36.9 C) (Oral)   Resp 20   LMP 11/01/2022 (Approximate)   SpO2 95%   Visual Acuity Right Eye Distance:   Left Eye Distance:   Bilateral Distance:    Right Eye Near:   Left Eye Near:    Bilateral Near:     Physical Exam Musculoskeletal:       Legs:     Comments: Pain as depicted.  No bruising, redness, swelling, obvious deformity upon palpation.  No tenderness to palpation.  Strength bilateral lower extremities equal and sensation intact.      UC Treatments / Results  Labs (all labs ordered are listed, but only abnormal results are displayed) Labs Reviewed - No data to display  EKG   Radiology No results found.  Procedures Procedures (including critical care time)  Medications Ordered in UC Medications  ketorolac (TORADOL) 30 MG/ML injection 30 mg (30 mg Intramuscular Given 11/12/22 1546)  dexamethasone (DECADRON) injection 10 mg (10 mg Intramuscular Given 11/12/22 1546)    Initial Impression / Assessment and Plan / UC Course  I have reviewed the triage vital signs and the nursing notes.  Pertinent labs & imaging results that were available during my care of the patient were reviewed by me and considered in my medical decision making (see chart for details).   Patient is well-appearing, normotensive, afebrile, not tachycardic, not tachypneic, oxygenating well on room air.   Acute left-sided low back pain with left-sided  sciatica Suspect inflammation of sciatic nerve secondary to abrupt movement earlier today Toradol 30 mg and Decadron 10 mg IM given today  in urgent care for pain control Recommended against use of NSAIDs for the rest of the day, can start sciatic rehab Note given for work Follow-up with specialist as planned  The patient was given the opportunity to ask questions.  All questions answered to their satisfaction.  The patient is in agreement to this plan.    Final Clinical Impressions(s) / UC Diagnoses   Final diagnoses:  Acute left-sided low back pain with left-sided sciatica     Discharge Instructions      It sounds like your sciatic nerve is inflamed.  We have given you a shot of Toradol (anti-inflammatory) and Decadron (steroid shot) to help with pain.  Do not take anymore BC goody, ibuprofen, Aleve, or Advil today.   You can try the Sciatica Rehab exercises at the back of this packet.      ED Prescriptions   None    PDMP not reviewed this encounter.   Eulogio Bear, NP 11/12/22 [phone]

## 2022-11-12 NOTE — Discharge Instructions (Signed)
It sounds like your sciatic nerve is inflamed.  We have given you a shot of Toradol (anti-inflammatory) and Decadron (steroid shot) to help with pain.  Do not take anymore BC goody, ibuprofen, Aleve, or Advil today.   You can try the Sciatica Rehab exercises at the back of this packet.

## 2023-02-01 ENCOUNTER — Ambulatory Visit (HOSPITAL_COMMUNITY)
Admission: EM | Admit: 2023-02-01 | Discharge: 2023-02-01 | Disposition: A | Discharge status: Home / Self Care | Payer: BC Managed Care – PPO | Attending: Internal Medicine | Admitting: Internal Medicine

## 2023-02-01 ENCOUNTER — Encounter (HOSPITAL_COMMUNITY): Payer: Self-pay

## 2023-02-01 DIAGNOSIS — J039 Acute tonsillitis, unspecified: Secondary | ICD-10-CM

## 2023-02-01 DIAGNOSIS — Z3202 Encounter for pregnancy test, result negative: Secondary | ICD-10-CM | POA: Diagnosis not present

## 2023-02-01 LAB — POC URINE PREG, ED: Preg Test, Ur: NEGATIVE

## 2023-02-01 LAB — POCT RAPID STREP A, ED / UC: Streptococcus, Group A Screen (Direct): NEGATIVE

## 2023-02-01 MED ORDER — IBUPROFEN 800 MG PO TABS
800.0000 mg | ORAL_TABLET | Freq: Once | ORAL | Status: AC
  Administered 2023-02-01: 800 mg via ORAL

## 2023-02-01 MED ORDER — FLUCONAZOLE 150 MG PO TABS: 150.0000 mg | ORAL_TABLET | ORAL | 0 refills | Status: DC

## 2023-02-01 MED ORDER — AMOXICILLIN 500 MG PO CAPS: 500.0000 mg | ORAL_CAPSULE | Freq: Two times a day (BID) | ORAL | 0 refills | Status: AC

## 2023-02-01 MED ORDER — IBUPROFEN 800 MG PO TABS
ORAL_TABLET | ORAL | Status: AC
  Filled 2023-02-01: qty 1

## 2023-02-01 NOTE — ED Triage Notes (Signed)
Pt c/o swollen tonsils and sore throat for a week. States now the pain is radiating to rt ear. States took her left over amoxicillin BID x3 days with little relief. Taking tylenol for pain and fever. States this feels different when she had strep.

## 2023-02-01 NOTE — ED Provider Notes (Signed)
[facility]    CSN: [id number] Arrival date & time: 02/01/23  0845      History   Chief Complaint Chief Complaint  Patient presents with   Sore Throat    HPI Amber Ray is a 32 y.o. female.   Patient presents to urgent care for evaluation of sore throat that started 1 week ago and has become more severe since symptom onset.  Sore throat is currently a 7 on a scale of 0-10 and she states that this is improved after taking Tylenol.  Pain to the throat radiates to the right ear.  No tinnitus, ear drainage, recent steroid use, cough, nasal congestion, nausea, vomiting, rash, abdominal pain, or decreased oral intake.  Denies changes in voice sounds.  No shortness of breath, chest pain, heart palpitations, or weakness.  Reports generalized bodyaches, however associates this with likely fever at home and chills.  She has not checked her temperature at home but believes it has been elevated over the last few days.  She had some leftover amoxicillin from a previous infection and has been taking this twice daily for the last 3 days.  States amoxicillin "took the edge off" of the throat pain and she no longer feels as though she has a fever.  Last dose of Tylenol was last night at 6 PM.  Currently afebrile without any antipyretic in her system.  Last menstrual cycle was in January 2024, unsure of pregnancy status as she has PCOS.  She is sexually active without condoms with female partner and has not had any recent pregnancy tests.  Has not taken any ibuprofen for symptomatic relief and has not used any other over-the-counter medications to help with symptoms before coming to urgent care. Patient experiencing vaginal itching related to recent antibiotic use and requesting treatment for vaginal yeast infection due to antibiotics.   Sore Throat    Past Medical History:  Diagnosis Date   Anxiety    Bipolar affective disorder, depressed, mild (HCC)    Chlamydia    Depression    Dermoid  cyst    Dermoid cyst of ovary 08/2011   2.5 cm on right ovary   Gonorrhea    PCOS (polycystic ovarian syndrome)     Patient Active Problem List   Diagnosis Date Noted   Vaginal lesion 01/30/2015   Menorrhagia with irregular cycle 12/19/2014   PCOS (polycystic ovarian syndrome) 08/21/2013   Dermoid cyst 07/06/2012    Past Surgical History:  Procedure Laterality Date   HYSTEROSCOPY WITH D & C N/A 01/30/2015   Procedure: DILATATION AND CURETTAGE /HYSTEROSCOPY;  Surgeon: Donnamae Jude, MD;  Location: [facility];  Service: Gynecology;  Laterality: N/A;   LAPAROSCOPY  07/27/2012   Procedure: LAPAROSCOPY OPERATIVE;  Surgeon: Mora Bellman, MD;  Location: [facility];  Service: Gynecology;  Laterality: N/A;   OVARIAN CYST REMOVAL  07/27/2012   Procedure: OVARIAN CYSTECTOMY;  Surgeon: Mora Bellman, MD;  Location: [facility];  Service: Gynecology;  Laterality: Right;  Dermoid Cyst    OB History     Gravida  0   Para      Term      Preterm      AB  0   Living  0      SAB  0   IAB      Ectopic      Multiple      Live Births  Home Medications    Prior to Admission medications   Medication Sig Start Date End Date Taking? Authorizing Provider  amoxicillin (AMOXIL) 500 MG capsule Take 1 capsule (500 mg total) by mouth 2 (two) times daily for 7 days. 02/01/23 02/08/23 Yes Talbot Grumbling, FNP  fluconazole (DIFLUCAN) 150 MG tablet Take 1 tablet (150 mg total) by mouth every 3 (three) days. 02/01/23  Yes Talbot Grumbling, FNP  cyclobenzaprine (FLEXERIL) 10 MG tablet 1 tablet Orally Twice daily as needed for 30 days 09/01/22   [provider]  diclofenac Sodium (VOLTAREN ARTHRITIS PAIN) 1 % GEL Apply 4 g topically 4 (four) times daily. 07/07/22   Tretha Sciara, MD  gabapentin (NEURONTIN) 300 MG capsule 1 capsule Orally At bedtime for 30 days 09/01/22   [provider]  MOBIC 7.5 MG tablet 1 tablet Orally Once a day for 30 day(s) 09/01/22    [provider]    Family History Family History  Problem Relation Age of Onset   Diabetes Maternal Grandmother    Arthritis Maternal Grandmother    Arthritis Maternal Grandfather    Diabetes Paternal Grandmother    Hypertension Paternal Grandmother    Arthritis Paternal Grandmother    Kidney disease Paternal Grandmother    Arthritis Paternal Grandfather     Social History Social History   Tobacco Use   Smoking status: Every Day    Packs/day: 0.50    Years: 6.00    Additional pack years: 0.00    Total pack years: 3.00    Types: Cigarettes   Smokeless tobacco: Never  Vaping Use   Vaping Use: Never used  Substance Use Topics   Alcohol use: Yes    Alcohol/week: 1.0 standard drink of alcohol    Types: 1 Standard drinks or equivalent per week    Comment: occasionally   Drug use: No     Allergies   Patient has no known allergies.   Review of Systems Review of Systems Per HPI  Physical Exam Triage Vital Signs ED Triage Vitals [02/01/23 0951]  Enc Vitals Group     BP (!) 133/90     Pulse Rate 87     Resp 18     Temp 97.9 F (36.6 C)     Temp Source Oral     SpO2 96 %     Weight      Height      Head Circumference      Peak Flow      Pain Score 7     Pain Loc      Pain Edu?      Excl. in [place]?    No data found.  Updated Vital Signs BP (!) 133/90 (BP Location: Left Arm)   Pulse 87   Temp 97.9 F (36.6 C) (Oral)   Resp 18   SpO2 96%   Visual Acuity Right Eye Distance:   Left Eye Distance:   Bilateral Distance:    Right Eye Near:   Left Eye Near:    Bilateral Near:     Physical Exam Vitals and nursing note reviewed.  Constitutional:      Appearance: She is not ill-appearing or toxic-appearing.  HENT:     Head: Normocephalic and atraumatic.     Right Ear: Hearing, tympanic membrane, ear canal and external ear normal.     Left Ear: Hearing, tympanic membrane, ear canal and external ear normal.     Nose: Nose normal.  Mouth/Throat:     Lips: Pink.     Mouth: Mucous membranes are moist. No injury.     Tongue: No lesions. Tongue does not deviate from midline.     Palate: No mass and lesions.     Pharynx: Uvula midline. Oropharyngeal exudate and posterior oropharyngeal erythema present. No pharyngeal swelling or uvula swelling.     Tonsils: No tonsillar exudate or tonsillar abscesses. 1+ on the right. 1+ on the left.     Comments: Bilateral tonsillar swelling with patchy white exudate to the posterior oropharynx/tonsils.  Significant erythema to the posterior oropharynx.  Phonation is normal. Eyes:     General: Lids are normal. Vision grossly intact. Gaze aligned appropriately.     Extraocular Movements: Extraocular movements intact.     Conjunctiva/sclera: Conjunctivae normal.  Cardiovascular:     Rate and Rhythm: Normal rate and regular rhythm.     Heart sounds: Normal heart sounds, S1 normal and S2 normal.  Pulmonary:     Effort: Pulmonary effort is normal. No respiratory distress.     Breath sounds: Normal breath sounds and air entry.  Musculoskeletal:     Cervical back: Neck supple.  Skin:    General: Skin is warm and dry.     Capillary Refill: Capillary refill takes less than 2 seconds.     Findings: No rash.  Neurological:     General: No focal deficit present.     Mental Status: She is alert and oriented to person, place, and time. Mental status is at baseline.     Cranial Nerves: No dysarthria or facial asymmetry.  Psychiatric:        Mood and Affect: Mood normal.        Speech: Speech normal.        Behavior: Behavior normal.        Thought Content: Thought content normal.        Judgment: Judgment normal.      UC Treatments / Results  Labs (all labs ordered are listed, but only abnormal results are displayed) Labs Reviewed  POCT RAPID STREP A, ED / UC  POC URINE PREG, ED    EKG   Radiology No results found.  Procedures Procedures (including critical care  time)  Medications Ordered in UC Medications  ibuprofen (ADVIL) tablet 800 mg (has no administration in time range)    Initial Impression / Assessment and Plan / UC Course  I have reviewed the triage vital signs and the nursing notes.  Pertinent labs & imaging results that were available during my care of the patient were reviewed by me and considered in my medical decision making (see chart for details).   1.  Tonsillitis, negative pregnancy test Group A strep testing ordered by nursing staff is negative in clinic, however patient has had amoxicillin twice daily for the last 3 days likely contributing to possible false negative.  Symptoms may be viral in nature, however she does meet [facility] criteria for likely septic coccal pharyngitis.  Amoxicillin twice daily for 7 days sent to pharmacy to finish out 10-day course of antibiotic.  Diflucan sent as well to be taken as directed for vaginal yeast infection related to antibiotic use.  Urine pregnancy test is negative.  Therefore, she may have ibuprofen 800 mg in clinic.  May have ibuprofen 600 mg every 6 hours and/or Tylenol 1000 mg every 6 hours as needed for throat pain, fever, and chills.  Salt water gargles recommended.  Change toothbrush after 2 to  3 days to prevent reinfection.   Discussed physical exam and available lab work findings in clinic with patient.  Counseled patient regarding appropriate use of medications and potential side effects for all medications recommended or prescribed today. Discussed red flag signs and symptoms of worsening condition,when to call the PCP office, return to urgent care, and when to seek higher level of care in the emergency department. Patient verbalizes understanding and agreement with plan. All questions answered. Patient discharged in stable condition.    Final Clinical Impressions(s) / UC Diagnoses   Final diagnoses:  Negative pregnancy test  Tonsillitis     Discharge Instructions       Continue taking amoxicillin twice daily for the next 7 days. Strep test was negative here, however I believe that is because you have recently had amoxicillin. Your pregnancy test was also negative in the clinic. You may take Tylenol and or ibuprofen as needed for throat discomfort. Perform salt water gargles every 4-6 hours as needed for throat discomfort. Take Diflucan pill for vaginal yeast infection associated with antibiotic use once today then again in 3 days.  If you develop any new or worsening symptoms or do not improve in the next 2 to 3 days, please return.  If your symptoms are severe, please go to the emergency room.  Follow-up with your primary care provider for further evaluation and management of your symptoms as well as ongoing wellness visits.  I hope you feel better!     ED Prescriptions     Medication Sig Dispense Auth. Provider   fluconazole (DIFLUCAN) 150 MG tablet Take 1 tablet (150 mg total) by mouth every 3 (three) days. 2 tablet Talbot Grumbling, FNP   amoxicillin (AMOXIL) 500 MG capsule Take 1 capsule (500 mg total) by mouth 2 (two) times daily for 7 days. 14 capsule Talbot Grumbling, FNP      PDMP not reviewed this encounter.   Talbot Grumbling, [place] 02/01/23 1025

## 2023-02-01 NOTE — Discharge Instructions (Addendum)
Continue taking amoxicillin twice daily for the next 7 days. Strep test was negative here, however I believe that is because you have recently had amoxicillin. Your pregnancy test was also negative in the clinic. You may take Tylenol and or ibuprofen as needed for throat discomfort. Perform salt water gargles every 4-6 hours as needed for throat discomfort. Take Diflucan pill for vaginal yeast infection associated with antibiotic use once today then again in 3 days.  If you develop any new or worsening symptoms or do not improve in the next 2 to 3 days, please return.  If your symptoms are severe, please go to the emergency room.  Follow-up with your primary care provider for further evaluation and management of your symptoms as well as ongoing wellness visits.  I hope you feel better!

## 2023-03-09 ENCOUNTER — Other Ambulatory Visit: Payer: Self-pay | Admitting: Internal Medicine

## 2023-03-10 LAB — CBC
HCT: 42.6 % (ref 35.0–45.0)
Hemoglobin: 14.2 g/dL (ref 11.7–15.5)
MCH: 28.7 pg (ref 27.0–33.0)
MCHC: 33.3 g/dL (ref 32.0–36.0)
MCV: 86.1 fL (ref 80.0–100.0)
MPV: 12.1 fL (ref 7.5–12.5)
Platelets: 234 10*3/uL (ref 140–400)
RBC: 4.95 10*6/uL (ref 3.80–5.10)
RDW: 12.5 % (ref 11.0–15.0)
WBC: 15.5 10*3/uL — ABNORMAL HIGH (ref 3.8–10.8)

## 2023-03-10 LAB — COMPLETE METABOLIC PANEL WITH GFR
AG Ratio: 1.6 (calc) (ref 1.0–2.5)
ALT: 16 U/L (ref 6–29)
AST: 14 U/L (ref 10–30)
Albumin: 3.9 g/dL (ref 3.6–5.1)
Alkaline phosphatase (APISO): 64 U/L (ref 31–125)
BUN: 7 mg/dL (ref 7–25)
CO2: 24 mmol/L (ref 20–32)
Calcium: 9.1 mg/dL (ref 8.6–10.2)
Chloride: 105 mmol/L (ref 98–110)
Creat: 0.68 mg/dL (ref 0.50–0.97)
Globulin: 2.4 g/dL (calc) (ref 1.9–3.7)
Glucose, Bld: 82 mg/dL (ref 65–99)
Potassium: 4 mmol/L (ref 3.5–5.3)
Sodium: 137 mmol/L (ref 135–146)
Total Bilirubin: 0.2 mg/dL (ref 0.2–1.2)
Total Protein: 6.3 g/dL (ref 6.1–8.1)
eGFR: 119 mL/min/{1.73_m2} (ref >=60)

## 2023-03-10 LAB — LIPID PANEL
Cholesterol: 145 mg/dL (ref <200)
HDL: 37 mg/dL — ABNORMAL LOW (ref >=50)
LDL Cholesterol (Calc): 89 mg/dL (calc)
Non-HDL Cholesterol (Calc): 108 mg/dL (calc) (ref <130)
Total CHOL/HDL Ratio: 3.9 (calc) (ref <5.0)
Triglycerides: 96 mg/dL (ref <150)

## 2023-03-10 LAB — VITAMIN D 25 HYDROXY (VIT D DEFICIENCY, FRACTURES): Vit D, 25-Hydroxy: 24 ng/mL — ABNORMAL LOW (ref 30–100)

## 2023-03-10 LAB — TSH: TSH: 0.74 mIU/L

## 2023-03-31 ENCOUNTER — Encounter (HOSPITAL_BASED_OUTPATIENT_CLINIC_OR_DEPARTMENT_OTHER): Payer: Self-pay | Admitting: General Surgery

## 2023-04-01 ENCOUNTER — Encounter (HOSPITAL_BASED_OUTPATIENT_CLINIC_OR_DEPARTMENT_OTHER): Payer: Self-pay | Admitting: General Surgery

## 2023-04-01 ENCOUNTER — Ambulatory Visit: Payer: Self-pay | Admitting: General Surgery

## 2023-04-01 NOTE — Progress Notes (Addendum)
Spoke w/ via phone for pre-op interview--- pt Lab needs dos----  urine preg (per anes)             Lab results------ no COVID test -----patient states asymptomatic no test needed Arrive at ------- 1330 on 04-06-2023 NPO after MN NO Solid Food.  Clear liquids from MN until--- 1230 Med rec completed Medications to take morning of surgery ----- if needed may take flexeril, gabapentin, imitrex Diabetic medication ----- n/a Patient instructed no nail polish to be worn day of surgery Patient instructed to bring photo id and insurance card day of surgery Patient aware to have Driver (ride ) / caregiver    for 24 hours after surgery -- family, jeremy Patient Special Instructions ----- n/a Pre-Op special Instructions -----  case just posted yesterday,  pre-op orders pending,  called and with nicole , triage nurse at dr Shel[facility] office and requested orders. Patient verbalized understanding of instructions that were given at this phone interview. Patient denies shortness of breath, chest pain, fever, cough at this phone interview.

## 2023-04-06 ENCOUNTER — Ambulatory Visit (HOSPITAL_BASED_OUTPATIENT_CLINIC_OR_DEPARTMENT_OTHER)
Admission: RE | Admit: 2023-04-06 | Discharge: 2023-04-06 | Disposition: A | Discharge status: Home / Self Care | Payer: BC Managed Care – PPO | Source: Ambulatory Visit | Attending: General Surgery | Admitting: General Surgery

## 2023-04-06 ENCOUNTER — Ambulatory Visit (HOSPITAL_BASED_OUTPATIENT_CLINIC_OR_DEPARTMENT_OTHER): Payer: BC Managed Care – PPO | Admitting: Anesthesiology

## 2023-04-06 ENCOUNTER — Other Ambulatory Visit: Payer: Self-pay

## 2023-04-06 ENCOUNTER — Encounter (HOSPITAL_BASED_OUTPATIENT_CLINIC_OR_DEPARTMENT_OTHER): Payer: Self-pay | Admitting: General Surgery

## 2023-04-06 ENCOUNTER — Encounter (HOSPITAL_BASED_OUTPATIENT_CLINIC_OR_DEPARTMENT_OTHER)
Admission: RE | Disposition: A | Discharge status: Home / Self Care | Payer: Self-pay | Source: Ambulatory Visit | Attending: General Surgery

## 2023-04-06 DIAGNOSIS — K429 Umbilical hernia without obstruction or gangrene: Secondary | ICD-10-CM | POA: Diagnosis present

## 2023-04-06 DIAGNOSIS — Z01818 Encounter for other preprocedural examination: Secondary | ICD-10-CM

## 2023-04-06 DIAGNOSIS — F17210 Nicotine dependence, cigarettes, uncomplicated: Secondary | ICD-10-CM | POA: Insufficient documentation

## 2023-04-06 HISTORY — DX: Unspecified mood (affective) disorder: F39

## 2023-04-06 HISTORY — DX: Cervicalgia: M54.2

## 2023-04-06 HISTORY — DX: Other intervertebral disc degeneration, lumbar region: M51.36

## 2023-04-06 HISTORY — PX: UMBILICAL HERNIA REPAIR: SHX196

## 2023-04-06 HISTORY — DX: Personal history of other infectious and parasitic diseases: Z86.19

## 2023-04-06 HISTORY — DX: Other intervertebral disc degeneration, lumbar region without mention of lumbar back pain or lower extremity pain: M51.369

## 2023-04-06 HISTORY — DX: Umbilical hernia without obstruction or gangrene: K42.9

## 2023-04-06 HISTORY — DX: Sciatica, left side: M54.32

## 2023-04-06 LAB — POCT PREGNANCY, URINE: Preg Test, Ur: NEGATIVE

## 2023-04-06 SURGERY — REPAIR, HERNIA, UMBILICAL, ADULT
Anesthesia: General | Site: Abdomen

## 2023-04-06 MED ORDER — ACETAMINOPHEN 500 MG PO TABS
1000.0000 mg | ORAL_TABLET | ORAL | Status: AC
  Administered 2023-04-06: 1000 mg via ORAL

## 2023-04-06 MED ORDER — MIDAZOLAM HCL 2 MG/2ML IJ SOLN
INTRAMUSCULAR | Status: AC
  Filled 2023-04-06: qty 2

## 2023-04-06 MED ORDER — OXYCODONE HCL 5 MG PO TABS
5.0000 mg | ORAL_TABLET | Freq: Once | ORAL | Status: AC
  Administered 2023-04-06: 5 mg via ORAL

## 2023-04-06 MED ORDER — FENTANYL CITRATE (PF) 100 MCG/2ML IJ SOLN
INTRAMUSCULAR | Status: DC | PRN
  Administered 2023-04-06: 100 ug via INTRAVENOUS

## 2023-04-06 MED ORDER — 0.9 % SODIUM CHLORIDE (POUR BTL) OPTIME
TOPICAL | Status: DC | PRN
  Administered 2023-04-06: 500 mL

## 2023-04-06 MED ORDER — LACTATED RINGERS IV SOLN: INTRAVENOUS | Status: DC

## 2023-04-06 MED ORDER — PROPOFOL 10 MG/ML IV BOLUS
INTRAVENOUS | Status: DC | PRN
  Administered 2023-04-06: 150 mg via INTRAVENOUS

## 2023-04-06 MED ORDER — DEXAMETHASONE SODIUM PHOSPHATE 10 MG/ML IJ SOLN
INTRAMUSCULAR | Status: AC
  Filled 2023-04-06: qty 1

## 2023-04-06 MED ORDER — PROPOFOL 10 MG/ML IV BOLUS
INTRAVENOUS | Status: AC
  Filled 2023-04-06: qty 20

## 2023-04-06 MED ORDER — FENTANYL CITRATE (PF) 100 MCG/2ML IJ SOLN
INTRAMUSCULAR | Status: AC
  Filled 2023-04-06: qty 2

## 2023-04-06 MED ORDER — CHLORHEXIDINE GLUCONATE CLOTH 2 % EX PADS: 6.0000 | MEDICATED_PAD | Freq: Once | CUTANEOUS | Status: DC

## 2023-04-06 MED ORDER — CEFAZOLIN SODIUM-DEXTROSE 2-4 GM/100ML-% IV SOLN
2.0000 g | INTRAVENOUS | Status: AC
  Administered 2023-04-06: 2 g via INTRAVENOUS

## 2023-04-06 MED ORDER — ONDANSETRON HCL 4 MG/2ML IJ SOLN
INTRAMUSCULAR | Status: AC
  Filled 2023-04-06: qty 2

## 2023-04-06 MED ORDER — OXYCODONE HCL 5 MG PO TABS: 5.0000 mg | ORAL_TABLET | Freq: Four times a day (QID) | ORAL | 0 refills | Status: AC | PRN

## 2023-04-06 MED ORDER — SUGAMMADEX SODIUM 200 MG/2ML IV SOLN
INTRAVENOUS | Status: DC | PRN
  Administered 2023-04-06: 200 mg via INTRAVENOUS

## 2023-04-06 MED ORDER — LIDOCAINE HCL (PF) 2 % IJ SOLN
INTRAMUSCULAR | Status: AC
  Filled 2023-04-06: qty 5

## 2023-04-06 MED ORDER — BUPIVACAINE LIPOSOME 1.3 % IJ SUSP: 20.0000 mL | Freq: Once | INTRAMUSCULAR | Status: DC

## 2023-04-06 MED ORDER — ROCURONIUM BROMIDE 10 MG/ML (PF) SYRINGE
PREFILLED_SYRINGE | INTRAVENOUS | Status: DC | PRN
  Administered 2023-04-06: 90 mg via INTRAVENOUS

## 2023-04-06 MED ORDER — KETOROLAC TROMETHAMINE 30 MG/ML IJ SOLN
INTRAMUSCULAR | Status: AC
  Filled 2023-04-06: qty 1

## 2023-04-06 MED ORDER — ROCURONIUM BROMIDE 10 MG/ML (PF) SYRINGE
PREFILLED_SYRINGE | INTRAVENOUS | Status: AC
  Filled 2023-04-06: qty 10

## 2023-04-06 MED ORDER — LACTATED RINGERS IV SOLN: INTRAVENOUS | Status: DC | PRN

## 2023-04-06 MED ORDER — ACETAMINOPHEN 500 MG PO TABS
ORAL_TABLET | ORAL | Status: AC
  Filled 2023-04-06: qty 2

## 2023-04-06 MED ORDER — DEXAMETHASONE SODIUM PHOSPHATE 10 MG/ML IJ SOLN
INTRAMUSCULAR | Status: DC | PRN
  Administered 2023-04-06: 10 mg via INTRAVENOUS

## 2023-04-06 MED ORDER — BUPIVACAINE LIPOSOME 1.3 % IJ SUSP
INTRAMUSCULAR | Status: DC | PRN
  Administered 2023-04-06: 50 mL

## 2023-04-06 MED ORDER — FENTANYL CITRATE (PF) 100 MCG/2ML IJ SOLN: 25.0000 ug | INTRAMUSCULAR | Status: DC | PRN

## 2023-04-06 MED ORDER — MIDAZOLAM HCL 2 MG/2ML IJ SOLN
INTRAMUSCULAR | Status: DC | PRN
  Administered 2023-04-06: 2 mg via INTRAVENOUS

## 2023-04-06 MED ORDER — OXYCODONE HCL 5 MG PO TABS
ORAL_TABLET | ORAL | Status: AC
  Filled 2023-04-06: qty 1

## 2023-04-06 MED ORDER — DEXMEDETOMIDINE HCL IN NACL 80 MCG/20ML IV SOLN
INTRAVENOUS | Status: DC | PRN
  Administered 2023-04-06: 8 ug via INTRAVENOUS

## 2023-04-06 MED ORDER — LIDOCAINE 2% (20 MG/ML) 5 ML SYRINGE
INTRAMUSCULAR | Status: DC | PRN
  Administered 2023-04-06: 100 mg via INTRAVENOUS

## 2023-04-06 MED ORDER — IBUPROFEN 800 MG PO TABS: 800.0000 mg | ORAL_TABLET | Freq: Three times a day (TID) | ORAL | 0 refills | Status: AC | PRN

## 2023-04-06 MED ORDER — CEFAZOLIN SODIUM-DEXTROSE 2-4 GM/100ML-% IV SOLN
INTRAVENOUS | Status: AC
  Filled 2023-04-06: qty 100

## 2023-04-06 MED ORDER — ONDANSETRON HCL 4 MG/2ML IJ SOLN
INTRAMUSCULAR | Status: DC | PRN
  Administered 2023-04-06: 4 mg via INTRAVENOUS

## 2023-04-06 SURGICAL SUPPLY — 50 items
ADH SKN CLS APL DERMABOND .7 (GAUZE/BANDAGES/DRESSINGS) ×1
APL PRP STRL LF DISP 70% ISPRP (MISCELLANEOUS) ×1
BLADE CLIPPER SENSICLIP SURGIC (BLADE) IMPLANT
BLADE SURG 15 STRL LF DISP TIS (BLADE) ×2 IMPLANT
BLADE SURG 15 STRL SS (BLADE) ×1
CELLS DAT CNTRL 66122 CELL SVR (MISCELLANEOUS) IMPLANT
CHLORAPREP W/TINT 26 (MISCELLANEOUS) ×2 IMPLANT
COVER BACK TABLE 60X90IN (DRAPES) ×2 IMPLANT
COVER MAYO STAND STRL (DRAPES) ×2 IMPLANT
DERMABOND ADVANCED .7 DNX12 (GAUZE/BANDAGES/DRESSINGS) ×2 IMPLANT
DRAPE LAPAROSCOPIC ABDOMINAL (DRAPES) ×2 IMPLANT
DRAPE UTILITY XL STRL (DRAPES) ×2 IMPLANT
ELECT COATED BLADE 2.86 ST (ELECTRODE) IMPLANT
ELECT REM PT RETURN 9FT ADLT (ELECTROSURGICAL) ×1
ELECTRODE REM PT RTRN 9FT ADLT (ELECTROSURGICAL) ×2 IMPLANT
GAUZE 4X4 16PLY ~~LOC~~+RFID DBL (SPONGE) ×2 IMPLANT
GLOVE BIOGEL PI IND STRL 7.0 (GLOVE) ×2 IMPLANT
GLOVE SURG SS PI 6.5 STRL IVOR (GLOVE) IMPLANT
GLOVE SURG SS PI 7.0 STRL IVOR (GLOVE) ×2 IMPLANT
GOWN STRL REUS W/ TWL LRG LVL3 (GOWN DISPOSABLE) IMPLANT
GOWN STRL REUS W/TWL LRG LVL3 (GOWN DISPOSABLE) ×6 IMPLANT
KIT TURNOVER CYSTO (KITS) ×2 IMPLANT
MESH BARD SOFT 6X6IN (Mesh General) IMPLANT
NDL HYPO 25X1 1.5 SAFETY (NEEDLE) ×2 IMPLANT
NEEDLE HYPO 25X1 1.5 SAFETY (NEEDLE) IMPLANT
NS IRRIG 500ML POUR BTL (IV SOLUTION) ×2 IMPLANT
PACK BASIN DAY SURGERY FS (CUSTOM PROCEDURE TRAY) ×2 IMPLANT
PENCIL SMOKE EVACUATOR (MISCELLANEOUS) ×2 IMPLANT
RETRACTOR WND ALEXIS 18 MED (MISCELLANEOUS) IMPLANT
RTRCTR WOUND ALEXIS 18CM MED (MISCELLANEOUS)
RTRCTR WOUND ALEXIS 18CM SML (INSTRUMENTS)
SAVER CELL AAL HAEMONETICS (INSTRUMENTS) IMPLANT
SLEEVE SCD COMPRESS KNEE MED (STOCKING) ×2 IMPLANT
SPIKE FLUID TRANSFER (MISCELLANEOUS) IMPLANT
SPONGE T-LAP 4X18 ~~LOC~~+RFID (SPONGE) ×2 IMPLANT
SUT MNCRL AB 4-0 PS2 18 (SUTURE) ×2 IMPLANT
SUT NOVA NAB GS-21 0 18 T12 DT (SUTURE) ×2 IMPLANT
SUT PDS AB 0 CT1 36 (SUTURE) IMPLANT
SUT PROLENE 0 CT 1 CR/8 (SUTURE) IMPLANT
SUT VIC AB 0 SH 27 (SUTURE) IMPLANT
SUT VIC AB 2-0 SH 27 (SUTURE)
SUT VIC AB 2-0 SH 27XBRD (SUTURE) IMPLANT
SUT VIC AB 3-0 SH 18 (SUTURE) IMPLANT
SUT VIC AB 3-0 SH 27 (SUTURE) ×1
SUT VIC AB 3-0 SH 27X BRD (SUTURE) ×2 IMPLANT
SYR BULB IRRIG 60ML STRL (SYRINGE) ×2 IMPLANT
SYR CONTROL 10ML LL (SYRINGE) ×2 IMPLANT
TOWEL OR 17X24 6PK STRL BLUE (TOWEL DISPOSABLE) ×4 IMPLANT
TUBE CONNECTING 12X1/4 (SUCTIONS) ×2 IMPLANT
YANKAUER SUCT BULB TIP NO VENT (SUCTIONS) ×2 IMPLANT

## 2023-04-06 NOTE — Anesthesia Procedure Notes (Signed)
Procedure Name: Intubation Date/Time: 04/06/2023 3:03 PM  Performed by: Francie Massing, CRNAPre-anesthesia Checklist: Patient identified, Emergency Drugs available, Suction available and Patient being monitored Patient Re-evaluated:Patient Re-evaluated prior to induction Oxygen Delivery Method: Circle system utilized Preoxygenation: Pre-oxygenation with 100% oxygen Induction Type: IV induction Ventilation: Mask ventilation without difficulty Laryngoscope Size: Miller and 2 Grade View: Grade I Tube type: Oral Number of attempts: 1 Airway Equipment and Method: Stylet and Oral airway Placement Confirmation: ETT inserted through vocal cords under direct vision, positive ETCO2 and breath sounds checked- equal and bilateral Secured at: 21 cm Tube secured with: Tape Dental Injury: Teeth and Oropharynx as per pre-operative assessment

## 2023-04-06 NOTE — Anesthesia Postprocedure Evaluation (Signed)
Anesthesia Post Note  Patient: Amber Ray  Procedure(s) Performed: OPEN UMBILICAL HERNIA REPAIR WITH MESH (Abdomen)     Patient location during evaluation: PACU Anesthesia Type: General Level of consciousness: awake and alert Pain management: pain level controlled Vital Signs Assessment: post-procedure vital signs reviewed and stable Respiratory status: spontaneous breathing, nonlabored ventilation and respiratory function stable Cardiovascular status: blood pressure returned to baseline and stable Postop Assessment: no apparent nausea or vomiting Anesthetic complications: no   No notable events documented.  Last Vitals:  Vitals:   04/06/23 1648 04/06/23 1718  BP:  123/87  Pulse: 83 75  Resp: 16 18  Temp:  36.8 C  SpO2: 97% 100%    Last Pain:  Vitals:   04/06/23 1718  TempSrc:   PainSc: 6                  Lowella Curb

## 2023-04-06 NOTE — H&P (Signed)
Chief Complaint: New Consultation (Umbilical hernia)  History of Present Illness: Amber Ray is a 32 y.o. female who is seen today as an office consultation for evaluation of New Consultation (Umbilical hernia)  Amber Ray had a laparoscopy in 2012 and has noticed a small bulge since. It has slowly gotten larger. Amber Ray has intermittent pain with it. Amber Ray used to be able to reduce the hernia but now it is painful to touch all the time. Amber Ray denies nausea or vomiting. The pain is worse when Amber Ray gets bloated.  Review of Systems: A complete review of systems was obtained from the patient. I have reviewed this information and discussed as appropriate with the patient. See HPI as well for other ROS.  Review of Systems  Constitutional: Negative.  HENT: Negative.  Eyes: Negative.  Respiratory: Negative.  Cardiovascular: Negative.  Gastrointestinal: Positive for abdominal pain.  Genitourinary: Negative.  Musculoskeletal: Negative.  Skin: Negative.  Neurological: Negative.  Endo/Heme/Allergies: Negative.  Psychiatric/Behavioral: Negative.    Medical History: Past Medical History:  Diagnosis Date  Anxiety  Hypertension   There is no problem list on file for this patient.  Past Surgical History:  Procedure Laterality Date  REMOVAL OVARIAN CYST 2013  Hysteroscopy with d & c 2016    No Known Allergies  Current Outpatient Medications on File Prior to Visit  Medication Sig Dispense Refill  amitriptyline (ELAVIL) 25 MG tablet 1 tablet at bedtime Orally Once a day for 30 day(s)  cyclobenzaprine (FLEXERIL) 10 MG tablet 1 tablet Orally Three times a day for 30 days  gabapentin (NEURONTIN) 300 MG capsule 1 capsule Orally 2-3 times daily for 30 days  MOBIC 7.5 mg tablet 1 tablet Orally Once a day for 30 day(s)  phentermine (ADIPEX-P) 37.5 mg tablet Take 37.5 mg by mouth every morning   No current facility-administered medications on file prior to visit.   History reviewed. No pertinent family  history.   Social History   Tobacco Use  Smoking Status Every Day  Current packs/day: 0.50  Types: Cigarettes  Smokeless Tobacco Never    Social History   Socioeconomic History  Marital status: Single  Tobacco Use  Smoking status: Every Day  Current packs/day: 0.50  Types: Cigarettes  Smokeless tobacco: Never  Substance and Sexual Activity  Alcohol use: Never  Drug use: Yes   Objective:   Vitals:  03/11/23 1044  BP: 129/88  Pulse: 93  Temp: 36.8 C (98.2 F)  SpO2: 97%  Weight: 96.5 kg (212 lb 12.8 oz)  Height: 162.6 cm (5\' 4" )  PainSc: 0-No pain  PainLoc: Abdomen   Body mass index is 36.53 kg/m.  Physical Exam Constitutional:  Appearance: Normal appearance.  HENT:  Head: Normocephalic and atraumatic.  Pulmonary:  Effort: Pulmonary effort is normal.  Abdominal:  Comments: Small umbilical hernia, tender to touch, no other palpable hernias  Musculoskeletal:  General: Normal range of motion.  Cervical back: Normal range of motion.  Neurological:  General: No focal deficit present.  Mental Status: Amber Ray is alert and oriented to person, place, and time. Mental status is at baseline.  Psychiatric:  Mood and Affect: Mood normal.  Behavior: Behavior normal.  Thought Content: Thought content normal.     Labs, Imaging and Diagnostic Testing: I reviewed notes by Reita May  Assessment and Plan:   Diagnoses and all orders for this visit:  Umbilical hernia without obstruction or gangrene  Tobacco abuse   The patient has a symptomatic reducible hernia. We discussed the etiology of his hernia,  the risk of it enlarging, incarceration, obstruction, strangulation, and that it is unlikely to get smaller or better on its own. We discussed operative options of laparoscopic vs open repair with mesh including the risks of recurrence, injury to intestines or abdominal organs, or chronic pain associated with mesh. We decided to proceed with open umbilical hernia  repair as outpatient after smoking cessation.

## 2023-04-06 NOTE — Op Note (Signed)
PATIENT:  Amber Ray  32 y.o. female  PRE-OPERATIVE DIAGNOSIS:  umbilical hernia  POST-OPERATIVE DIAGNOSIS:  umbilical hernia  PROCEDURE:  Procedure(s): OPEN UMBILICAL HERNIA REPAIR WITH MESH   SURGEON:  Surgeon(s): Sasha Rueth, De Blanch, MD  ASSISTANT: none   ANESTHESIA:   local and general  Indications for procedure: Amber Ray is a 32 y.o. year old female with symptoms of abdominal pain and supraumbilical swelling. She presents for hernia repair.  Description of procedure: The patient was brought into the operative suite. Anesthesia was administered with General endotracheal anesthesia. WHO checklist was applied. The patient was then placed in supine. The area was prepped and draped in the usual sterile fashion.  Next the infraumbilical skin was anesthetized with Marcaine/Exparel mix. A semilunar infraumbilical incision was made. Cautery and blunt dissection was used to dissect down to the fascia. The hernia sac was dissected free from surrounding tissues in 360 degrees. The umbilical skin was dissected free of the hernia sac with cautery. The hernia sac contained omentum and was opened and contents reduced into the peritoneal space.  The hernia defect was 3 cm in diameter. The peritoneum was dissected free of the abdominal wall in all direction. The peritoneum was repaired with 3-0 vicryl in 3 locations. Marcaine/Exparel mix was injected into the rectus muscles. A 10 x 10 cm Bard soft mesh was placed into the preperitoneal space and sutured to the fascia with interrupted 0 Novafils in 4 locations. The fascial defect was then primarily closed with interrupted 0 PDS sutures. The umbilical skin was sutured to the fascia with a 3-0 vicryl. The deep dermal space was closed with a 3-0 vicryl.  The skin was closed with a 4-0 monocryl subcuticular suture. Dermabond was put in place for dressing. The patient awoke from anesthesia and was brought to pacu in stable condition. All counts were  correct.  Findings: 3 cm umbilical hernia  Specimen: none  Implant: 10 x 10 cm Bard Soft mesh  Blood loss: 20 ml  Local anesthesia: 50 ml Marcaine/Exparel mix  Complications: none  PLAN OF CARE: Discharge to home after PACU  PATIENT DISPOSITION:  PACU - hemodynamically stable.  Feliciana Rossetti, M.D. General, Bariatric, & Minimally Invasive Surgery [facility], [place]  04/06/2023 4:05 PM

## 2023-04-06 NOTE — Transfer of Care (Signed)
Immediate Anesthesia Transfer of Care Note  Patient: Amber Ray  Procedure(s) Performed: Procedure(s) (LRB): OPEN UMBILICAL HERNIA REPAIR WITH MESH (N/A)  Patient Location: PACU  Anesthesia Type: General  Level of Consciousness: awake, oriented, sedated and patient cooperative  Airway & Oxygen Therapy: Patient Spontanous Breathing and Patient connected to face mask oxygen  Post-op Assessment: Report given to PACU RN and Post -op Vital signs reviewed and stable  Post vital signs: Reviewed and stable  Complications: No apparent anesthesia complications Last Vitals:  Vitals Value Taken Time  BP 123/94 04/06/23 1615  Temp 36.7 C 04/06/23 1615  Pulse 85 04/06/23 1620  Resp 26 04/06/23 1620  SpO2 89 % 04/06/23 1620  Vitals shown include unvalidated device data.  Last Pain:  Vitals:   04/06/23 1615  TempSrc:   PainSc: 0-No pain      Patients Stated Pain Goal: 4 (04/06/23 1615)  Complications: No notable events documented.

## 2023-04-06 NOTE — Anesthesia Preprocedure Evaluation (Addendum)
Anesthesia Evaluation  Patient identified by MRN, date of birth, ID band Patient awake    Reviewed: Allergy & Precautions, NPO status , Patient's Chart, lab work & pertinent test results  History of Anesthesia Complications Negative for: history of anesthetic complications  Airway Mallampati: II  TM Distance: >3 FB Neck ROM: Full    Dental no notable dental hx. (+) Dental Advisory Given   Pulmonary Current Smoker and Patient abstained from smoking.   Pulmonary exam normal breath sounds clear to auscultation       Cardiovascular negative cardio ROS Normal cardiovascular exam Rhythm:Regular Rate:Normal     Neuro/Psych  PSYCHIATRIC DISORDERS Anxiety Depression Bipolar Disorder   negative neurological ROS     GI/Hepatic negative GI ROS, Neg liver ROS,,,  Endo/Other  negative endocrine ROS  PCOS  Renal/GU negative Renal ROS  negative genitourinary   Musculoskeletal negative musculoskeletal ROS (+)    Abdominal  (+) + obese  Peds negative pediatric ROS (+)  Hematology negative hematology ROS (+)   Anesthesia Other Findings   Reproductive/Obstetrics negative OB ROS                             Anesthesia Physical Anesthesia Plan  ASA: 2  Anesthesia Plan: General   Post-op Pain Management: Tylenol PO (pre-op)* and Dilaudid IV   Induction: Intravenous  PONV Risk Score and Plan: 2 and Midazolam, Ondansetron and Treatment may vary due to age or medical condition  Airway Management Planned: Oral ETT  Additional Equipment:   Intra-op Plan:   Post-operative Plan: Extubation in OR  Informed Consent: I have reviewed the patients History and Physical, chart, labs and discussed the procedure including the risks, benefits and alternatives for the proposed anesthesia with the patient or authorized representative who has indicated his/her understanding and acceptance.     Dental advisory  given  Plan Discussed with: CRNA  Anesthesia Plan Comments:         Anesthesia Quick Evaluation

## 2023-04-06 NOTE — Discharge Instructions (Addendum)
[facility] _______[facility]  UMBILICAL OR INGUINAL HERNIA REPAIR: POST OP INSTRUCTIONS  Always review your discharge instruction sheet given to you by the facility where your surgery was performed. IF YOU HAVE DISABILITY OR FAMILY LEAVE FORMS, YOU MUST BRING THEM TO THE OFFICE FOR PROCESSING.   DO NOT GIVE THEM TO YOUR DOCTOR.  1. A  prescription for pain medication may be given to you upon discharge.  Take your pain medication as prescribed, if needed.  If narcotic pain medicine is not needed, then you may take acetaminophen (Tylenol) or ibuprofen (Advil) as needed. 2. Take your usually prescribed medications unless otherwise directed. If you need a refill on your pain medication, please contact your pharmacy.  They will contact our office to request authorization. Prescriptions will not be filled after 5 pm or on week-ends. 3. You should follow a light diet the first 24 hours after arrival home, such as soup and crackers, etc.  Be sure to include lots of fluids daily.  Resume your normal diet the day after surgery. 4.Most patients will experience some swelling and bruising around the umbilicus or in the groin and scrotum.  Ice packs and reclining will help.  Swelling and bruising can take several days to resolve.  6. It is common to experience some constipation if taking pain medication after surgery.  Increasing fluid intake and taking a stool softener (such as Colace) will usually help or prevent this problem from occurring.  A mild laxative (Milk of Magnesia or Miralax) should be taken according to package directions if there are no bowel movements after 48 hours. 7. Unless discharge instructions indicate otherwise, you may remove your bandages 24-48 hours after surgery, and you may shower at that time.  You may have steri-strips (small skin tapes) in place directly over the incision.  These strips should be left on the skin for 7-10 days.  If your surgeon used skin glue on the  incision, you may shower in 24 hours.  The glue will flake off over the next 2-3 weeks.  Any sutures or staples will be removed at the office during your follow-up visit. 8. ACTIVITIES:  You may resume regular (light) daily activities beginning the next day--such as daily self-care, walking, climbing stairs--gradually increasing activities as tolerated.  You may have sexual intercourse when it is comfortable.  Refrain from any heavy lifting or straining until approved by your doctor.  a.You may drive when you are no longer taking prescription pain medication, you can comfortably wear a seatbelt, and you can safely maneuver your car and apply brakes. b.RETURN TO WORK:   _____________________________________________  9.You should see your doctor in the office for a follow-up appointment approximately 2-3 weeks after your surgery.  Make sure that you call for this appointment within a day or two after you arrive home to insure a convenient appointment time. 10.OTHER INSTRUCTIONS: _________________________    _____________________________________  WHEN TO CALL YOUR DOCTOR: Fever over 101.0 Inability to urinate Nausea and/or vomiting Extreme swelling or bruising Continued bleeding from incision. Increased pain, redness, or drainage from the incision  The clinic staff is available to answer your questions during regular business hours.  Please don't hesitate to call and ask to speak to one of the nurses for clinical concerns.  If you have a medical emergency, go to the nearest emergency room or call 911.  A surgeon from [facility] is always on call at the hospital   [address], [address],  [place], [place]  [address] ?  [address] [address], [place], [place]   [address] [phone] ? [phone] ? FAX [phone] Web site: [email or web address]  Post Anesthesia Home Care Instructions  Activity: Get plenty of rest for the remainder of the day. A responsible  individual must stay with you for 24 hours following the procedure.  For the next 24 hours, DO NOT: -Drive a car -Advertising copywriter -Drink alcoholic beverages -Take any medication unless instructed by your physician -Make any legal decisions or sign important papers.  Meals: Start with liquid foods such as gelatin or soup. Progress to regular foods as tolerated. Avoid greasy, spicy, heavy foods. If nausea and/or vomiting occur, drink only clear liquids until the nausea and/or vomiting subsides. Call your physician if vomiting continues.  Special Instructions/Symptoms: Your throat may feel dry or sore from the anesthesia or the breathing tube placed in your throat during surgery. If this causes discomfort, gargle with warm salt water. The discomfort should disappear within 24 hours.  No Tylenol until after 8:00 p.m.

## 2023-04-06 NOTE — Progress Notes (Signed)
Patient requesting to get up to bathroom.

## 2023-04-07 ENCOUNTER — Encounter (HOSPITAL_BASED_OUTPATIENT_CLINIC_OR_DEPARTMENT_OTHER): Payer: Self-pay | Admitting: General Surgery

## 2023-09-02 ENCOUNTER — Other Ambulatory Visit: Payer: Self-pay | Admitting: General Surgery

## 2023-09-02 DIAGNOSIS — R1011 Right upper quadrant pain: Secondary | ICD-10-CM

## 2023-09-15 ENCOUNTER — Ambulatory Visit
Admission: RE | Admit: 2023-09-15 | Discharge: 2023-09-15 | Disposition: A | Discharge status: Home / Self Care | Payer: BC Managed Care – PPO | Source: Ambulatory Visit | Attending: General Surgery | Admitting: General Surgery

## 2023-09-15 DIAGNOSIS — R1011 Right upper quadrant pain: Secondary | ICD-10-CM

## 2023-09-15 MED ORDER — IOPAMIDOL (ISOVUE-300) INJECTION 61%
200.0000 mL | Freq: Once | INTRAVENOUS | Status: AC | PRN
  Administered 2023-09-15: 100 mL via INTRAVENOUS

## 2024-02-28 ENCOUNTER — Inpatient Hospital Stay (HOSPITAL_COMMUNITY)
Admission: AD | Admit: 2024-02-28 | Discharge: 2024-02-28 | Disposition: A | Discharge status: Home / Self Care | Attending: Obstetrics and Gynecology | Admitting: Obstetrics and Gynecology

## 2024-02-28 DIAGNOSIS — N898 Other specified noninflammatory disorders of vagina: Secondary | ICD-10-CM | POA: Insufficient documentation

## 2024-02-28 DIAGNOSIS — R102 Pelvic and perineal pain: Secondary | ICD-10-CM | POA: Diagnosis not present

## 2024-02-28 LAB — WET PREP, GENITAL
Clue Cells Wet Prep HPF POC: NONE SEEN
Sperm: NONE SEEN
Trich, Wet Prep: NONE SEEN
WBC, Wet Prep HPF POC: <10 (ref <10)
Yeast Wet Prep HPF POC: NONE SEEN

## 2024-02-28 LAB — URINALYSIS, ROUTINE W REFLEX MICROSCOPIC
Bilirubin Urine: NEGATIVE
Glucose, UA: NEGATIVE mg/dL
Hgb urine dipstick: NEGATIVE
Ketones, ur: NEGATIVE mg/dL
Leukocytes,Ua: NEGATIVE
Nitrite: NEGATIVE
Protein, ur: NEGATIVE mg/dL
Specific Gravity, Urine: 1.015 (ref 1.005–1.030)
pH: 7 (ref 5.0–8.0)

## 2024-02-28 LAB — POCT PREGNANCY, URINE: Preg Test, Ur: NEGATIVE

## 2024-02-28 NOTE — MAU Provider Note (Signed)
 Chief Complaint: Possible Pregnancy and Pelvic Pain  SUBJECTIVE HPI: Amber Ray is a 33 y.o. G0P0000 at nonpregnant, who presents to maternity admissions reporting vaginal discharge, that has become thick opaque and chunky. Recently used boric acid. Known history of PCOS and irregular menses.  She denies vaginal bleeding, urinary symptoms, h/a, dizziness, n/v, or fever/chills.    HPI  Past Medical History:  Diagnosis Date   Anxiety    Bulging lumbar disc    Cervicalgia    Depression    History of chlamydia    History of gonorrhea    History of kidney stones 08/2017   Left sided sciatica    Mood disorder (HCC)    PCOS (polycystic ovarian syndrome)    Umbilical hernia    Past Surgical History:  Procedure Laterality Date   HYSTEROSCOPY WITH D & C N/A 01/30/2015   Procedure: DILATATION AND CURETTAGE /HYSTEROSCOPY;  Surgeon: Granville Layer, MD;  Location: [facility];  Service: Gynecology;  Laterality: N/A;   LAPAROSCOPY  07/27/2012   Procedure: LAPAROSCOPY OPERATIVE;  Surgeon: Verlyn Goad, MD;  Location: [facility];  Service: Gynecology;  Laterality: N/A;   OVARIAN CYST REMOVAL  07/27/2012   Procedure: OVARIAN CYSTECTOMY;  Surgeon: Verlyn Goad, MD;  Location: [facility];  Service: Gynecology;  Laterality: Right;  Dermoid Cyst   UMBILICAL HERNIA REPAIR N/A 04/06/2023   Procedure: OPEN UMBILICAL HERNIA REPAIR WITH MESH;  Surgeon: Kinsinger, Alphonso Aschoff, MD;  Location: [facility];  Service: General;  Laterality: N/A;   Social History   Socioeconomic History   Marital status: Single    Spouse name: Not on file   Number of children: Not on file   Years of education: Not on file   Highest education level: Not on file  Occupational History   Not on file  Tobacco Use   Smoking status: Every Day    Current packs/day: 0.50    Average packs/day: 0.5 packs/day for 15.0 years (7.5 ttl pk-yrs)    Types: Cigarettes   Smokeless tobacco: Never  Vaping Use   Vaping status: Former    Devices: quit 2022  Substance and Sexual Activity   Alcohol use: Not Currently   Drug use: Not Currently    Comment: [id number] last used delta 8 -- 04/ 2024   (previously opiates and cannibus 2019)   Sexual activity: Yes    Birth control/protection: None  Other Topics Concern   Not on file  Social History Narrative   Not on file   Social Drivers of Health   Financial Resource Strain: Not on file  Food Insecurity: Not on file  Transportation Needs: Not on file  Physical Activity: Not on file  Stress: Not on file  Social Connections: Not on file  Intimate Partner Violence: Not on file   No current facility-administered medications on file prior to encounter.   Current Outpatient Medications on File Prior to Encounter  Medication Sig Dispense Refill   amitriptyline (ELAVIL) 25 MG tablet Take 25 mg by mouth at bedtime.     Cholecalciferol (VITAMIN D3) 25 MCG (1000 UT) CAPS Take 2 capsules by mouth daily.     cyclobenzaprine  (FLEXERIL ) 10 MG tablet Take 10 mg by mouth 3 (three) times daily as needed.     gabapentin (NEURONTIN) 300 MG capsule Take 300 mg by mouth 3 (three) times daily as needed.     ibuprofen  (ADVIL ) 800 MG tablet Take 1 tablet (800 mg total) by mouth every 8 (eight) hours as  needed. 30 tablet 0   oxyCODONE  (OXY IR/ROXICODONE ) 5 MG immediate release tablet Take 1 tablet (5 mg total) by mouth every 6 (six) hours as needed for severe pain. 15 tablet 0   phentermine 37.5 MG capsule Take 37.5 mg by mouth every morning.     SUMAtriptan (IMITREX) 50 MG tablet Take 50 mg by mouth every 2 (two) hours as needed for migraine. May repeat in 2 hours if headache persists or recurs.     No Known Allergies  ROS:  Pertinent positives/negatives listed above.  I have reviewed patient's Past Medical Hx, Surgical Hx, Family Hx, Social Hx, medications and allergies.   Physical Exam  Patient Vitals for the past 24 hrs:  BP Temp Pulse Resp  02/28/24 1657 119/83 98.3 F (36.8 C)  97 18   Constitutional: Well-developed, well-nourished female in no acute distress.  Cardiovascular: normal rate Respiratory: normal effort GI: Abd soft, non-tender. Pos BS x 4 MS: Extremities nontender, no edema, normal ROM Neurologic: Alert and oriented x 4.  GU: Neg CVAT.   LAB RESULTS Results for orders placed or performed during the hospital encounter of 02/28/24 (from the past 24 hours)  Pregnancy, urine POC     Status: None   Collection Time: 02/28/24  4:50 PM  Result Value Ref Range   Preg Test, Ur NEGATIVE NEGATIVE       IMAGING No results found.  MAU Management/MDM: Orders Placed This Encounter  Procedures   Wet prep, genital   Urinalysis, Routine w reflex microscopic -Urine, Clean Catch   Pregnancy, urine POC   Discharge patient Discharge disposition: 01-Home or Self Care; Discharge patient date: 02/28/2024    No orders of the defined types were placed in this encounter.   ASSESSMENT 1. Vaginal discharge   Mild intermitted suprapubic pain, with thick chunky vaginal discharge.   Will follow up results of wet prep, GC/Chlamydia, and UA and treat positives as indicated.   PLAN Discharge home with strict return precautions.  Allergies as of 02/28/2024   No Known Allergies      Medication List     TAKE these medications    amitriptyline 25 MG tablet Commonly known as: ELAVIL Take 25 mg by mouth at bedtime.   cyclobenzaprine  10 MG tablet Commonly known as: FLEXERIL  Take 10 mg by mouth 3 (three) times daily as needed.   gabapentin 300 MG capsule Commonly known as: NEURONTIN Take 300 mg by mouth 3 (three) times daily as needed.   ibuprofen  800 MG tablet Commonly known as: ADVIL  Take 1 tablet (800 mg total) by mouth every 8 (eight) hours as needed.   oxyCODONE  5 MG immediate release tablet Commonly known as: Oxy IR/ROXICODONE  Take 1 tablet (5 mg total) by mouth every 6 (six) hours as needed for severe pain.   phentermine 37.5 MG capsule Take  37.5 mg by mouth every morning.   SUMAtriptan 50 MG tablet Commonly known as: IMITREX Take 50 mg by mouth every 2 (two) hours as needed for migraine. May repeat in 2 hours if headache persists or recurs.   Vitamin D3 25 MCG (1000 UT) Caps Take 2 capsules by mouth daily.        Follow-up Information     [facility] Follow up.   Specialty: Obstetrics and Gynecology Why: As needed for OB/GYN emergencies Contact information: [address] [place]  [phone]                Charvis Lightner  [place], MD FMOB Fellow, Faculty practice [facility], [facility]  02/28/2024  5:26 PM

## 2024-02-28 NOTE — MAU Note (Signed)
.  Amber Ray is a 33 y.o. at Unknown here in MAU reporting: has not had a cycle this month. Stated she has had irregular periods in the past because of her PCOS but they have been more regular periods for a few month.  Had had lower abd cramping for a week . Re[orts a clear thick vag discharge that started last week. Did not get better so she used a boric acid solution to try to get rid of it. Today the discharge looks more like a yeast infection.   LMP: 01/10/2024 Onset of complaint: 1 week  Pain score: 6  There were no vitals filed for this visit.   FHT: n/a  Lab orders placed from triage: UPT

## 2024-02-28 NOTE — Discharge Instructions (Signed)

## 2024-02-29 LAB — GC/CHLAMYDIA PROBE AMP (~~LOC~~) NOT AT ARMC
Chlamydia: NEGATIVE
Comment: NEGATIVE
Comment: NORMAL
Neisseria Gonorrhea: NEGATIVE

## 2024-03-16 ENCOUNTER — Encounter (HOSPITAL_BASED_OUTPATIENT_CLINIC_OR_DEPARTMENT_OTHER): Admitting: Certified Nurse Midwife

## 2024-03-16 ENCOUNTER — Telehealth (HOSPITAL_BASED_OUTPATIENT_CLINIC_OR_DEPARTMENT_OTHER): Payer: Self-pay | Admitting: Certified Nurse Midwife

## 2024-03-16 NOTE — Telephone Encounter (Signed)
 Called patient and left a message to call the office back to reschedule the  missed appointment that was for today .
